# Patient Record
Sex: Male | Born: 1935
Health system: Southern US, Community
[De-identification: ages and names within clinical notes are randomized; demographics above are authoritative.]

## PROBLEM LIST (undated history)

## (undated) DIAGNOSIS — I495 Sick sinus syndrome: Secondary | ICD-10-CM

## (undated) DIAGNOSIS — T7840XA Allergy, unspecified, initial encounter: Secondary | ICD-10-CM

## (undated) DIAGNOSIS — I4891 Unspecified atrial fibrillation: Secondary | ICD-10-CM

## (undated) DIAGNOSIS — C801 Malignant (primary) neoplasm, unspecified: Secondary | ICD-10-CM

## (undated) DIAGNOSIS — I340 Nonrheumatic mitral (valve) insufficiency: Secondary | ICD-10-CM

## (undated) DIAGNOSIS — Z8619 Personal history of other infectious and parasitic diseases: Secondary | ICD-10-CM

## (undated) DIAGNOSIS — G9009 Other idiopathic peripheral autonomic neuropathy: Secondary | ICD-10-CM

## (undated) DIAGNOSIS — I351 Nonrheumatic aortic (valve) insufficiency: Secondary | ICD-10-CM

## (undated) DIAGNOSIS — E785 Hyperlipidemia, unspecified: Secondary | ICD-10-CM

## (undated) DIAGNOSIS — K219 Gastro-esophageal reflux disease without esophagitis: Secondary | ICD-10-CM

## (undated) DIAGNOSIS — I272 Pulmonary hypertension, unspecified: Secondary | ICD-10-CM

## (undated) DIAGNOSIS — I352 Nonrheumatic aortic (valve) stenosis with insufficiency: Secondary | ICD-10-CM

## (undated) DIAGNOSIS — Z950 Presence of cardiac pacemaker: Secondary | ICD-10-CM

## (undated) DIAGNOSIS — M109 Gout, unspecified: Secondary | ICD-10-CM

## (undated) DIAGNOSIS — Z7901 Long term (current) use of anticoagulants: Secondary | ICD-10-CM

## (undated) DIAGNOSIS — I350 Nonrheumatic aortic (valve) stenosis: Secondary | ICD-10-CM

## (undated) DIAGNOSIS — I071 Rheumatic tricuspid insufficiency: Secondary | ICD-10-CM

## (undated) DIAGNOSIS — C4490 Unspecified malignant neoplasm of skin, unspecified: Secondary | ICD-10-CM

## (undated) DIAGNOSIS — H532 Diplopia: Secondary | ICD-10-CM

## (undated) DIAGNOSIS — R42 Dizziness and giddiness: Secondary | ICD-10-CM

## (undated) DIAGNOSIS — I371 Nonrheumatic pulmonary valve insufficiency: Secondary | ICD-10-CM

## (undated) DIAGNOSIS — G4730 Sleep apnea, unspecified: Secondary | ICD-10-CM

## (undated) DIAGNOSIS — Z9889 Other specified postprocedural states: Secondary | ICD-10-CM

## (undated) DIAGNOSIS — I10 Essential (primary) hypertension: Secondary | ICD-10-CM

## (undated) DIAGNOSIS — M1990 Unspecified osteoarthritis, unspecified site: Secondary | ICD-10-CM

## (undated) DIAGNOSIS — I429 Cardiomyopathy, unspecified: Secondary | ICD-10-CM

## (undated) HISTORY — PX: CATARACT EXTRACTION W/ INTRAOCULAR LENS IMPLANT: SHX1309

## (undated) HISTORY — PX: COLONOSCOPY W/ POLYPECTOMY: SHX1380

## (undated) HISTORY — DX: Unspecified osteoarthritis, unspecified site: M19.90

## (undated) HISTORY — PX: COLONOSCOPY: SHX174

## (undated) HISTORY — PX: EYE SURGERY: SHX253

## (undated) HISTORY — DX: Allergy, unspecified, initial encounter: T78.40XA

## (undated) HISTORY — DX: Presence of cardiac pacemaker: Z95.0

## (undated) HISTORY — DX: Gastro-esophageal reflux disease without esophagitis: K21.9

## (undated) HISTORY — DX: Essential (primary) hypertension: I10

## (undated) HISTORY — DX: Other specified postprocedural states: Z98.890

## (undated) HISTORY — DX: Hyperlipidemia, unspecified: E78.5

## (undated) HISTORY — DX: Personal history of other infectious and parasitic diseases: Z86.19

---

## 1982-06-19 DIAGNOSIS — Z9889 Other specified postprocedural states: Secondary | ICD-10-CM

## 1982-06-19 HISTORY — PX: OTHER SURGICAL HISTORY: SHX169

## 1982-06-19 HISTORY — DX: Other specified postprocedural states: Z98.890

## 2003-06-20 HISTORY — PX: REPLACEMENT TOTAL KNEE: SUR1224

## 2008-03-07 ENCOUNTER — Inpatient Hospital Stay: Payer: Self-pay | Admitting: Cardiology

## 2008-03-07 ENCOUNTER — Other Ambulatory Visit: Payer: Self-pay

## 2008-03-10 ENCOUNTER — Other Ambulatory Visit: Payer: Self-pay

## 2008-03-11 ENCOUNTER — Other Ambulatory Visit: Payer: Self-pay

## 2008-03-24 ENCOUNTER — Ambulatory Visit: Payer: Self-pay | Admitting: Internal Medicine

## 2008-04-08 ENCOUNTER — Ambulatory Visit: Payer: Self-pay | Admitting: Internal Medicine

## 2010-06-19 HISTORY — PX: INSERT / REPLACE / REMOVE PACEMAKER: SUR710

## 2012-10-23 ENCOUNTER — Ambulatory Visit: Payer: Self-pay | Admitting: Ophthalmology

## 2013-11-12 DIAGNOSIS — I10 Essential (primary) hypertension: Secondary | ICD-10-CM | POA: Insufficient documentation

## 2013-12-11 ENCOUNTER — Ambulatory Visit: Payer: Self-pay | Admitting: Ophthalmology

## 2013-12-17 ENCOUNTER — Ambulatory Visit: Payer: Self-pay | Admitting: Ophthalmology

## 2014-10-09 NOTE — Op Note (Signed)
PATIENT NAME:  Grant Schmitt, Grant Schmitt MR#:  [id number] DATE OF BIRTH:  1929/02/12  DATE OF PROCEDURE:  10/23/2012  PROCEDURE PERFORMED:  1.  Pars plana vitrectomy of the left eye.  2.  Internal limiting membrane peel of the left eye.   PREOPERATIVE DIAGNOSES:  1.  Epiretinal membrane of the left eye.  2.  Retinal edema of the left eye.   POSTOPERATIVE DIAGNOSES: 1.  Epiretinal membrane of the left eye.  2.  Retinal edema of the left eye.   ESTIMATED BLOOD LOSS: Less than 1 mL.   PRIMARY SURGEON: Garlan Fair, M.D.   ANESTHESIA: Retro block of the left eye with monitored anesthesia care.   COMPLICATIONS: None.   INDICATIONS FOR PROCEDURE: This is a patient who presented to my office with slowly decreasing vision in the left eye to worse than 20/50. Examination revealed an epiretinal membrane with associated retinal edema. Risks, benefits and alternatives of the above procedure were discussed, and the patient wished to proceed.   DETAILS OF PROCEDURE: After informed consent was obtained, the patient was brought into the operative suite at [facility]. The patient was placed in supine position and was given a small dose of Alfenta, and a retrobulbar block was performed on the left eye by the primary surgeon without any complications. The left eye was prepped and draped in sterile manner. After a lid speculum was inserted, a 25-gauge trocar was placed inferotemporally through displaced conjunctiva in an oblique fashion 4 mm beyond the limbus. Infusion cannula was turned on and inserted through the trocar and secured in position with Steri-Strips. Two more trocars were placed in a similar fashion superotemporally and superonasally. The vitreous cutter and light pipe were introduced and a core vitrectomy was performed. The vitreous face was elevated off of the retina using suction. The peripheral vitreous was then trimmed for 360 degrees. Care was taken to avoid striking the  crystalline lens. Indocyanine green was injected onto the posterior pole and removed within 30 seconds. The internal limiting membrane was incised and then peeled for 360 degrees around the fovea for a total diameter of 2 disk diameters. Scleral depressed exam was performed for 360 degrees without any signs of breaks, tears or retinal detachment. A partial air-fluid exchange was performed. The trocars were removed, and the wounds were noted to be airtight. Pressure in the eye was confirmed to be approximately 15 mmHg. Then, 5 mg of dexamethasone was given into the inferior fornix. The lid speculum was removed. The eye was cleaned, and TobraDex was placed in the eye. A patch and shield were placed over the eye, and the patient was taken to [facility] with instructions to remain head up.    ____________________________ Teresa Pelton. Starling Manns, MD mfa:lo D: 10/23/2012 08:57:00 ET T: 10/23/2012 09:05:41 ET JOB#: [id number]  cc: Teresa Pelton. Starling Manns, MD, <Dictator> Coralee Rud MD ELECTRONICALLY SIGNED 11/01/2012 13:05

## 2014-10-10 NOTE — Op Note (Signed)
PATIENT NAME:  Grant Schmitt, Grant Schmitt MR#:  [id number] DATE OF BIRTH:  March 23, 1929  DATE OF PROCEDURE:  12/17/2013  PREOPERATIVE DIAGNOSIS:  Cataract, left eye.    POSTOPERATIVE DIAGNOSIS:  Cataract, left eye.  PROCEDURE PERFORMED:  Extracapsular cataract extraction using phacoemulsification with placement of an Alcon SN60WF, 20.0-diopter posterior chamber lens, serial #[id number].  SURGEON:  Grant Back. Zasha Belleau, MD  ASSISTANT:  None.  ANESTHESIA:  4% lidocaine and 0.75% Marcaine in a 50/50 mixture with 10 units/mL of Hylenex added, given as a peribulbar.  ANESTHESIOLOGIST: Dr. Boston Service.  COMPLICATIONS:  None.  ESTIMATED BLOOD LOSS:  Less than 1 ml.  DESCRIPTION OF PROCEDURE:  The patient was brought to the operating room and given a peribulbar block.  The patient was then prepped and draped in the usual fashion.  The vertical rectus muscles were imbricated using 5-0 silk sutures.  These sutures were then clamped to the sterile drapes as bridle sutures.  A limbal peritomy was performed extending two clock hours and hemostasis was obtained with cautery.  A partial thickness scleral groove was made at the surgical limbus and dissected anteriorly in a lamellar dissection using an Alcon crescent knife.  The anterior chamber was entered supero-temporally with a Superblade and through the lamellar dissection with a 2.6 mm keratome.  DisCoVisc was used to replace the aqueous and a continuous tear capsulorrhexis was carried out.  Hydrodissection and hydrodelineation were carried out with balanced salt and a 27 gauge canula.  The nucleus was rotated to confirm the effectiveness of the hydrodissection.  Phacoemulsification was carried out using a divide-and-conquer technique.  Total ultrasound time was  2 minutes and 23.3 seconds with an average power of  26.7 percent of the CD at 59.49.  Irrigation/aspiration was used to remove the residual cortex.  DisCoVisc was used to inflate the capsule and the  internal incision was enlarged to 3 mm with the crescent knife.  The intraocular lens was folded and inserted into the capsular bag using the AcrySert Delivery System.  Irrigation/aspiration was used to remove the residual DisCoVisc.  Miostat was injected into the anterior chamber through the paracentesis track to inflate the anterior chamber and induce miosis.  The wound was checked for leaks and none were found. The conjunctiva was closed with cautery and the bridle sutures were removed.  Two drops of 0.3% Vigamox were placed on the eye.   An eye shield was placed on the eye.  The patient was discharged to the recovery room in good condition.    ____________________________ Grant Back Jordany Russett, MD sad:ts D: 12/17/2013 11:19:38 ET T: 12/17/2013 13:03:11 ET JOB#: [id number]  cc: Remo Lipps A. Carly Applegate, MD, <Dictator> Martie Lee MD ELECTRONICALLY SIGNED 12/22/2013 13:52

## 2015-09-23 DIAGNOSIS — I10 Essential (primary) hypertension: Secondary | ICD-10-CM | POA: Diagnosis not present

## 2015-09-23 DIAGNOSIS — I495 Sick sinus syndrome: Secondary | ICD-10-CM | POA: Diagnosis not present

## 2015-09-23 DIAGNOSIS — R001 Bradycardia, unspecified: Secondary | ICD-10-CM | POA: Diagnosis not present

## 2015-09-23 DIAGNOSIS — I482 Chronic atrial fibrillation: Secondary | ICD-10-CM | POA: Diagnosis not present

## 2015-09-30 DIAGNOSIS — R001 Bradycardia, unspecified: Secondary | ICD-10-CM | POA: Insufficient documentation

## 2015-10-05 DIAGNOSIS — H3581 Retinal edema: Secondary | ICD-10-CM | POA: Diagnosis not present

## 2016-03-20 DIAGNOSIS — Z950 Presence of cardiac pacemaker: Secondary | ICD-10-CM | POA: Insufficient documentation

## 2016-03-30 DIAGNOSIS — I495 Sick sinus syndrome: Secondary | ICD-10-CM | POA: Diagnosis not present

## 2016-03-30 DIAGNOSIS — I10 Essential (primary) hypertension: Secondary | ICD-10-CM | POA: Diagnosis not present

## 2016-03-30 DIAGNOSIS — E782 Mixed hyperlipidemia: Secondary | ICD-10-CM | POA: Diagnosis not present

## 2016-03-30 DIAGNOSIS — I482 Chronic atrial fibrillation: Secondary | ICD-10-CM | POA: Diagnosis not present

## 2016-03-30 DIAGNOSIS — N400 Enlarged prostate without lower urinary tract symptoms: Secondary | ICD-10-CM | POA: Diagnosis not present

## 2016-04-04 ENCOUNTER — Telehealth: Payer: Self-pay | Admitting: Family Medicine

## 2016-04-04 DIAGNOSIS — H2511 Age-related nuclear cataract, right eye: Secondary | ICD-10-CM | POA: Diagnosis not present

## 2016-04-04 NOTE — Telephone Encounter (Signed)
Please review. Thanks!  

## 2016-04-04 NOTE — Telephone Encounter (Signed)
OK to re-establish 

## 2016-04-04 NOTE — Telephone Encounter (Signed)
Scheduled reestablish appointment/MW

## 2016-04-04 NOTE — Telephone Encounter (Signed)
Pt walked into the office and requesting records ans also stated he would like to get an appointment to reestablish with Dr Caryn Section.  Pt was last seen 04/24/2007.  Pt has be going to the [place] in [place].  Pt states he is being treated by Dr Ubaldo Glassing for high blood pressure.  Pt has [facility] [employer].  CB# [phone]

## 2016-04-25 DIAGNOSIS — I4891 Unspecified atrial fibrillation: Secondary | ICD-10-CM | POA: Insufficient documentation

## 2016-04-25 DIAGNOSIS — M1990 Unspecified osteoarthritis, unspecified site: Secondary | ICD-10-CM | POA: Insufficient documentation

## 2016-04-25 DIAGNOSIS — E7800 Pure hypercholesterolemia, unspecified: Secondary | ICD-10-CM

## 2016-04-25 DIAGNOSIS — K219 Gastro-esophageal reflux disease without esophagitis: Secondary | ICD-10-CM | POA: Insufficient documentation

## 2016-04-25 DIAGNOSIS — I10 Essential (primary) hypertension: Secondary | ICD-10-CM | POA: Insufficient documentation

## 2016-04-26 ENCOUNTER — Ambulatory Visit (INDEPENDENT_AMBULATORY_CARE_PROVIDER_SITE_OTHER): Payer: Medicare Other | Admitting: Family Medicine

## 2016-04-26 ENCOUNTER — Encounter: Payer: Self-pay | Admitting: Family Medicine

## 2016-04-26 VITALS — BP 126/72 | HR 72 | Temp 98.6°F | Resp 16 | Ht 72.0 in | Wt 200.0 lb

## 2016-04-26 DIAGNOSIS — Z8739 Personal history of other diseases of the musculoskeletal system and connective tissue: Secondary | ICD-10-CM | POA: Diagnosis not present

## 2016-04-26 DIAGNOSIS — I10 Essential (primary) hypertension: Secondary | ICD-10-CM

## 2016-04-26 DIAGNOSIS — I4891 Unspecified atrial fibrillation: Secondary | ICD-10-CM | POA: Diagnosis not present

## 2016-04-26 DIAGNOSIS — N400 Enlarged prostate without lower urinary tract symptoms: Secondary | ICD-10-CM | POA: Insufficient documentation

## 2016-04-26 NOTE — Progress Notes (Signed)
Patient: Grant Schmitt    DOB: 1929-03-22   82 y.o.   MRN: [id number] Visit Date: 04/26/2016  Today's Provider: Lelon Huh, MD   Chief Complaint  Patient presents with  . Establish Care   Subjective:    HPI Patient comes in today to establish care with the practice. He was last seen herein2008. Patient reports that he has been going to the [place]. He his also followed by cardiology (Dr. Ubaldo Glassing), who manages his hypertension and pacemaker. Patient feels well today with no other complaints. He states he just had labs done at [facility] and was called by nurse yesterday and told all labs were normal.   He also has history of gout, is on allopurinol, and reports that he has had no gout flares since being on this medications. Denies any adverse effects.     No Known Allergies   Current Outpatient Prescriptions:  .  allopurinol (ZYLOPRIM) 300 MG tablet, Take 300 mg by mouth daily., Disp: , Rfl:  .  lisinopril (PRINIVIL,ZESTRIL) 10 MG tablet, Take 1 tablet by mouth daily., Disp: , Rfl:  .  metoprolol (LOPRESSOR) 50 MG tablet, Take 1 tablet by mouth daily., Disp: , Rfl:  .  simvastatin (ZOCOR) 20 MG tablet, Take 1 tablet by mouth daily., Disp: , Rfl:  .  tamsulosin (FLOMAX) 0.4 MG CAPS capsule, Take 1 capsule by mouth daily., Disp: , Rfl:  .  XARELTO 20 MG TABS tablet, Take 1 tablet by mouth daily., Disp: , Rfl:   Review of Systems  Constitutional: Negative.   HENT: Positive for congestion and rhinorrhea.   Eyes: Negative.   Respiratory: Negative.   Cardiovascular: Negative.   Gastrointestinal: Negative.   Endocrine: Negative.   Genitourinary: Negative.   Musculoskeletal: Negative.   Skin: Negative.   Allergic/Immunologic: Negative.   Neurological: Negative.   Hematological: Negative.   Psychiatric/Behavioral: Negative.     Social History  Substance Use Topics  . Smoking status: Former Smoker    Types: Cigarettes    Quit date: 06/19/1958  . Smokeless tobacco: Never Used    . Alcohol use No   Objective:   BP 126/72 (BP Location: Right Arm, Patient Position: Sitting, Cuff Size: Normal)   Pulse 72   Temp 98.6 F (37 C)   Resp 16   Ht 6' (1.829 m)   Wt 200 lb (90.7 kg)   BMI 27.12 kg/m   Physical Exam  General Appearance:    Alert, cooperative, no distress  Eyes:    PERRL, conjunctiva/corneas clear, EOM's intact       Lungs:     Clear to auscultation bilaterally, respirations unlabored  Heart:    Regular rate and rhythm. II/VI systolic murmur RUSB.   Neurologic:   Awake, alert, oriented x 3. No apparent focal neurological           defect.            Assessment & Plan:     1. History of gout Well controlled on allopurinol  2. Atrial fibrillation, unspecified type ([place]) Asymptomatic. Compliant with medication.  Continue aggressive risk factor modification.  Doing well on Xarelto. Continue routine follow up cardiology.   3. Benign prostatic hyperplasia without lower urinary tract symptoms Well controlled.  Continue current medications.    4. Hypertension Well controlled.  Doing well with current medications.   He is to bring copy of labs recently done at [place].  States he has already had flu vaccine and  both pneumonia vaccines at [facility]

## 2016-06-23 DIAGNOSIS — R0602 Shortness of breath: Secondary | ICD-10-CM | POA: Insufficient documentation

## 2016-07-10 ENCOUNTER — Encounter: Payer: Self-pay | Admitting: Family Medicine

## 2016-07-10 ENCOUNTER — Ambulatory Visit (INDEPENDENT_AMBULATORY_CARE_PROVIDER_SITE_OTHER): Payer: Medicare PPO | Admitting: Family Medicine

## 2016-07-10 VITALS — BP 94/62 | HR 74 | Temp 97.4°F | Resp 16 | Wt 199.2 lb

## 2016-07-10 DIAGNOSIS — L259 Unspecified contact dermatitis, unspecified cause: Secondary | ICD-10-CM | POA: Diagnosis not present

## 2016-07-10 MED ORDER — TRIAMCINOLONE ACETONIDE 0.1 % EX CREA: 1.0000 "application " | TOPICAL_CREAM | Freq: Three times a day (TID) | CUTANEOUS | 0 refills | Status: DC

## 2016-07-10 NOTE — Progress Notes (Signed)
   Patient: Grant Schmitt Male    DOB: Nov 01, 1928   83 y.o.   MRN: [id number] Visit Date: 07/10/2016  Today's Provider: Vernie Murders, PA   Chief Complaint  Patient presents with  . Rash   Subjective:    Rash  This is a new problem. Episode onset: 1 week ago. The problem is unchanged. The affected locations include the chest. The rash is characterized by itchiness, pain and redness. He was exposed to nothing. Past treatments include anti-itch cream. The treatment provided no relief. His past medical history is significant for varicella.   Past Medical History:  Diagnosis Date  . Allergy   . Arthritis   . GERD (gastroesophageal reflux disease)   . H/O hemorrhoidectomy 1984  . History of chickenpox   . Hyperlipidemia   . Hypertension    Past Surgical History:  Procedure Laterality Date  . hemorrhoidectomy  1984  . REPLACEMENT TOTAL KNEE  2005   No family history on file.  No Known Allergies   Previous Medications   ALLOPURINOL (ZYLOPRIM) 300 MG TABLET    Take 300 mg by mouth daily.   LISINOPRIL (PRINIVIL,ZESTRIL) 10 MG TABLET    Take 1 tablet by mouth daily.   METOPROLOL (LOPRESSOR) 50 MG TABLET    Take 1 tablet by mouth daily.   SIMVASTATIN (ZOCOR) 20 MG TABLET    Take 1 tablet by mouth daily.   TAMSULOSIN (FLOMAX) 0.4 MG CAPS CAPSULE    Take 1 capsule by mouth daily.   XARELTO 20 MG TABS TABLET    Take 1 tablet by mouth daily.    Review of Systems  Constitutional: Negative.   Respiratory: Negative.   Cardiovascular: Negative.   Skin: Positive for rash.    Social History  Substance Use Topics  . Smoking status: Former Smoker    Types: Cigarettes    Quit date: 06/19/1958  . Smokeless tobacco: Never Used  . Alcohol use No   Objective:   BP 94/62 (BP Location: Right Arm, Patient Position: Sitting, Cuff Size: Normal)   Pulse 74   Temp 97.4 F (36.3 C) (Oral)   Resp 16   Wt 199 lb 3.2 oz (90.4 kg)   SpO2 99%   BMI 27.02 kg/m   Physical Exam    Constitutional: He is oriented to person, place, and time. He appears well-developed and well-nourished. No distress.  HENT:  Head: Normocephalic and atraumatic.  Right Ear: Hearing normal.  Left Ear: Hearing normal.  Nose: Nose normal.  Eyes: Conjunctivae and lids are normal. Right eye exhibits no discharge. Left eye exhibits no discharge. No scleral icterus.  Pulmonary/Chest: Effort normal. No respiratory distress.  Musculoskeletal: Normal range of motion.  Neurological: He is alert and oriented to person, place, and time.  Skin: Skin is intact. Rash noted. No lesion noted.  Upper chest erythematous pruritic rash. No blisters or scabbed lesions.  Psychiatric: He has a normal mood and affect. His speech is normal and behavior is normal. Thought content normal.      Assessment & Plan:     1. Contact dermatitis, unspecified contact dermatitis type, unspecified trigger Onset over the past week. No cluster of blisters or pain. Only pruritis. Will treat with Triamcinolone cream. Should wash hands frequently to limit exposure to an unknown trigger. Recheck prn. - triamcinolone cream (KENALOG) 0.1 %; Apply 1 application topically 3 (three) times daily.  Dispense: 45 g; Refill: 0

## 2016-07-10 NOTE — Patient Instructions (Signed)
Contact Dermatitis Introduction Dermatitis is redness, soreness, and swelling (inflammation) of the skin. Contact dermatitis is a reaction to certain substances that touch the skin. There are two types of contact dermatitis:  Irritant contact dermatitis. This type is caused by something that irritates your skin, such as dry hands from washing them too much. This type does not require previous exposure to the substance for a reaction to occur. This type is more common.  Allergic contact dermatitis. This type is caused by a substance that you are allergic to, such as a nickel allergy or poison ivy. This type only occurs if you have been exposed to the substance (allergen) before. Upon a repeat exposure, your body reacts to the substance. This type is less common. What are the causes? Many different substances can cause contact dermatitis. Irritant contact dermatitis is most commonly caused by exposure to:  Makeup.  Soaps.  Detergents.  Bleaches.  Acids.  Metal salts, such as nickel. Allergic contact dermatitis is most commonly caused by exposure to:  Poisonous plants.  Chemicals.  Jewelry.  Latex.  Medicines.  Preservatives in products, such as clothing. What increases the risk? This condition is more likely to develop in:  People who have jobs that expose them to irritants or allergens.  People who have certain medical conditions, such as asthma or eczema. What are the signs or symptoms? Symptoms of this condition may occur anywhere on your body where the irritant has touched you or is touched by you. Symptoms include:  Dryness or flaking.  Redness.  Cracks.  Itching.  Pain or a burning feeling.  Blisters.  Drainage of small amounts of blood or clear fluid from skin cracks. With allergic contact dermatitis, there may also be swelling in areas such as the eyelids, mouth, or genitals. How is this diagnosed? This condition is diagnosed with a medical history and  physical exam. A patch skin test may be performed to help determine the cause. If the condition is related to your job, you may need to see an occupational medicine specialist. How is this treated? Treatment for this condition includes figuring out what caused the reaction and protecting your skin from further contact. Treatment may also include:  Steroid creams or ointments. Oral steroid medicines may be needed in more severe cases.  Antibiotics or antibacterial ointments, if a skin infection is present.  Antihistamine lotion or an antihistamine taken by mouth to ease itching.  A bandage (dressing). Follow these instructions at home: [place] your skin as needed.  Apply cool compresses to the affected areas.  Try taking a bath with:  Epsom salts. Follow the instructions on the packaging. You can get these at your local pharmacy or grocery store.  Baking soda. Pour a small amount into the bath as directed by your health care provider.  Colloidal oatmeal. Follow the instructions on the packaging. You can get this at your local pharmacy or grocery store.  Try applying baking soda paste to your skin. Stir water into baking soda until it reaches a paste-like consistency.  Do not scratch your skin.  Bathe less frequently, such as every other day.  Bathe in lukewarm water. Avoid using hot water. Medicines  Take or apply over-the-counter and prescription medicines only as told by your health care provider.  If you were prescribed an antibiotic medicine, take or apply your antibiotic as told by your health care provider. Do not stop using the antibiotic even if your condition starts to improve. General instructions  Keep all follow-up visits as told by your health care provider. This is important.  Avoid the substance that caused your reaction. If you do not know what caused it, keep a journal to try to track what caused it. Write down:  What you eat.  What cosmetic  products you use.  What you drink.  What you wear in the affected area. This includes jewelry.  If you were given a dressing, take care of it as told by your health care provider. This includes when to change and remove it. Contact a health care provider if:  Your condition does not improve with treatment.  Your condition gets worse.  You have signs of infection such as swelling, tenderness, redness, soreness, or warmth in the affected area.  You have a fever.  You have new symptoms. Get help right away if:  You have a severe headache, neck pain, or neck stiffness.  You vomit.  You feel very sleepy.  You notice red streaks coming from the affected area.  Your bone or joint underneath the affected area becomes painful after the skin has healed.  The affected area turns darker.  You have difficulty breathing. This information is not intended to replace advice given to you by your health care provider. Make sure you discuss any questions you have with your health care provider. Document Released: 06/02/2000 Document Revised: 11/11/2015 Document Reviewed: 10/21/2014  2017 [employer]

## 2016-08-23 LAB — BASIC METABOLIC PANEL
BUN: 23 mg/dL — AB (ref 4–21)
Creatinine: 1 mg/dL (ref 0.6–1.3)
GLUCOSE: 83 mg/dL
POTASSIUM: 4.5 mmol/L (ref 3.4–5.3)
Sodium: 139 mmol/L (ref 137–147)

## 2016-08-23 LAB — CBC AND DIFFERENTIAL
HCT: 38 % — AB (ref 41–53)
HEMOGLOBIN: 12.5 g/dL — AB (ref 13.5–17.5)
PLATELETS: 168 10*3/uL (ref 150–399)
WBC: 6.4 10*3/mL

## 2016-08-23 LAB — HEPATIC FUNCTION PANEL
ALT: 48 U/L — AB (ref 10–40)
AST: 35 U/L (ref 14–40)

## 2016-09-01 ENCOUNTER — Ambulatory Visit (INDEPENDENT_AMBULATORY_CARE_PROVIDER_SITE_OTHER): Payer: Medicare PPO | Admitting: Family Medicine

## 2016-09-01 ENCOUNTER — Ambulatory Visit (INDEPENDENT_AMBULATORY_CARE_PROVIDER_SITE_OTHER): Payer: Medicare PPO

## 2016-09-01 ENCOUNTER — Encounter: Payer: Self-pay | Admitting: Family Medicine

## 2016-09-01 VITALS — BP 114/72 | HR 80 | Temp 97.8°F | Wt 198.0 lb

## 2016-09-01 VITALS — BP 114/72 | HR 80 | Temp 97.8°F | Ht 72.0 in | Wt 198.2 lb

## 2016-09-01 DIAGNOSIS — N400 Enlarged prostate without lower urinary tract symptoms: Secondary | ICD-10-CM

## 2016-09-01 DIAGNOSIS — I4891 Unspecified atrial fibrillation: Secondary | ICD-10-CM

## 2016-09-01 DIAGNOSIS — Z0000 Encounter for general adult medical examination without abnormal findings: Secondary | ICD-10-CM | POA: Diagnosis not present

## 2016-09-01 DIAGNOSIS — Z950 Presence of cardiac pacemaker: Secondary | ICD-10-CM | POA: Diagnosis not present

## 2016-09-01 DIAGNOSIS — Z8739 Personal history of other diseases of the musculoskeletal system and connective tissue: Secondary | ICD-10-CM | POA: Diagnosis not present

## 2016-09-01 DIAGNOSIS — E7800 Pure hypercholesterolemia, unspecified: Secondary | ICD-10-CM

## 2016-09-01 DIAGNOSIS — I10 Essential (primary) hypertension: Secondary | ICD-10-CM

## 2016-09-01 MED ORDER — TAMSULOSIN HCL 0.4 MG PO CAPS: 0.4000 mg | ORAL_CAPSULE | Freq: Every day | ORAL | 3 refills | Status: DC

## 2016-09-01 MED ORDER — SIMVASTATIN 20 MG PO TABS: 20.0000 mg | ORAL_TABLET | Freq: Every day | ORAL | 3 refills | Status: DC

## 2016-09-01 MED ORDER — LISINOPRIL 10 MG PO TABS: 10.0000 mg | ORAL_TABLET | Freq: Every day | ORAL | 3 refills | Status: DC

## 2016-09-01 MED ORDER — ALLOPURINOL 300 MG PO TABS: 300.0000 mg | ORAL_TABLET | Freq: Every day | ORAL | 3 refills | Status: DC

## 2016-09-01 MED ORDER — METOPROLOL TARTRATE 50 MG PO TABS: 50.0000 mg | ORAL_TABLET | Freq: Every day | ORAL | 3 refills | Status: DC

## 2016-09-01 NOTE — Patient Instructions (Signed)
Grant Schmitt , Thank you for taking time to come for your Medicare Wellness Visit. I appreciate your ongoing commitment to your health goals. Please review the following plan we discussed and let me know if I can assist you in the future.   Screening recommendations/referrals: Colonoscopy: N/A Recommended yearly ophthalmology/optometry visit for glaucoma screening and checkup Recommended yearly dental visit for hygiene and checkup  Vaccinations: Influenza vaccine - completed Pneumococcal vaccine - need to check [facility] records first Tdap vaccine - declined Shingles vaccine - completed  Advanced directives: Requested copy for office   Next appointment: None.  Preventive Care 36 Years and Older, Male Preventive care refers to lifestyle choices and visits with your health care provider that can promote health and wellness. What does preventive care include?  A yearly physical exam. This is also called an annual well check.  Dental exams once or twice a year.  Routine eye exams. Ask your health care provider how often you should have your eyes checked.  Personal lifestyle choices, including:  Daily care of your teeth and gums.  Regular physical activity.  Eating a healthy diet.  Avoiding tobacco and drug use.  Limiting alcohol use.  Practicing safe sex.  Taking low doses of aspirin every day.  Taking vitamin and mineral supplements as recommended by your health care provider. What happens during an annual well check? The services and screenings done by your health care provider during your annual well check will depend on your age, overall health, lifestyle risk factors, and family history of disease. Counseling  Your health care provider may ask you questions about your:  Alcohol use.  Tobacco use.  Drug use.  Emotional well-being.  Home and relationship well-being.  Sexual activity.  Eating habits.  History of falls.  Memory and ability to understand  (cognition).  Work and work Statistician. Screening  You may have the following tests or measurements:  Height, weight, and BMI.  Blood pressure.  Lipid and cholesterol levels. These may be checked every 5 years, or more frequently if you are over 71 years old.  Skin check.  Lung cancer screening. You may have this screening every year starting at age 34 if you have a 30-pack-year history of smoking and currently smoke or have quit within the past 15 years.  Fecal occult blood test (FOBT) of the stool. You may have this test every year starting at age 51.  Flexible sigmoidoscopy or colonoscopy. You may have a sigmoidoscopy every 5 years or a colonoscopy every 10 years starting at age 69.  Prostate cancer screening. Recommendations will vary depending on your family history and other risks.  Hepatitis C blood test.  Hepatitis B blood test.  Sexually transmitted disease (STD) testing.  Diabetes screening. This is done by checking your blood sugar (glucose) after you have not eaten for a while (fasting). You may have this done every 1-3 years.  Abdominal aortic aneurysm (AAA) screening. You may need this if you are a current or former smoker.  Osteoporosis. You may be screened starting at age 80 if you are at high risk. Talk with your health care provider about your test results, treatment options, and if necessary, the need for more tests. Vaccines  Your health care provider may recommend certain vaccines, such as:  Influenza vaccine. This is recommended every year.  Tetanus, diphtheria, and acellular pertussis (Tdap, Td) vaccine. You may need a Td booster every 10 years.  Zoster vaccine. You may need this after age 69.  Pneumococcal 13-valent conjugate (PCV13) vaccine. One dose is recommended after age 48.  Pneumococcal polysaccharide (PPSV23) vaccine. One dose is recommended after age 61. Talk to your health care provider about which screenings and vaccines you need and  how often you need them. This information is not intended to replace advice given to you by your health care provider. Make sure you discuss any questions you have with your health care provider. Document Released: 07/02/2015 Document Revised: 02/23/2016 Document Reviewed: 04/06/2015 Elsevier Interactive Patient Education  2017 [place] Prevention in the Home Falls can cause injuries. They can happen to people of all ages. There are many things you can do to make your home safe and to help prevent falls. What can I do on the outside of my home?  Regularly fix the edges of walkways and driveways and fix any cracks.  Remove anything that might make you trip as you walk through a door, such as a raised step or threshold.  Trim any bushes or trees on the path to your home.  Use bright outdoor lighting.  Clear any walking paths of anything that might make someone trip, such as rocks or tools.  Regularly check to see if handrails are loose or broken. Make sure that both sides of any steps have handrails.  Any raised decks and porches should have guardrails on the edges.  Have any leaves, snow, or ice cleared regularly.  Use sand or salt on walking paths during winter.  Clean up any spills in your garage right away. This includes oil or grease spills. What can I do in the bathroom?  Use night lights.  Install grab bars by the toilet and in the tub and shower. Do not use towel bars as grab bars.  Use non-skid mats or decals in the tub or shower.  If you need to sit down in the shower, use a plastic, non-slip stool.  Keep the floor dry. Clean up any water that spills on the floor as soon as it happens.  Remove soap buildup in the tub or shower regularly.  Attach bath mats securely with double-sided non-slip rug tape.  Do not have throw rugs and other things on the floor that can make you trip. What can I do in the bedroom?  Use night lights.  Make sure that you  have a light by your bed that is easy to reach.  Do not use any sheets or blankets that are too big for your bed. They should not hang down onto the floor.  Have a firm chair that has side arms. You can use this for support while you get dressed.  Do not have throw rugs and other things on the floor that can make you trip. What can I do in the kitchen?  Clean up any spills right away.  Avoid walking on wet floors.  Keep items that you use a lot in easy-to-reach places.  If you need to reach something above you, use a strong step stool that has a grab bar.  Keep electrical cords out of the way.  Do not use floor polish or wax that makes floors slippery. If you must use wax, use non-skid floor wax.  Do not have throw rugs and other things on the floor that can make you trip. What can I do with my stairs?  Do not leave any items on the stairs.  Make sure that there are handrails on both sides of the stairs and use them.  Fix handrails that are broken or loose. Make sure that handrails are as long as the stairways.  Check any carpeting to make sure that it is firmly attached to the stairs. Fix any carpet that is loose or worn.  Avoid having throw rugs at the top or bottom of the stairs. If you do have throw rugs, attach them to the floor with carpet tape.  Make sure that you have a light switch at the top of the stairs and the bottom of the stairs. If you do not have them, ask someone to add them for you. What else can I do to help prevent falls?  Wear shoes that:  Do not have high heels.  Have rubber bottoms.  Are comfortable and fit you well.  Are closed at the toe. Do not wear sandals.  If you use a stepladder:  Make sure that it is fully opened. Do not climb a closed stepladder.  Make sure that both sides of the stepladder are locked into place.  Ask someone to hold it for you, if possible.  Clearly mark and make sure that you can see:  Any grab bars or  handrails.  First and last steps.  Where the edge of each step is.  Use tools that help you move around (mobility aids) if they are needed. These include:  Canes.  Walkers.  Scooters.  Crutches.  Turn on the lights when you go into a dark area. Replace any light bulbs as soon as they burn out.  Set up your furniture so you have a clear path. Avoid moving your furniture around.  If any of your floors are uneven, fix them.  If there are any pets around you, be aware of where they are.  Review your medicines with your doctor. Some medicines can make you feel dizzy. This can increase your chance of falling. Ask your doctor what other things that you can do to help prevent falls. This information is not intended to replace advice given to you by your health care provider. Make sure you discuss any questions you have with your health care provider. Document Released: 04/01/2009 Document Revised: 11/11/2015 Document Reviewed: 07/10/2014 Elsevier Interactive Patient Education  2017 [employer].

## 2016-09-01 NOTE — Progress Notes (Signed)
Patient: Grant Schmitt, Male    DOB: 1929-04-06, 83 y.o.   MRN: [id number] Visit Date: 09/01/2016  Today's Provider: Vernie Murders, PA   Chief Complaint  Patient presents with  . Annual Exam   Subjective:    Annual physical exam Grant Schmitt is a 83 y.o. male who presents today for health maintenance and complete physical. He feels well. He reports exercising daily. He reports he is sleeping well.  ----------------------------------------------------------------- Pt is here for a physical after seeing the health nurse advisor for his AWV. Pt declines updating vaccines because he prefers to check his vaccination records from the [place]. Pt gets his medications prescribed the the [place]. Pt would like PCP to start prescribing medications so he can start using [facility]; pt reports the [place] is prescribing his medications locally only.  Pt is NOT fasting.  Review of Systems  Constitutional: Negative.   HENT: Positive for rhinorrhea. Negative for congestion, dental problem, drooling, ear discharge, ear pain, facial swelling, hearing loss, mouth sores, nosebleeds, postnasal drip, sinus pain, sinus pressure, sneezing, sore throat, tinnitus, trouble swallowing and voice change.   Eyes: Positive for visual disturbance. Negative for photophobia, pain, discharge, redness and itching.  Respiratory: Negative.   Cardiovascular: Negative.   Gastrointestinal: Negative.   Endocrine: Positive for polyuria. Negative for cold intolerance, heat intolerance, polydipsia and polyphagia.  Genitourinary: Negative.   Musculoskeletal: Positive for arthralgias. Negative for back pain, gait problem, joint swelling, myalgias, neck pain and neck stiffness.  Skin: Negative.   Allergic/Immunologic: Negative.   Neurological: Negative.   Hematological: Negative.   Psychiatric/Behavioral: Positive for sleep disturbance. Negative for agitation, behavioral problems, confusion, decreased  concentration, dysphoric mood, hallucinations, self-injury and suicidal ideas. The patient is not nervous/anxious and is not hyperactive.     Social History      He  reports that he quit smoking about 58 years ago. His smoking use included Cigarettes. He has never used smokeless tobacco. He reports that he does not drink alcohol or use drugs.       Social History   Social History  . Marital status: Widowed    Spouse name: N/A  . Number of children: N/A  . Years of education: 1   Occupational History  . Retired    Social History Main Topics  . Smoking status: Former Smoker    Types: Cigarettes    Quit date: 06/19/1958  . Smokeless tobacco: Never Used  . Alcohol use No  . Drug use: No  . Sexual activity: Not on file   Other Topics Concern  . Not on file   Social History Narrative   Lives at [facility]    Past Medical History:  Diagnosis Date  . Allergy   . Arthritis   . GERD (gastroesophageal reflux disease)   . H/O hemorrhoidectomy 1984  . History of chickenpox   . Hyperlipidemia   . Hypertension      Patient Active Problem List   Diagnosis Date Noted  . SOB (shortness of breath) 06/23/2016  . History of gout 04/26/2016  . BPH (benign prostatic hyperplasia) 04/26/2016  . Arthritis 04/25/2016  . Hypertension 04/25/2016  . Atrial fibrillation ([place]) 04/25/2016  . GERD (gastroesophageal reflux disease) 04/25/2016  . Hypercholesterolemia 04/25/2016  . Cardiac pacemaker 03/20/2016  . Symptomatic bradycardia 09/30/2015  . Benign essential hypertension 11/12/2013    Past Surgical History:  Procedure Laterality Date  . hemorrhoidectomy  1984  . REPLACEMENT TOTAL KNEE  2005  Family History        Family Status  Relation Status  . Mother Deceased at age 29  . Father Deceased at age 88        His family history is not on file.     No Known Allergies   Current Outpatient Prescriptions:  .  allopurinol (ZYLOPRIM) 300 MG tablet, Take 300 mg by mouth  daily., Disp: , Rfl:  .  Cholecalciferol (VITAMIN D3) 2000 units TABS, Take 1 tablet by mouth daily., Disp: , Rfl:  .  lisinopril (PRINIVIL,ZESTRIL) 10 MG tablet, Take 1 tablet by mouth daily., Disp: , Rfl:  .  metoprolol (LOPRESSOR) 50 MG tablet, Take 1 tablet by mouth daily., Disp: , Rfl:  .  simvastatin (ZOCOR) 20 MG tablet, Take 1 tablet by mouth daily., Disp: , Rfl:  .  tamsulosin (FLOMAX) 0.4 MG CAPS capsule, Take 1 capsule by mouth daily., Disp: , Rfl:  .  triamcinolone cream (KENALOG) 0.1 %, Apply 1 application topically 3 (three) times daily. (Patient not taking: Reported on 09/01/2016), Disp: 45 g, Rfl: 0 .  vitamin B-12 (CYANOCOBALAMIN) 1000 MCG tablet, Take 1,000 mcg by mouth daily., Disp: , Rfl:  .  XARELTO 20 MG TABS tablet, Take 1 tablet by mouth daily., Disp: , Rfl:    Patient Care Team: Birdie Sons, MD as PCP - General (Family Medicine) Teodoro Spray, MD as Consulting Physician (Cardiology) Estill Cotta, MD as Consulting Physician (Ophthalmology) Margo Common, PA as Consulting Physician (Family Medicine) Will Bonnet, MD as Referring Physician (Internal Medicine)      Objective:   Vitals: BP 114/72   Pulse 80   Temp 97.8 F (36.6 C) (Oral)   Wt 198 lb (89.8 kg)   BMI 26.85 kg/m    Vitals:   09/01/16 1008  BP: 114/72  Pulse: 80  Temp: 97.8 F (36.6 C)  TempSrc: Oral  Weight: 198 lb (89.8 kg)     Physical Exam  Constitutional: He is oriented to person, place, and time. He appears well-developed and well-nourished.  HENT:  Head: Normocephalic and atraumatic.  Right Ear: External ear normal.  Left Ear: External ear normal.  Nose: Nose normal.  Mouth/Throat: Oropharynx is clear and moist.  Eyes: Conjunctivae and EOM are normal. Pupils are equal, round, and reactive to light. Right eye exhibits no discharge.  Neck: Normal range of motion. Neck supple. No tracheal deviation present. No thyromegaly present.  Cardiovascular: Normal rate,  regular rhythm, normal heart sounds and intact distal pulses.   No murmur heard. Pulmonary/Chest: Effort normal and breath sounds normal. No respiratory distress. He has no wheezes. He has no rales. He exhibits no tenderness.  Abdominal: Soft. He exhibits no distension and no mass. There is no tenderness. There is no rebound and no guarding.  Genitourinary:  Genitourinary Comments: Examined by urologist at the [facility] last week.  Musculoskeletal: Normal range of motion. He exhibits no edema or tenderness.  Some crepitus in the left knee. Well healed scar from right knee replacement.  Lymphadenopathy:    He has no cervical adenopathy.  Neurological: He is alert and oriented to person, place, and time. He has normal reflexes. No cranial nerve deficit. He exhibits normal muscle tone. Coordination normal.  Skin: Skin is warm and dry. No rash noted. No erythema.  Psychiatric: He has a normal mood and affect. His behavior is normal. Judgment and thought content normal.   Depression Screen PHQ 2/9 Scores 09/01/2016  PHQ - 2 Score  0  PHQ- 9 Score 1    Assessment & Plan:     Routine Health Maintenance and Physical Exam  Exercise Activities and Dietary recommendations Goals    . Increase water intake          Recommend increasing water intake to 4 glasses daily.        Immunization History  Administered Date(s) Administered  . Influenza-Unspecified 02/05/2012, 03/22/2014, 03/15/2015, 03/31/2016    Health Maintenance  Topic Date Due  . PNA vac Low Risk Adult (1 of 2 - PCV13) 08/17/2017 (Originally 04/16/1996)  . TETANUS/TDAP  08/18/2026 (Originally 04/16/1948)  . INFLUENZA VACCINE  Completed     Discussed health benefits of physical activity, and encouraged him to engage in regular exercise appropriate for his age and condition.    -------------------------------------------------------------------- 1. Benign essential hypertension Well controlled with Metoprolol and Lisinopril  without side effects. No chest pains, palpitations, dizziness, edema or dyspnea. Wants to get medications written to send into a mailorder pharmacy for lower cost. Medicare Annual Wellness screening today in good shape. Wants to postpone any immunizations until he can check with the [facility] where he has had recent labs and follow up. - metoprolol (LOPRESSOR) 50 MG tablet; Take 1 tablet (50 mg total) by mouth daily.  Dispense: 90 tablet; Refill: 3 - lisinopril (PRINIVIL,ZESTRIL) 10 MG tablet; Take 1 tablet (10 mg total) by mouth daily.  Dispense: 90 tablet; Refill: 3  2. Atrial fibrillation, unspecified type ([place]) Well controlled with regular use of Metoprolol and pacemaker. Dr. Ubaldo Glassing (cardiologist) presently has him on Xarelto 20 mg qd instead of Coumadin. Continue follow up with Dr. Ubaldo Glassing q 6 months. - metoprolol (LOPRESSOR) 50 MG tablet; Take 1 tablet (50 mg total) by mouth daily.  Dispense: 90 tablet; Refill: 3  3. Benign prostatic hyperplasia without lower urinary tract symptoms Has had annual evaluation by urologist at the [facility] but wants to get medications through a mail order pharmacy because it will be less expensive. Wrote prescriptions to be sent to the [place]. Request copy of recheck PSA. - tamsulosin (FLOMAX) 0.4 MG CAPS capsule; Take 1 capsule (0.4 mg total) by mouth daily.  Dispense: 90 capsule; Refill: 3  4. Hypercholesterolemia Tolerating Simvastatin without side effects. Continues low fat diet and will get copy of recent labs at [facility] for review. Refilled medication. - simvastatin (ZOCOR) 20 MG tablet; Take 1 tablet (20 mg total) by mouth daily.  Dispense: 90 tablet; Refill: 3  5. History of gout No acute attacks in a couple years. Still taking the Allopurinol as prescribed at the [facility]. Want to change to this PCP prescribing chronic medications because cost is less. Had labs at last [facility] visit last month and will get a copy of reports. - allopurinol (ZYLOPRIM) 300 MG tablet; Take 1  tablet (300 mg total) by mouth daily.  Dispense: 90 tablet; Refill: 3  6. Cardiac pacemaker Placed in left upper chest 10 years ago for sick sinus syndrome and a.fib/SA Node Dysfunction. Followed every 6 months by Dr.Fath (cardiologist - last evaluation was 06-22-16).     Vernie Murders, PA  [facility]

## 2016-09-01 NOTE — Progress Notes (Addendum)
Subjective:   Grant Schmitt is a 83 y.o. male who presents for Medicare Annual/Subsequent preventive examination.  Review of Systems:  N/A  Cardiac Risk Factors include: advanced age (>26men, >72 women);dyslipidemia;hypertension;male gender     Objective:    Vitals: BP 114/72 (BP Location: Right Arm)   Pulse 80   Temp 97.8 F (36.6 C) (Oral)   Ht 6' (1.829 m)   Wt 198 lb 3.2 oz (89.9 kg)   BMI 26.88 kg/m   Body mass index is 26.88 kg/m.  Tobacco History  Smoking Status  . Former Smoker  . Types: Cigarettes  . Quit date: 06/19/1958  Smokeless Tobacco  . Never Used     Counseling given: Not Answered   Past Medical History:  Diagnosis Date  . Allergy   . Arthritis   . GERD (gastroesophageal reflux disease)   . H/O hemorrhoidectomy 1984  . History of chickenpox   . Hyperlipidemia   . Hypertension    Past Surgical History:  Procedure Laterality Date  . hemorrhoidectomy  1984  . REPLACEMENT TOTAL KNEE  2005   History reviewed. No pertinent family history. History  Sexual Activity  . Sexual activity: Not on file    Outpatient Encounter Prescriptions as of 09/01/2016  Medication Sig  . allopurinol (ZYLOPRIM) 300 MG tablet Take 300 mg by mouth daily.  . Cholecalciferol (VITAMIN D3) 2000 units TABS Take 1 tablet by mouth daily.  Marland Kitchen lisinopril (PRINIVIL,ZESTRIL) 10 MG tablet Take 1 tablet by mouth daily.  . metoprolol (LOPRESSOR) 50 MG tablet Take 1 tablet by mouth daily.  . simvastatin (ZOCOR) 20 MG tablet Take 1 tablet by mouth daily.  . tamsulosin (FLOMAX) 0.4 MG CAPS capsule Take 1 capsule by mouth daily.  . vitamin B-12 (CYANOCOBALAMIN) 1000 MCG tablet Take 1,000 mcg by mouth daily.  Alveda Reasons 20 MG TABS tablet Take 1 tablet by mouth daily.  Marland Kitchen triamcinolone cream (KENALOG) 0.1 % Apply 1 application topically 3 (three) times daily. (Patient not taking: Reported on 09/01/2016)   No facility-administered encounter medications on file as of 09/01/2016.      Activities of Daily Living In your present state of health, do you have any difficulty performing the following activities: 09/01/2016  Hearing? N  Vision? N  Difficulty concentrating or making decisions? N  Walking or climbing stairs? N  Dressing or bathing? N  Doing errands, shopping? N  Preparing Food and eating ? N  Using the Toilet? N  In the past six months, have you accidently leaked urine? Y  Do you have problems with loss of bowel control? N  Managing your Medications? N  Managing your Finances? N  Housekeeping or managing your Housekeeping? N  Some recent data might be hidden    Patient Care Team: Birdie Sons, MD as PCP - General (Family Medicine) Teodoro Spray, MD as Consulting Physician (Cardiology) Estill Cotta, MD as Consulting Physician (Ophthalmology) Margo Common, PA as Consulting Physician (Family Medicine) Will Bonnet, MD as Referring Physician (Internal Medicine)   Assessment:    Exercise Activities and Dietary recommendations Current Exercise Habits: Structured exercise class, Type of exercise: Other - see comments;walking (stationary bicycle), Time (Minutes): 30, Frequency (Times/Week): 3 (to 4 days), Weekly Exercise (Minutes/Week): 90, Intensity: Mild, Exercise limited by: None identified  Goals    . Increase water intake          Recommend increasing water intake to 4 glasses daily.       Fall Risk Fall  Risk  09/01/2016  Falls in the past year? No   Depression Screen PHQ 2/9 Scores 09/01/2016  PHQ - 2 Score 0  PHQ- 9 Score 1    Cognitive Function     6CIT Screen 09/01/2016  What Year? 0 points  What month? 0 points  What time? 0 points  Count back from 20 0 points  Months in reverse 0 points  Repeat phrase 0 points  Total Score 0    Immunization History  Administered Date(s) Administered  . Influenza-Unspecified 02/05/2012, 03/22/2014, 03/15/2015, 03/31/2016   Screening Tests Health Maintenance  Topic Date  Due  . PNA vac Low Risk Adult (1 of 2 - PCV13) 08/17/2017 (Originally 04/16/1996)  . TETANUS/TDAP  08/18/2026 (Originally 04/16/1948)  . INFLUENZA VACCINE  Completed      Plan:  I have personally reviewed and addressed the Medicare Annual Wellness questionnaire and have noted the following in the patient's chart:  A. Medical and social history B. Use of alcohol, tobacco or illicit drugs  C. Current medications and supplements D. Functional ability and status E.  Nutritional status F.  Physical activity G. Advance directives H. List of other physicians I.  Hospitalizations, surgeries, and ER visits in previous 12 months J.  [place] such as hearing and vision if needed, cognitive and depression L. Referrals and appointments - none  In addition, I have reviewed and discussed with patient certain preventive protocols, quality metrics, and best practice recommendations. A written personalized care plan for preventive services as well as general preventive health recommendations were provided to patient.  See attached scanned questionnaire for additional information.   Signed,  Fabio Neighbors, LPN Nurse Health Advisor   MD Recommendations: None. Pt declined tetanus and pneumonia vaccine due to wanting to check previous [facility] records first.   Have reviewed the Health Advisor's note and agree with documentation and plan. Reviewed finding with patient today.

## 2016-10-25 ENCOUNTER — Encounter: Payer: Self-pay | Admitting: Family Medicine

## 2016-10-27 ENCOUNTER — Encounter: Payer: Self-pay | Admitting: Family Medicine

## 2016-11-30 ENCOUNTER — Other Ambulatory Visit: Payer: Self-pay | Admitting: *Deleted

## 2016-11-30 DIAGNOSIS — Z8739 Personal history of other diseases of the musculoskeletal system and connective tissue: Secondary | ICD-10-CM

## 2016-11-30 DIAGNOSIS — I10 Essential (primary) hypertension: Secondary | ICD-10-CM

## 2016-11-30 DIAGNOSIS — E7800 Pure hypercholesterolemia, unspecified: Secondary | ICD-10-CM

## 2016-11-30 DIAGNOSIS — N400 Enlarged prostate without lower urinary tract symptoms: Secondary | ICD-10-CM

## 2016-11-30 DIAGNOSIS — I4891 Unspecified atrial fibrillation: Secondary | ICD-10-CM

## 2016-11-30 MED ORDER — METOPROLOL TARTRATE 50 MG PO TABS: 50.0000 mg | ORAL_TABLET | Freq: Every day | ORAL | 4 refills | Status: DC

## 2016-11-30 MED ORDER — XARELTO 20 MG PO TABS: 20.0000 mg | ORAL_TABLET | Freq: Every day | ORAL | 4 refills | Status: AC

## 2016-11-30 MED ORDER — ALLOPURINOL 300 MG PO TABS: 300.0000 mg | ORAL_TABLET | Freq: Every day | ORAL | 4 refills | Status: DC

## 2016-11-30 MED ORDER — LISINOPRIL 10 MG PO TABS: 10.0000 mg | ORAL_TABLET | Freq: Every day | ORAL | 4 refills | Status: DC

## 2016-11-30 MED ORDER — SIMVASTATIN 20 MG PO TABS: 20.0000 mg | ORAL_TABLET | Freq: Every day | ORAL | 4 refills | Status: DC

## 2016-11-30 MED ORDER — TAMSULOSIN HCL 0.4 MG PO CAPS: 0.4000 mg | ORAL_CAPSULE | Freq: Every day | ORAL | 4 refills | Status: DC

## 2017-06-20 ENCOUNTER — Other Ambulatory Visit: Payer: Self-pay | Admitting: Family Medicine

## 2017-06-20 DIAGNOSIS — I4891 Unspecified atrial fibrillation: Secondary | ICD-10-CM

## 2017-06-20 DIAGNOSIS — I10 Essential (primary) hypertension: Secondary | ICD-10-CM

## 2017-06-20 DIAGNOSIS — E7800 Pure hypercholesterolemia, unspecified: Secondary | ICD-10-CM

## 2017-07-18 ENCOUNTER — Other Ambulatory Visit: Payer: Self-pay | Admitting: Family Medicine

## 2017-07-18 DIAGNOSIS — Z8739 Personal history of other diseases of the musculoskeletal system and connective tissue: Secondary | ICD-10-CM

## 2017-07-18 NOTE — Telephone Encounter (Signed)
Please review. Thanks!  

## 2017-08-13 ENCOUNTER — Other Ambulatory Visit: Payer: Self-pay | Admitting: Family Medicine

## 2017-08-13 DIAGNOSIS — N400 Enlarged prostate without lower urinary tract symptoms: Secondary | ICD-10-CM

## 2017-08-22 ENCOUNTER — Other Ambulatory Visit: Payer: Self-pay | Admitting: Family Medicine

## 2017-08-22 DIAGNOSIS — I4891 Unspecified atrial fibrillation: Secondary | ICD-10-CM

## 2017-08-22 DIAGNOSIS — E7800 Pure hypercholesterolemia, unspecified: Secondary | ICD-10-CM

## 2017-08-22 DIAGNOSIS — I10 Essential (primary) hypertension: Secondary | ICD-10-CM

## 2017-09-06 ENCOUNTER — Ambulatory Visit (INDEPENDENT_AMBULATORY_CARE_PROVIDER_SITE_OTHER): Payer: Medicare PPO

## 2017-09-06 VITALS — BP 124/68 | HR 72 | Ht 72.0 in | Wt 216.6 lb

## 2017-09-06 DIAGNOSIS — Z0000 Encounter for general adult medical examination without abnormal findings: Secondary | ICD-10-CM | POA: Diagnosis not present

## 2017-09-06 NOTE — Progress Notes (Signed)
Subjective:   Grant Schmitt is a 84 y.o. male who presents for Medicare Annual/Subsequent preventive examination.  Review of Systems:  N/A  Cardiac Risk Factors include: advanced age (>61men, >44 women);dyslipidemia;hypertension;male gender     Objective:    Vitals: BP 124/68 (BP Location: Left Arm)   Pulse 72   Ht 6' (1.829 m)   Wt 216 lb 9.6 oz (98.2 kg)   BMI 29.38 kg/m   Body mass index is 29.38 kg/m.  Advanced Directives 09/06/2017 09/01/2016  Does Patient Have a Medical Advance Directive? Yes Yes  Type of Paramedic of [place];Living will Living will;Healthcare Power of [place] in Chart? No - copy requested No - copy requested    Tobacco Social History   Tobacco Use  Smoking Status Former Smoker  . Types: Cigarettes  . Last attempt to quit: 06/19/1958  . Years since quitting: 59.2  Smokeless Tobacco Never Used     Counseling given: Not Answered   Clinical Intake:  Pre-visit preparation completed: Yes  Pain : No/denies pain Pain Score: 0-No pain     Nutritional Status: BMI 25 -29 Overweight Nutritional Risks: None Diabetes: No  How often do you need to have someone help you when you read instructions, pamphlets, or other written materials from your doctor or pharmacy?: 1 - Never  Interpreter Needed?: No  Information entered by :: [facility], LPN  Past Medical History:  Diagnosis Date  . Allergy   . Arthritis   . GERD (gastroesophageal reflux disease)   . H/O hemorrhoidectomy 1984  . History of chickenpox   . Hyperlipidemia   . Hypertension   . Pacemaker    Past Surgical History:  Procedure Laterality Date  . hemorrhoidectomy  1984  . REPLACEMENT TOTAL KNEE  2005   History reviewed. No pertinent family history. Social History   Socioeconomic History  . Marital status: Widowed    Spouse name: Not on file  . Number of children: 2  . Years of education: 82  . Highest  education level: 12th grade  Occupational History  . Occupation: Retired  Scientific laboratory technician  . Financial resource strain: Not hard at all  . Food insecurity:    Worry: Never true    Inability: Never true  . Transportation needs:    Medical: No    Non-medical: No  Tobacco Use  . Smoking status: Former Smoker    Types: Cigarettes    Last attempt to quit: 06/19/1958    Years since quitting: 59.2  . Smokeless tobacco: Never Used  Substance and Sexual Activity  . Alcohol use: Yes    Alcohol/week: 0.6 - 1.2 oz    Types: 1 - 2 Glasses of wine per week  . Drug use: No  . Sexual activity: Not on file  Lifestyle  . Physical activity:    Days per week: Not on file    Minutes per session: Not on file  . Stress: Not at all  Relationships  . Social connections:    Talks on phone: Not on file    Gets together: Not on file    Attends religious service: Not on file    Active member of club or organization: Not on file    Attends meetings of clubs or organizations: Not on file    Relationship status: Not on file  Other Topics Concern  . Not on file  Social History Narrative   Lives at [facility]  Outpatient Encounter Medications as of 09/06/2017  Medication Sig  . allopurinol (ZYLOPRIM) 300 MG tablet TAKE 1 TABLET EVERY DAY  . Cholecalciferol (VITAMIN D3) 2000 units TABS Take 1 tablet by mouth daily.  Marland Kitchen lisinopril (PRINIVIL,ZESTRIL) 10 MG tablet TAKE 1 TABLET EVERY DAY (NEED MD APPOINTMENT IN 2 TO 3 MONTHS FOR REFILLS)  . metoprolol tartrate (LOPRESSOR) 50 MG tablet TAKE 1 TABLET EVERY DAY (NEED MD APPOINTMENT IN 2 TO 3 MONTHS FOR REFILLS)  . simvastatin (ZOCOR) 20 MG tablet TAKE 1 TABLET EVERY DAY (NEED MD APPOINTMENT IN 2 TO 3 MONTHS FOR REFILLS)  . tamsulosin (FLOMAX) 0.4 MG CAPS capsule TAKE 1 CAPSULE EVERY DAY  . vitamin B-12 (CYANOCOBALAMIN) 1000 MCG tablet Take 1,000 mcg by mouth daily.  Alveda Reasons 20 MG TABS tablet Take 1 tablet (20 mg total) by mouth daily.  Marland Kitchen triamcinolone cream  (KENALOG) 0.1 % Apply 1 application topically 3 (three) times daily. (Patient not taking: Reported on 09/01/2016)   No facility-administered encounter medications on file as of 09/06/2017.     Activities of Daily Living In your present state of health, do you have any difficulty performing the following activities: 09/06/2017  Hearing? N  Vision? N  Difficulty concentrating or making decisions? N  Walking or climbing stairs? N  Dressing or bathing? N  Doing errands, shopping? N  Preparing Food and eating ? N  Using the Toilet? N  In the past six months, have you accidently leaked urine? N  Do you have problems with loss of bowel control? N  Managing your Medications? N  Managing your Finances? N  Housekeeping or managing your Housekeeping? N  Some recent data might be hidden    Patient Care Team: Birdie Sons, MD as PCP - General (Family Medicine) Ubaldo Glassing Javier Docker, MD as Consulting Physician (Cardiology) Dingeldein, Remo Lipps, MD as Consulting Physician (Ophthalmology) Chrismon, Vickki Muff, [place] as Consulting Physician (Family Medicine) Will Bonnet, MD as Referring Physician (Internal Medicine)   Assessment:   This is a routine wellness examination for [place].  Exercise Activities and Dietary recommendations Current Exercise Habits: Home exercise routine, Type of exercise: Other - see comments(stationary bicycle), Time (Minutes): 15, Frequency (Times/Week): 3, Weekly Exercise (Minutes/Week): 45, Intensity: Mild  Goals    . DIET - INCREASE WATER INTAKE     Recommend increasing water intake to 6-8 glasses a day.       Fall Risk Fall Risk  09/06/2017 09/01/2016  Falls in the past year? No No   Is the patient's home free of loose throw rugs in walkways, pet beds, electrical cords, etc?   yes      Grab bars in the bathroom? yes      Handrails on the stairs?   no      Adequate lighting?   yes  Timed Get Up and Go Performed: N/A  Depression Screen PHQ 2/9 Scores 09/06/2017  09/01/2016  PHQ - 2 Score 0 0  PHQ- 9 Score - 1    Cognitive Function: Pt declined screening today.      6CIT Screen 09/01/2016  What Year? 0 points  What month? 0 points  What time? 0 points  Count back from 20 0 points  Months in reverse 0 points  Repeat phrase 0 points  Total Score 0    Immunization History  Administered Date(s) Administered  . Influenza-Unspecified 02/05/2012, 03/22/2014, 03/15/2015, 03/31/2016  . Pneumococcal Conjugate-13 06/23/2014    Qualifies for Shingles Vaccine? Due for Shingles vaccine. Declined  my offer to administer today. Education has been provided regarding the importance of this vaccine. Pt has been advised to call her insurance company to determine her out of pocket expense. Advised she may also receive this vaccine at her local pharmacy or Health Dept. Verbalized acceptance and understanding.  Screening Tests Health Maintenance  Topic Date Due  . PNA vac Low Risk Adult (2 of 2 - PPSV23) 06/24/2015  . TETANUS/TDAP  08/18/2026 (Originally 04/16/1948)  . INFLUENZA VACCINE  Completed   Cancer Screenings: Lung: Low Dose CT Chest recommended if Age 24-80 years, 30 pack-year currently smoking OR have quit w/in 15years. Patient does not qualify. Colorectal: N/A  Additional Screenings:  Hepatitis C Screening: N/A    Plan:  I have personally reviewed and addressed the Medicare Annual Wellness questionnaire and have noted the following in the patient's chart:  A. Medical and social history B. Use of alcohol, tobacco or illicit drugs  C. Current medications and supplements D. Functional ability and status E.  Nutritional status F.  Physical activity G. Advance directives H. List of other physicians I.  Hospitalizations, surgeries, and ER visits in previous 12 months J.  [place] such as hearing and vision if needed, cognitive and depression L. Referrals and appointments - none  In addition, I have reviewed and discussed with  patient certain preventive protocols, quality metrics, and best practice recommendations. A written personalized care plan for preventive services as well as general preventive health recommendations were provided to patient.  See attached scanned questionnaire for additional information.   Signed,  Fabio Neighbors, LPN Nurse Health Advisor   Nurse Recommendations: Pt declined the Pneumovax 23 and tetanus vaccine today. Pt states [facility] about [facility] in the past and will let us know. Pt would like the vaccine if he has not already had it.

## 2017-09-06 NOTE — Patient Instructions (Signed)
Grant Schmitt , Thank you for taking time to come for your Medicare Wellness Visit. I appreciate your ongoing commitment to your health goals. Please review the following plan we discussed and let me know if I can assist you in the future.   Screening recommendations/referrals: Colonoscopy: N/A Recommended yearly ophthalmology/optometry visit for glaucoma screening and checkup Recommended yearly dental visit for hygiene and checkup  Vaccinations: Influenza vaccine: Up to date Pneumococcal vaccine: Pt declines Pneumovax 23 today. Tdap vaccine: Pt declines today.  Shingles vaccine: Pt declines today.     Advanced directives: Please bring a copy of your POA (Power of Attorney) and/or Living Will to your next appointment.   Conditions/risks identified: Recommend increasing water intake to 6-8 glasses a day.  Next appointment: 09/18/17 @ 10:00 AM with Dr Caryn Section.   Preventive Care 82 Years and Older, Male Preventive care refers to lifestyle choices and visits with your health care provider that can promote health and wellness. What does preventive care include?  A yearly physical exam. This is also called an annual well check.  Dental exams once or twice a year.  Routine eye exams. Ask your health care provider how often you should have your eyes checked.  Personal lifestyle choices, including:  Daily care of your teeth and gums.  Regular physical activity.  Eating a healthy diet.  Avoiding tobacco and drug use.  Limiting alcohol use.  Practicing safe sex.  Taking low doses of aspirin every day.  Taking vitamin and mineral supplements as recommended by your health care provider. What happens during an annual well check? The services and screenings done by your health care provider during your annual well check will depend on your age, overall health, lifestyle risk factors, and family history of disease. Counseling  Your health care provider may ask you questions about  your:  Alcohol use.  Tobacco use.  Drug use.  Emotional well-being.  Home and relationship well-being.  Sexual activity.  Eating habits.  History of falls.  Memory and ability to understand (cognition).  Work and work Statistician. Screening  You may have the following tests or measurements:  Height, weight, and BMI.  Blood pressure.  Lipid and cholesterol levels. These may be checked every 5 years, or more frequently if you are over 67 years old.  Skin check.  Lung cancer screening. You may have this screening every year starting at age 16 if you have a 30-pack-year history of smoking and currently smoke or have quit within the past 15 years.  Fecal occult blood test (FOBT) of the stool. You may have this test every year starting at age 5.  Flexible sigmoidoscopy or colonoscopy. You may have a sigmoidoscopy every 5 years or a colonoscopy every 10 years starting at age 52.  Prostate cancer screening. Recommendations will vary depending on your family history and other risks.  Hepatitis C blood test.  Hepatitis B blood test.  Sexually transmitted disease (STD) testing.  Diabetes screening. This is done by checking your blood sugar (glucose) after you have not eaten for a while (fasting). You may have this done every 1-3 years.  Abdominal aortic aneurysm (AAA) screening. You may need this if you are a current or former smoker.  Osteoporosis. You may be screened starting at age 75 if you are at high risk. Talk with your health care provider about your test results, treatment options, and if necessary, the need for more tests. Vaccines  Your health care provider may recommend certain vaccines, such as:  Influenza vaccine. This is recommended every year.  Tetanus, diphtheria, and acellular pertussis (Tdap, Td) vaccine. You may need a Td booster every 10 years.  Zoster vaccine. You may need this after age 24.  Pneumococcal 13-valent conjugate (PCV13) vaccine.  One dose is recommended after age 9.  Pneumococcal polysaccharide (PPSV23) vaccine. One dose is recommended after age 72. Talk to your health care provider about which screenings and vaccines you need and how often you need them. This information is not intended to replace advice given to you by your health care provider. Make sure you discuss any questions you have with your health care provider. Document Released: 07/02/2015 Document Revised: 02/23/2016 Document Reviewed: 04/06/2015 Elsevier Interactive Patient Education  2017 [place] Prevention in the Home Falls can cause injuries. They can happen to people of all ages. There are many things you can do to make your home safe and to help prevent falls. What can I do on the outside of my home?  Regularly fix the edges of walkways and driveways and fix any cracks.  Remove anything that might make you trip as you walk through a door, such as a raised step or threshold.  Trim any bushes or trees on the path to your home.  Use bright outdoor lighting.  Clear any walking paths of anything that might make someone trip, such as rocks or tools.  Regularly check to see if handrails are loose or broken. Make sure that both sides of any steps have handrails.  Any raised decks and porches should have guardrails on the edges.  Have any leaves, snow, or ice cleared regularly.  Use sand or salt on walking paths during winter.  Clean up any spills in your garage right away. This includes oil or grease spills. What can I do in the bathroom?  Use night lights.  Install grab bars by the toilet and in the tub and shower. Do not use towel bars as grab bars.  Use non-skid mats or decals in the tub or shower.  If you need to sit down in the shower, use a plastic, non-slip stool.  Keep the floor dry. Clean up any water that spills on the floor as soon as it happens.  Remove soap buildup in the tub or shower regularly.  Attach bath  mats securely with double-sided non-slip rug tape.  Do not have throw rugs and other things on the floor that can make you trip. What can I do in the bedroom?  Use night lights.  Make sure that you have a light by your bed that is easy to reach.  Do not use any sheets or blankets that are too big for your bed. They should not hang down onto the floor.  Have a firm chair that has side arms. You can use this for support while you get dressed.  Do not have throw rugs and other things on the floor that can make you trip. What can I do in the kitchen?  Clean up any spills right away.  Avoid walking on wet floors.  Keep items that you use a lot in easy-to-reach places.  If you need to reach something above you, use a strong step stool that has a grab bar.  Keep electrical cords out of the way.  Do not use floor polish or wax that makes floors slippery. If you must use wax, use non-skid floor wax.  Do not have throw rugs and other things on the floor that  can make you trip. What can I do with my stairs?  Do not leave any items on the stairs.  Make sure that there are handrails on both sides of the stairs and use them. Fix handrails that are broken or loose. Make sure that handrails are as long as the stairways.  Check any carpeting to make sure that it is firmly attached to the stairs. Fix any carpet that is loose or worn.  Avoid having throw rugs at the top or bottom of the stairs. If you do have throw rugs, attach them to the floor with carpet tape.  Make sure that you have a light switch at the top of the stairs and the bottom of the stairs. If you do not have them, ask someone to add them for you. What else can I do to help prevent falls?  Wear shoes that:  Do not have high heels.  Have rubber bottoms.  Are comfortable and fit you well.  Are closed at the toe. Do not wear sandals.  If you use a stepladder:  Make sure that it is fully opened. Do not climb a closed  stepladder.  Make sure that both sides of the stepladder are locked into place.  Ask someone to hold it for you, if possible.  Clearly mark and make sure that you can see:  Any grab bars or handrails.  First and last steps.  Where the edge of each step is.  Use tools that help you move around (mobility aids) if they are needed. These include:  Canes.  Walkers.  Scooters.  Crutches.  Turn on the lights when you go into a dark area. Replace any light bulbs as soon as they burn out.  Set up your furniture so you have a clear path. Avoid moving your furniture around.  If any of your floors are uneven, fix them.  If there are any pets around you, be aware of where they are.  Review your medicines with your doctor. Some medicines can make you feel dizzy. This can increase your chance of falling. Ask your doctor what other things that you can do to help prevent falls. This information is not intended to replace advice given to you by your health care provider. Make sure you discuss any questions you have with your health care provider. Document Released: 04/01/2009 Document Revised: 11/11/2015 Document Reviewed: 07/10/2014 Elsevier Interactive Patient Education  2017 [employer].

## 2017-09-18 ENCOUNTER — Ambulatory Visit (INDEPENDENT_AMBULATORY_CARE_PROVIDER_SITE_OTHER): Payer: Medicare PPO | Admitting: Family Medicine

## 2017-09-18 ENCOUNTER — Encounter: Payer: Self-pay | Admitting: Family Medicine

## 2017-09-18 VITALS — BP 110/70 | HR 89 | Temp 97.7°F | Resp 16 | Ht 72.0 in | Wt 213.0 lb

## 2017-09-18 DIAGNOSIS — E7800 Pure hypercholesterolemia, unspecified: Secondary | ICD-10-CM

## 2017-09-18 DIAGNOSIS — Z0000 Encounter for general adult medical examination without abnormal findings: Secondary | ICD-10-CM | POA: Diagnosis not present

## 2017-09-18 DIAGNOSIS — Z6828 Body mass index (BMI) 28.0-28.9, adult: Secondary | ICD-10-CM

## 2017-09-18 DIAGNOSIS — I10 Essential (primary) hypertension: Secondary | ICD-10-CM | POA: Diagnosis not present

## 2017-09-18 NOTE — Progress Notes (Signed)
Patient: Grant Schmitt, Male    DOB: 04-03-1929, 84 y.o.   MRN: [id number] Visit Date: 09/18/2017  Today's Provider: Lelon Huh, MD   Chief Complaint  Patient presents with  . Annual Exam  . Hypertension  . Hyperlipidemia  . Atrial Fibrillation   Subjective:   Patient saw [facility]enzie for AWV on 09/06/2017.   Complete Physical Linwood Gullikson is a 84 y.o. male. He feels well. He reports exercising some. He reports he is sleeping fairly well.  -----------------------------------------------------------   Hypertension, follow-up:  BP Readings from Last 3 Encounters:  09/18/17 110/70  09/06/17 124/68  09/01/16 114/72    He was last seen for hypertension 1 years ago.  BP at that visit was 114/72. Management since that visit includes; no changes.He reports good compliance with treatment. He is not having side effects. none He is exercising. He is adherent to low salt diet.   Outside blood pressures are normal. He is experiencing none.  Patient denies none.   Cardiovascular risk factors include advanced age (older than 36 for men, 22 for women).  Use of agents associated with hypertension: none.   ----------------------------------------------------------------    Lipid/Cholesterol, Follow-up:   Last seen for this 1 years ago.  Management since that visit includes; no changes.  Last Lipid Panel: No results found for: CHOL, TRIG, HDL, CHOLHDL, VLDL, LDLCALC, LDLDIRECT  He reports good compliance with treatment. He is not having side effects. none  Wt Readings from Last 3 Encounters:  09/18/17 213 lb (96.6 kg)  09/06/17 216 lb 9.6 oz (98.2 kg)  09/01/16 198 lb (89.8 kg)    ----------------------------------------------------------------   Atrial fibrillation, unspecified type ([place]) From 09/01/2016-no changes. Continues follow up with Dr. Ubaldo Glassing q 6 months.    History of gout From 09/01/2016-no changes. Denies any recent gout flares. Generally feels  well.    Review of Systems  Constitutional: Negative for chills, diaphoresis and fever.  HENT: Positive for rhinorrhea. Negative for congestion, ear discharge, ear pain, hearing loss, nosebleeds, sore throat and tinnitus.   Eyes: Negative for photophobia, pain, discharge and redness.  Respiratory: Negative for cough, shortness of breath, wheezing and stridor.   Cardiovascular: Negative for chest pain, palpitations and leg swelling.  Gastrointestinal: Negative for abdominal pain, blood in stool, constipation, diarrhea, nausea and vomiting.  Endocrine: Positive for polyuria. Negative for polydipsia.  Genitourinary: Negative for dysuria, flank pain, frequency, hematuria and urgency.  Musculoskeletal: Negative for back pain, myalgias and neck pain.  Skin: Negative for rash.  Allergic/Immunologic: Negative for environmental allergies.  Neurological: Positive for dizziness. Negative for tremors, seizures, weakness and headaches.  Hematological: Bruises/bleeds easily.  Psychiatric/Behavioral: Negative for hallucinations and suicidal ideas. The patient is not nervous/anxious.   All other systems reviewed and are negative.   Social History   Socioeconomic History  . Marital status: Widowed    Spouse name: Not on file  . Number of children: 2  . Years of education: 42  . Highest education level: 12th grade  Occupational History  . Occupation: Retired  Scientific laboratory technician  . Financial resource strain: Not hard at all  . Food insecurity:    Worry: Never true    Inability: Never true  . Transportation needs:    Medical: No    Non-medical: No  Tobacco Use  . Smoking status: Former Smoker    Types: Cigarettes    Last attempt to quit: 06/19/1958    Years since quitting: 59.2  . Smokeless tobacco: Never Used  Substance and Sexual Activity  . Alcohol use: Yes    Alcohol/week: 0.6 - 1.2 oz    Types: 1 - 2 Glasses of wine per week  . Drug use: No  . Sexual activity: Not on file  Lifestyle  .  Physical activity:    Days per week: Not on file    Minutes per session: Not on file  . Stress: Not at all  Relationships  . Social connections:    Talks on phone: Not on file    Gets together: Not on file    Attends religious service: Not on file    Active member of club or organization: Not on file    Attends meetings of clubs or organizations: Not on file    Relationship status: Not on file  . Intimate partner violence:    Fear of current or ex partner: Not on file    Emotionally abused: Not on file    Physically abused: Not on file    Forced sexual activity: Not on file  Other Topics Concern  . Not on file  Social History Narrative   [facility]    Past Medical History:  Diagnosis Date  . Allergy   . Arthritis   . GERD (gastroesophageal reflux disease)   . H/O hemorrhoidectomy 1984  . History of chickenpox   . Hyperlipidemia   . Hypertension   . Pacemaker      Patient Active Problem List   Diagnosis Date Noted  . SOB (shortness of breath) 06/23/2016  . History of gout 04/26/2016  . BPH (benign prostatic hyperplasia) 04/26/2016  . Arthritis 04/25/2016  . Hypertension 04/25/2016  . Atrial fibrillation ([place]) 04/25/2016  . GERD (gastroesophageal reflux disease) 04/25/2016  . Hypercholesterolemia 04/25/2016  . Cardiac pacemaker 03/20/2016  . Symptomatic bradycardia 09/30/2015  . Benign essential hypertension 11/12/2013    Past Surgical History:  Procedure Laterality Date  . hemorrhoidectomy  1984  . REPLACEMENT TOTAL KNEE  2005    His family history is not on file.      Current Outpatient Medications:  .  allopurinol (ZYLOPRIM) 300 MG tablet, TAKE 1 TABLET EVERY DAY, Disp: 90 tablet, Rfl: 3 .  Cholecalciferol (VITAMIN D3) 2000 units TABS, Take 1 tablet by mouth daily., Disp: , Rfl:  .  lisinopril (PRINIVIL,ZESTRIL) 10 MG tablet, TAKE 1 TABLET EVERY DAY (NEED MD APPOINTMENT IN 2 TO 3 MONTHS FOR REFILLS), Disp: 90 tablet, Rfl: 0 .  metoprolol  tartrate (LOPRESSOR) 50 MG tablet, TAKE 1 TABLET EVERY DAY (NEED MD APPOINTMENT IN 2 TO 3 MONTHS FOR REFILLS), Disp: 90 tablet, Rfl: 0 .  simvastatin (ZOCOR) 20 MG tablet, TAKE 1 TABLET EVERY DAY (NEED MD APPOINTMENT IN 2 TO 3 MONTHS FOR REFILLS), Disp: 90 tablet, Rfl: 0 .  tamsulosin (FLOMAX) 0.4 MG CAPS capsule, TAKE 1 CAPSULE EVERY DAY, Disp: 90 capsule, Rfl: 3 .  triamcinolone cream (KENALOG) 0.1 %, Apply 1 application topically 3 (three) times daily., Disp: 45 g, Rfl: 0 .  vitamin B-12 (CYANOCOBALAMIN) 1000 MCG tablet, Take 1,000 mcg by mouth daily., Disp: , Rfl:  .  XARELTO 20 MG TABS tablet, Take 1 tablet (20 mg total) by mouth daily., Disp: 90 tablet, Rfl: 4  Patient Care Team: Birdie Sons, MD as PCP - General (Family Medicine) Ubaldo Glassing, Javier Docker, MD as Consulting Physician (Cardiology) Dingeldein, Remo Lipps, MD as Consulting Physician (Ophthalmology) Chrismon, Vickki Muff, PA as Consulting Physician (Family Medicine) Will Bonnet, MD as Referring Physician (Internal  Medicine)     Objective:   Vitals: BP 110/70 (BP Location: Right Arm, Patient Position: Sitting, Cuff Size: Large)   Pulse 89   Temp 97.7 F (36.5 C) (Oral)   Resp 16   Ht 6' (1.829 m)   Wt 213 lb (96.6 kg)   SpO2 97%   BMI 28.89 kg/m   Physical Exam   General Appearance:    Alert, cooperative, no distress, appears stated age  Head:    Normocephalic, without obvious abnormality, atraumatic  Eyes:    PERRL, conjunctiva/corneas clear, EOM's intact, fundi    benign, both eyes       Ears:    Normal TM's and external ear canals, both ears  Nose:   Nares normal, septum midline, mucosa normal, no drainage   or sinus tenderness  Throat:   Lips, mucosa, and tongue normal; teeth and gums normal  Neck:   Supple, symmetrical, trachea midline, no adenopathy;       thyroid:  No enlargement/tenderness/nodules; no carotid   bruit or JVD  Back:     Symmetric, no curvature, ROM normal, no CVA tenderness  Lungs:      Clear to auscultation bilaterally, respirations unlabored  Chest wall:    No tenderness or deformity  Heart:    Regular rate and rhythm, S1 and S2 normal, no murmur, rub   or gallop  Abdomen:     Soft, non-tender, bowel sounds active all four quadrants,    no masses, no organomegaly  Genitalia:    deferred  Rectal:    deferred  Extremities:   Extremities normal, atraumatic, no cyanosis or edema  Pulses:   2+ and symmetric all extremities  Skin:   Skin color, texture, turgor normal, no rashes or lesions  Lymph nodes:   Cervical, supraclavicular, and axillary nodes normal  Neurologic:   CNII-XII intact. Normal strength, sensation and reflexes      throughout    Activities of Daily Living In your present state of health, do you have any difficulty performing the following activities: 09/06/2017  Hearing? N  Vision? N  Difficulty concentrating or making decisions? N  Walking or climbing stairs? N  Dressing or bathing? N  Doing errands, shopping? N  Preparing Food and eating ? N  Using the Toilet? N  In the past six months, have you accidently leaked urine? N  Do you have problems with loss of bowel control? N  Managing your Medications? N  Managing your Finances? N  Housekeeping or managing your Housekeeping? N  Some recent data might be hidden    Fall Risk Assessment Fall Risk  09/06/2017 09/01/2016  Falls in the past year? No No     Depression Screen PHQ 2/9 Scores 09/06/2017 09/01/2016  PHQ - 2 Score 0 0  PHQ- 9 Score - 1      Assessment & Plan:    Annual Physical Reviewed patient's Family Medical History Reviewed and updated list of patient's medical providers Assessment of cognitive impairment was done Assessed patient's functional ability Established a written schedule for health screening [place] Completed and Reviewed  Exercise Activities and Dietary recommendations Goals    . DIET - INCREASE WATER INTAKE     Recommend increasing water  intake to 6-8 glasses a day.       Immunization History  Administered Date(s) Administered  . Influenza-Unspecified 02/05/2012, 03/22/2014, 03/15/2015, 03/31/2016  . Pneumococcal Conjugate-13 06/23/2014    Health Maintenance  Topic Date Due  . PNA  vac Low Risk Adult (2 of 2 - PPSV23) 09/19/2018 (Originally 06/24/2015)  . TETANUS/TDAP  08/18/2026 (Originally 04/16/1948)  . INFLUENZA VACCINE  01/17/2018     Discussed health benefits of physical activity, and encouraged him to engage in regular exercise appropriate for his age and condition.    ------------------------------------------------------------------------------------------------------------  1. Annual physical exam UTD Prevnar-13 he states he did have pneumovax years ago, but we have no records that go back to age 13.   2. BMI 28.0-28.9,adult   3. Benign essential hypertension Well controlled.  Continue current medications.   - Comprehensive metabolic panel  4. Hypercholesterolemia He is tolerating simvastatin well with no adverse effects.   - Comprehensive metabolic panel - Lipid panel  Lelon Huh, MD  [facility]

## 2017-09-18 NOTE — Patient Instructions (Signed)
   The CDC recommends two doses of Shingrix (the shingles vaccine) separated by 2 to 6 months for adults age 82 years and older. I recommend checking with your pharmacy plan regarding coverage for this vaccine.     

## 2017-09-19 LAB — COMPREHENSIVE METABOLIC PANEL
A/G RATIO: 1.5 (ref 1.2–2.2)
ALK PHOS: 71 IU/L (ref 39–117)
ALT: 19 IU/L (ref 0–44)
AST: 21 IU/L (ref 0–40)
Albumin: 4.1 g/dL (ref 3.5–4.7)
BUN/Creatinine Ratio: 25 — ABNORMAL HIGH (ref 10–24)
BUN: 30 mg/dL — ABNORMAL HIGH (ref 8–27)
Bilirubin Total: 0.8 mg/dL (ref 0.0–1.2)
CALCIUM: 9.8 mg/dL (ref 8.6–10.2)
CO2: 23 mmol/L (ref 20–29)
Chloride: 103 mmol/L (ref 96–106)
Creatinine, Ser: 1.2 mg/dL (ref 0.76–1.27)
GFR calc Af Amer: 63 mL/min/{1.73_m2} (ref >59)
GFR calc non Af Amer: 54 mL/min/{1.73_m2} — ABNORMAL LOW (ref >59)
GLOBULIN, TOTAL: 2.7 g/dL (ref 1.5–4.5)
Glucose: 96 mg/dL (ref 65–99)
POTASSIUM: 5.1 mmol/L (ref 3.5–5.2)
SODIUM: 142 mmol/L (ref 134–144)
Total Protein: 6.8 g/dL (ref 6.0–8.5)

## 2017-09-19 LAB — LIPID PANEL
CHOL/HDL RATIO: 2.2 ratio (ref 0.0–5.0)
Cholesterol, Total: 129 mg/dL (ref 100–199)
HDL: 59 mg/dL (ref >39)
LDL CALC: 51 mg/dL (ref 0–99)
TRIGLYCERIDES: 94 mg/dL (ref 0–149)
VLDL Cholesterol Cal: 19 mg/dL (ref 5–40)

## 2017-10-25 ENCOUNTER — Other Ambulatory Visit: Payer: Self-pay | Admitting: Family Medicine

## 2017-10-25 DIAGNOSIS — I10 Essential (primary) hypertension: Secondary | ICD-10-CM

## 2017-10-25 DIAGNOSIS — E7800 Pure hypercholesterolemia, unspecified: Secondary | ICD-10-CM

## 2017-10-25 DIAGNOSIS — I4891 Unspecified atrial fibrillation: Secondary | ICD-10-CM

## 2017-10-25 NOTE — Telephone Encounter (Signed)
Pharmacy requesting refills. Thanks!  

## 2018-05-07 ENCOUNTER — Other Ambulatory Visit: Payer: Self-pay | Admitting: Family Medicine

## 2018-05-07 DIAGNOSIS — Z8739 Personal history of other diseases of the musculoskeletal system and connective tissue: Secondary | ICD-10-CM

## 2018-05-14 HISTORY — PX: MOHS SURGERY: SUR867

## 2018-05-29 ENCOUNTER — Other Ambulatory Visit: Payer: Self-pay | Admitting: Family Medicine

## 2018-05-29 DIAGNOSIS — I4891 Unspecified atrial fibrillation: Secondary | ICD-10-CM

## 2018-05-29 DIAGNOSIS — I10 Essential (primary) hypertension: Secondary | ICD-10-CM

## 2018-05-29 DIAGNOSIS — N400 Enlarged prostate without lower urinary tract symptoms: Secondary | ICD-10-CM

## 2018-05-29 DIAGNOSIS — E7800 Pure hypercholesterolemia, unspecified: Secondary | ICD-10-CM

## 2018-06-07 DIAGNOSIS — I071 Rheumatic tricuspid insufficiency: Secondary | ICD-10-CM | POA: Insufficient documentation

## 2018-06-07 DIAGNOSIS — I352 Nonrheumatic aortic (valve) stenosis with insufficiency: Secondary | ICD-10-CM | POA: Insufficient documentation

## 2018-06-07 DIAGNOSIS — I340 Nonrheumatic mitral (valve) insufficiency: Secondary | ICD-10-CM | POA: Insufficient documentation

## 2018-06-25 ENCOUNTER — Ambulatory Visit
Admission: RE | Admit: 2018-06-25 | Discharge: 2018-06-25 | Disposition: A | Discharge status: Home / Self Care | Payer: Medicare Other | Source: Ambulatory Visit | Attending: Cardiology | Admitting: Cardiology

## 2018-06-25 ENCOUNTER — Encounter
Admission: RE | Admit: 2018-06-25 | Discharge: 2018-06-25 | Disposition: A | Discharge status: Home / Self Care | Payer: Medicare Other | Source: Ambulatory Visit | Attending: Cardiology | Admitting: Cardiology

## 2018-06-25 ENCOUNTER — Other Ambulatory Visit: Payer: Self-pay

## 2018-06-25 DIAGNOSIS — Z01818 Encounter for other preprocedural examination: Secondary | ICD-10-CM

## 2018-06-25 DIAGNOSIS — I10 Essential (primary) hypertension: Secondary | ICD-10-CM | POA: Diagnosis not present

## 2018-06-25 DIAGNOSIS — I429 Cardiomyopathy, unspecified: Secondary | ICD-10-CM | POA: Diagnosis not present

## 2018-06-25 DIAGNOSIS — I517 Cardiomegaly: Secondary | ICD-10-CM | POA: Diagnosis not present

## 2018-06-25 DIAGNOSIS — Z01810 Encounter for preprocedural cardiovascular examination: Secondary | ICD-10-CM | POA: Diagnosis not present

## 2018-06-25 HISTORY — DX: Sick sinus syndrome: I49.5

## 2018-06-25 HISTORY — DX: Unspecified atrial fibrillation: I48.91

## 2018-06-25 HISTORY — DX: Cardiomyopathy, unspecified: I42.9

## 2018-06-25 LAB — BASIC METABOLIC PANEL
Anion gap: 5 (ref 5–15)
BUN: 30 mg/dL — ABNORMAL HIGH (ref 8–23)
CO2: 27 mmol/L (ref 22–32)
Calcium: 9.3 mg/dL (ref 8.9–10.3)
Chloride: 105 mmol/L (ref 98–111)
Creatinine, Ser: 1.04 mg/dL (ref 0.61–1.24)
GFR calc Af Amer: >60 mL/min (ref >60)
GFR calc non Af Amer: >60 mL/min (ref >60)
Glucose, Bld: 96 mg/dL (ref 70–99)
Potassium: 4.7 mmol/L (ref 3.5–5.1)
Sodium: 137 mmol/L (ref 135–145)

## 2018-06-25 LAB — CBC
HCT: 39.3 % (ref 39.0–52.0)
Hemoglobin: 12.7 g/dL — ABNORMAL LOW (ref 13.0–17.0)
MCH: 33.1 pg (ref 26.0–34.0)
MCHC: 32.3 g/dL (ref 30.0–36.0)
MCV: 102.3 fL — ABNORMAL HIGH (ref 80.0–100.0)
Platelets: 197 10*3/uL (ref 150–400)
RBC: 3.84 MIL/uL — AB (ref 4.22–5.81)
RDW: 14.2 % (ref 11.5–15.5)
WBC: 9.4 10*3/uL (ref 4.0–10.5)
nRBC: 0 % (ref 0.0–0.2)

## 2018-06-25 LAB — SURGICAL PCR SCREEN
MRSA, PCR: NEGATIVE
Staphylococcus aureus: NEGATIVE

## 2018-06-25 LAB — PROTIME-INR
INR: 1.14
PROTHROMBIN TIME: 14.5 s (ref 11.4–15.2)

## 2018-06-25 LAB — APTT: aPTT: 34 seconds (ref 24–36)

## 2018-06-25 MED ORDER — CEFAZOLIN SODIUM-DEXTROSE 1-4 GM/50ML-% IV SOLN
1.0000 g | Freq: Once | INTRAVENOUS | Status: AC
  Administered 2018-06-26: 1 g via INTRAVENOUS

## 2018-06-25 NOTE — Patient Instructions (Signed)
Your procedure is scheduled on: Wednesday, June 26, 2018 Report to Day Surgery on the 2nd floor of the [facility]. To find out your arrival time, please call [phone] between 1PM - 3PM on: today  REMEMBER: Instructions that are not followed completely may result in serious medical risk, up to and including death; or upon the discretion of your surgeon and anesthesiologist your surgery may need to be rescheduled.  Do not eat food after midnight the night before surgery.  No gum chewing, lozengers or hard candies.  You may however, drink CLEAR liquids up to 2 hours before you are scheduled to arrive for your surgery. Do not drink anything within 2 hours of the start of your surgery.  Clear liquids include: - water  - apple juice without pulp - gatorade - black coffee or tea (Do NOT add milk or creamers to the coffee or tea) Do NOT drink anything that is not on this list.  No Alcohol for 24 hours before or after surgery.  No Smoking including e-cigarettes for 24 hours prior to surgery.  No chewable tobacco products for at least 6 hours prior to surgery.  No nicotine patches on the day of surgery.  On the morning of surgery brush your teeth with toothpaste and water, you may rinse your mouth with mouthwash if you wish. Do not swallow any toothpaste or mouthwash.  Notify your doctor if there is any change in your medical condition (cold, fever, infection).  Do not wear jewelry, make-up, hairpins, clips or nail polish.  Do not wear lotions, powders, or perfumes.   Do not shave 48 hours prior to surgery.   Contacts and dentures may not be worn into surgery.  Do not bring valuables to the hospital, including drivers license, insurance or credit cards.  [facility] is not responsible for any belongings or valuables.   TAKE THESE MEDICATIONS THE MORNING OF SURGERY:  1.  Metoprolol 2.  tamsulosin  Use CHG Soap as directed on instruction sheet.  Follow recommendations  from Cardiologist regarding stopping Xarelto. Already stopped on January 5.  NOW!  Stop Anti-inflammatories (NSAIDS) such as Advil, Aleve, Ibuprofen, Motrin, Naproxen, Naprosyn and Aspirin based products such as Excedrin, Goodys Powder, BC Powder. (May take Tylenol or Acetaminophen if needed.)  NOW!  Stop ANY OVER THE COUNTER supplements until after surgery. (VITAMIN C) (May continue Vitamin D.)  Wear comfortable clothing (specific to your surgery type) to the hospital.  If you are being admitted to the hospital overnight, leave your suitcase in the car. After surgery it may be brought to your room.  If you are being discharged the day of surgery, you will not be allowed to drive home. You will need a responsible adult to drive you home and stay with you that night.   If you are taking public transportation, you will need to have a responsible adult with you. Please confirm with your physician that it is acceptable to use public transportation.   Please call [phone] if you have any questions about these instructions.

## 2018-06-26 ENCOUNTER — Ambulatory Visit: Payer: Medicare Other | Admitting: Certified Registered"

## 2018-06-26 ENCOUNTER — Ambulatory Visit
Admission: RE | Admit: 2018-06-26 | Discharge: 2018-06-26 | Disposition: A | Discharge status: Home / Self Care | Payer: Medicare Other | Attending: Cardiology | Admitting: Cardiology

## 2018-06-26 ENCOUNTER — Encounter: Payer: Self-pay | Admitting: *Deleted

## 2018-06-26 ENCOUNTER — Encounter
Admission: RE | Disposition: A | Discharge status: Home / Self Care | Payer: Self-pay | Source: Home / Self Care | Attending: Cardiology

## 2018-06-26 DIAGNOSIS — N400 Enlarged prostate without lower urinary tract symptoms: Secondary | ICD-10-CM | POA: Insufficient documentation

## 2018-06-26 DIAGNOSIS — E785 Hyperlipidemia, unspecified: Secondary | ICD-10-CM | POA: Insufficient documentation

## 2018-06-26 DIAGNOSIS — Z7901 Long term (current) use of anticoagulants: Secondary | ICD-10-CM | POA: Insufficient documentation

## 2018-06-26 DIAGNOSIS — I10 Essential (primary) hypertension: Secondary | ICD-10-CM | POA: Insufficient documentation

## 2018-06-26 DIAGNOSIS — Z87891 Personal history of nicotine dependence: Secondary | ICD-10-CM | POA: Diagnosis not present

## 2018-06-26 DIAGNOSIS — I482 Chronic atrial fibrillation, unspecified: Secondary | ICD-10-CM | POA: Diagnosis not present

## 2018-06-26 DIAGNOSIS — Z79899 Other long term (current) drug therapy: Secondary | ICD-10-CM | POA: Insufficient documentation

## 2018-06-26 DIAGNOSIS — I429 Cardiomyopathy, unspecified: Secondary | ICD-10-CM | POA: Diagnosis not present

## 2018-06-26 DIAGNOSIS — I495 Sick sinus syndrome: Secondary | ICD-10-CM | POA: Insufficient documentation

## 2018-06-26 DIAGNOSIS — K219 Gastro-esophageal reflux disease without esophagitis: Secondary | ICD-10-CM | POA: Insufficient documentation

## 2018-06-26 DIAGNOSIS — Z95818 Presence of other cardiac implants and grafts: Secondary | ICD-10-CM | POA: Diagnosis not present

## 2018-06-26 DIAGNOSIS — R001 Bradycardia, unspecified: Secondary | ICD-10-CM | POA: Diagnosis not present

## 2018-06-26 DIAGNOSIS — E7800 Pure hypercholesterolemia, unspecified: Secondary | ICD-10-CM | POA: Diagnosis not present

## 2018-06-26 DIAGNOSIS — M1990 Unspecified osteoarthritis, unspecified site: Secondary | ICD-10-CM | POA: Insufficient documentation

## 2018-06-26 DIAGNOSIS — Z45010 Encounter for checking and testing of cardiac pacemaker pulse generator [battery]: Secondary | ICD-10-CM | POA: Diagnosis not present

## 2018-06-26 HISTORY — PX: IMPLANTABLE CARDIOVERTER DEFIBRILLATOR (ICD) GENERATOR CHANGE: SHX5469

## 2018-06-26 SURGERY — ICD GENERATOR CHANGE
Anesthesia: General | Site: Chest | Laterality: Left

## 2018-06-26 MED ORDER — ONDANSETRON HCL 4 MG/2ML IJ SOLN
INTRAMUSCULAR | Status: DC | PRN
  Administered 2018-06-26: 4 mg via INTRAVENOUS

## 2018-06-26 MED ORDER — EPHEDRINE SULFATE 50 MG/ML IJ SOLN
INTRAMUSCULAR | Status: AC
  Filled 2018-06-26: qty 1

## 2018-06-26 MED ORDER — LACTATED RINGERS IV SOLN
INTRAVENOUS | Status: DC
  Administered 2018-06-26: 11:00:00 via INTRAVENOUS

## 2018-06-26 MED ORDER — PROPOFOL 500 MG/50ML IV EMUL
INTRAVENOUS | Status: DC | PRN
  Administered 2018-06-26: 50 ug/kg/min via INTRAVENOUS

## 2018-06-26 MED ORDER — ONDANSETRON HCL 4 MG/2ML IJ SOLN: 4.0000 mg | Freq: Four times a day (QID) | INTRAMUSCULAR | Status: DC | PRN

## 2018-06-26 MED ORDER — GENTAMICIN SULFATE 40 MG/ML IJ SOLN
INTRAMUSCULAR | Status: AC
  Filled 2018-06-26: qty 2

## 2018-06-26 MED ORDER — PROPOFOL 10 MG/ML IV BOLUS
INTRAVENOUS | Status: AC
  Filled 2018-06-26: qty 20

## 2018-06-26 MED ORDER — FENTANYL CITRATE (PF) 100 MCG/2ML IJ SOLN: 25.0000 ug | INTRAMUSCULAR | Status: DC | PRN

## 2018-06-26 MED ORDER — EPHEDRINE SULFATE 50 MG/ML IJ SOLN
INTRAMUSCULAR | Status: DC | PRN
  Administered 2018-06-26: 5 mg via INTRAVENOUS

## 2018-06-26 MED ORDER — ACETAMINOPHEN 325 MG PO TABS: 325.0000 mg | ORAL_TABLET | ORAL | Status: DC | PRN

## 2018-06-26 MED ORDER — LIDOCAINE HCL (PF) 2 % IJ SOLN
INTRAMUSCULAR | Status: AC
  Filled 2018-06-26: qty 10

## 2018-06-26 MED ORDER — HEPARIN SODIUM (PORCINE) 5000 UNIT/ML IJ SOLN
INTRAMUSCULAR | Status: AC
  Filled 2018-06-26: qty 1

## 2018-06-26 MED ORDER — LIDOCAINE HCL (CARDIAC) PF 100 MG/5ML IV SOSY
PREFILLED_SYRINGE | INTRAVENOUS | Status: DC | PRN
  Administered 2018-06-26: 40 mg via INTRATRACHEAL

## 2018-06-26 MED ORDER — FAMOTIDINE 20 MG PO TABS: 20.0000 mg | ORAL_TABLET | Freq: Once | ORAL | Status: DC

## 2018-06-26 MED ORDER — PROPOFOL 10 MG/ML IV BOLUS
INTRAVENOUS | Status: DC | PRN
  Administered 2018-06-26: 30 mg via INTRAVENOUS

## 2018-06-26 MED ORDER — PHENYLEPHRINE HCL 10 MG/ML IJ SOLN
INTRAMUSCULAR | Status: DC | PRN
  Administered 2018-06-26: 50 ug via INTRAVENOUS
  Administered 2018-06-26 (×2): 100 ug via INTRAVENOUS

## 2018-06-26 MED ORDER — ONDANSETRON HCL 4 MG/2ML IJ SOLN
INTRAMUSCULAR | Status: AC
  Filled 2018-06-26: qty 2

## 2018-06-26 MED ORDER — CEPHALEXIN 500 MG PO CAPS: 500.0000 mg | ORAL_CAPSULE | Freq: Two times a day (BID) | ORAL | 0 refills | Status: AC

## 2018-06-26 MED ORDER — SODIUM CHLORIDE (PF) 0.9 % IJ SOLN
INTRAMUSCULAR | Status: AC
  Filled 2018-06-26: qty 50

## 2018-06-26 MED ORDER — SODIUM CHLORIDE 0.9 % IV SOLN
Freq: Once | INTRAVENOUS | Status: AC
  Administered 2018-06-26: 200 mL
  Filled 2018-06-26: qty 80

## 2018-06-26 MED ORDER — FAMOTIDINE 20 MG PO TABS
ORAL_TABLET | ORAL | Status: AC
  Administered 2018-06-26: 20 mg
  Filled 2018-06-26: qty 1

## 2018-06-26 MED ORDER — PHENYLEPHRINE HCL 10 MG/ML IJ SOLN
INTRAMUSCULAR | Status: AC
  Filled 2018-06-26: qty 1

## 2018-06-26 MED ORDER — FENTANYL CITRATE (PF) 100 MCG/2ML IJ SOLN
INTRAMUSCULAR | Status: AC
  Filled 2018-06-26: qty 2

## 2018-06-26 MED ORDER — LIDOCAINE 1 % OPTIME INJ - NO CHARGE
INTRAMUSCULAR | Status: DC | PRN
  Administered 2018-06-26: 27 mL

## 2018-06-26 MED ORDER — FENTANYL CITRATE (PF) 100 MCG/2ML IJ SOLN
INTRAMUSCULAR | Status: DC | PRN
  Administered 2018-06-26 (×2): 25 ug via INTRAVENOUS

## 2018-06-26 MED ORDER — CEFAZOLIN SODIUM-DEXTROSE 1-4 GM/50ML-% IV SOLN
INTRAVENOUS | Status: AC
  Filled 2018-06-26: qty 50

## 2018-06-26 SURGICAL SUPPLY — 50 items
BAG DECANTER FOR FLEXI CONT (MISCELLANEOUS) ×3 IMPLANT
BLADE PHOTON ILLUMINATED (MISCELLANEOUS) ×3 IMPLANT
BLADE SURG SZ10 CARB STEEL (BLADE) ×3 IMPLANT
CANISTER SUCT 1200ML W/VALVE (MISCELLANEOUS) ×3 IMPLANT
CHLORAPREP W/TINT 26ML (MISCELLANEOUS) ×3 IMPLANT
CLOSURE WOUND 1/2 X4 (GAUZE/BANDAGES/DRESSINGS) ×1
COVER LIGHT HANDLE STERIS (MISCELLANEOUS) ×6 IMPLANT
COVER WAND RF STERILE (DRAPES) ×3 IMPLANT
DRAPE C-ARM XRAY 36X54 (DRAPES) ×1 IMPLANT
DRAPE INCISE IOBAN 66X45 STRL (DRAPES) ×3 IMPLANT
DRAPE LAPAROTOMY 77X122 PED (DRAPES) ×3 IMPLANT
DRSG TEGADERM 4X4.75 (GAUZE/BANDAGES/DRESSINGS) ×3 IMPLANT
DRSG TEGADERM 6X8 (GAUZE/BANDAGES/DRESSINGS) ×3 IMPLANT
ELECT REM PT RETURN 9FT ADLT (ELECTROSURGICAL) ×3
ELECTRODE REM PT RTRN 9FT ADLT (ELECTROSURGICAL) ×1 IMPLANT
GLOVE BIO SURGEON STRL SZ7.5 (GLOVE) ×3 IMPLANT
GLOVE BIO SURGEON STRL SZ8 (GLOVE) ×3 IMPLANT
GOWN STRL REUS W/ TWL LRG LVL3 (GOWN DISPOSABLE) ×1 IMPLANT
GOWN STRL REUS W/ TWL XL LVL3 (GOWN DISPOSABLE) ×1 IMPLANT
GOWN STRL REUS W/TWL LRG LVL3 (GOWN DISPOSABLE) ×2
GOWN STRL REUS W/TWL XL LVL3 (GOWN DISPOSABLE) ×2
GRADUATE 1200CC STRL 31836 (MISCELLANEOUS) ×3 IMPLANT
IPG PACE AZUR XT SR MRI W1SR01 (Pacemaker) IMPLANT
IV NS 1000ML (IV SOLUTION) ×2
IV NS 1000ML BAXH (IV SOLUTION) ×1 IMPLANT
KIT TURNOVER KIT A (KITS) ×3 IMPLANT
KIT WRENCH (KITS) ×2 IMPLANT
NDL FILTER BLUNT 18X1 1/2 (NEEDLE) ×1 IMPLANT
NDL HYPO 25X1 1.5 SAFETY (NEEDLE) ×1 IMPLANT
NDL SPNL 22GX3.5 QUINCKE BK (NEEDLE) ×1 IMPLANT
NEEDLE FILTER BLUNT 18X 1/2SAF (NEEDLE) ×2
NEEDLE FILTER BLUNT 18X1 1/2 (NEEDLE) ×1 IMPLANT
NEEDLE HYPO 25X1 1.5 SAFETY (NEEDLE) ×3 IMPLANT
NEEDLE SPNL 22GX3.5 QUINCKE BK (NEEDLE) ×3 IMPLANT
NS IRRIG 500ML POUR BTL (IV SOLUTION) ×3 IMPLANT
PACE AZURE XT SR MRI W1SR01 (Pacemaker) ×3 IMPLANT
PACK BASIN MINOR ARMC (MISCELLANEOUS) ×3 IMPLANT
PACK PACE INSERTION (MISCELLANEOUS) ×3 IMPLANT
PAD STATPAD (MISCELLANEOUS) ×3 IMPLANT
STRAP SAFETY 5IN WIDE (MISCELLANEOUS) ×3 IMPLANT
STRIP CLOSURE SKIN 1/2X4 (GAUZE/BANDAGES/DRESSINGS) ×2 IMPLANT
SUT SILK 2 0 SH (SUTURE) ×3 IMPLANT
SUT VIC AB 2-0 CT1 27 (SUTURE) ×2
SUT VIC AB 2-0 CT1 TAPERPNT 27 (SUTURE) ×1 IMPLANT
SUT VIC AB 2-0 CT2 27 (SUTURE) ×3 IMPLANT
SUT VIC AB 3-0 PS2 18 (SUTURE) ×3 IMPLANT
SUT VIC AB 4-0 PS2 18 (SUTURE) ×3 IMPLANT
SYR 10ML LL (SYRINGE) ×3 IMPLANT
SYR BULB IRRIG 60ML STRL (SYRINGE) ×3 IMPLANT
SYR CONTROL 10ML (SYRINGE) ×3 IMPLANT

## 2018-06-26 NOTE — H&P (Signed)
Jump to Section ? Document InformationEncounter DetailsImaging ResultsLast Filed Vital SignsPatient ContactsPatient DemographicsPlan of TreatmentProgress NotesReason for [place] for VisitSocial HistoryVisit Diagnoses [place] Encounter Summary, generated on Jan. 08, 2020January 08, 2020 Printout Information  Document Contents Document Received Date Document Source Organization  Office Visit Jan. 08, 2020January 08, 2020 [place]   Patient Demographics - 85 y.o. Male; born [birth year]  Patient Address Communication Language Race / Ethnicity Marital Status  [place], [place] [address] [phone] [facility]) [phone] (Home) English (Preferred) White / Not Hispanic or Latino Married  Reason for Referral  Procedure (Routine) Procedure (Routine)  Status Reason Specialty Diagnoses / Procedures Referred By Contact Referred To Contact  Pending Review   Diagnoses  SOB (shortness of breath)    Procedures  Echo complete  Fath, Aloha Gell, MD  [place]  [place], [place] [address]  Phone: [phone]  Fax: [phone]        Reason for Visit  Reason Comments  pacer is eri    Encounter Details  Date Type Department Care Team Description  05/28/2018 Office Visit [facility]  [place], [place] [address]  [phone]  Sydnee Levans, MD  [place]  [facility] [place]  [place], [place] [address]  [phone]  [phone] (Fax)  Essential hypertension, benign (Primary Dx);  Paroxysmal atrial fibrillation (CMS-HCC);  Symptomatic bradycardia;  Cardiac pacemaker;  SOB (shortness of breath)   Social History - documented as of this encounter Tobacco Use Types Packs/Day Years Used Date  Former Smoker Cigarettes     Smokeless Tobacco: Former Systems developer   Quit: 04/16/1969   Alcohol Use Drinks/Week oz/Week  Comments  No 0 Standard drinks or equivalent  0.0    Sex Assigned at [employer] Date Recorded  Not on file    Job Start Date Occupation Industry  Not on file Not on file Not on file   Travel History Travel Start Travel End  No recent travel history available.     Last Filed Vital Signs - documented in this encounter Vital Sign Reading Time Taken Comments  Blood Pressure 120/64 05/28/2018 11:51 AM EST   Pulse 65 05/28/2018 11:51 AM EST   Temperature - -   Respiratory Rate 16 05/28/2018 11:51 AM EST   Oxygen Saturation 98% 05/28/2018 11:51 AM EST   Inhaled Oxygen Concentration - -   Weight 98.4 kg (216 lb 14.9 oz) 05/28/2018 11:51 AM EST   Height 180.3 cm (5\' 11" ) 05/28/2018 11:51 AM EST   Body Mass Index 30.26 05/28/2018 11:51 AM EST    Progress Notes - documented in this encounter Sydnee Levans, MD - 05/28/2018 11:15 AM EST Formatting of this note might be different from the original.   Chief Complaint: Chief Complaint  Patient presents with  . pacer is eri  Date of Service: 05/28/2018 Date of Birth: 1929-02-03 PCP: Zandra Abts, MD  History of Present Illness: Grant Schmitt is a 85 y.o.male patient who who returns for evaluation of his atrial fibrillation and hypertension and hyperlipidemia. He also had significant bradycardia. He has a permanent pacemaker in place which is functioning normally. Interrogation today reveals a pacemaker is in a VVIR mode and is pacing at 65 bpm consistent with ERI. It is programmed at lower rate of 60. Underlying rhythm is atrial fibrillation with no escape at 30 beats per minute. He denies any syncope or presyncope. Patient  is doing fairly well on current regimen. He denies syncope. Patient is doing well on rivaroxaban. He does not have any bleeding and has no side effects from this. He is active without difficulty. He denies any rapid irregular heartbeat. He denies any shortness of breath.   Past Medical and Surgical History   Past Medical History Past Medical History:  Diagnosis Date  . Atrial fibrillation (CMS-HCC)  . Cardiomyopathy, secondary (CMS-HCC)  . Hyperlipidemia  . Hypertension  . Sick sinus syndrome (CMS-HCC)  . Vertigo   Past Surgical History He has a past surgical history that includes Insert / replace / remove pacemaker.   Medications and Allergies  Current Medications  Current Outpatient Medications  Medication Sig Dispense Refill  . allopurinol (ZYLOPRIM) 300 MG tablet Take one tab daily  . cholecalciferol (VITAMIN D3) 2,000 unit capsule Take 2,000 Units by mouth once daily.  . cyanocobalamin (VITAMIN B12) 1000 MCG tablet Take 1,000 mcg by mouth once daily.  Marland Kitchen lisinopril (PRINIVIL,ZESTRIL) 10 MG tablet Take 1 tablet (10 mg total) by mouth once daily. 90 tablet 3  . metoprolol tartrate (LOPRESSOR) 50 MG tablet Take 1 tablet (50 mg total) by mouth 2 (two) times daily. 180 tablet 3  . rivaroxaban (XARELTO) 20 mg tablet Take 1 tablet (20 mg total) by mouth daily with dinner. 30 tablet 6  . simvastatin (ZOCOR) 20 MG tablet Take 1 tablet (20 mg total) by mouth nightly. 90 tablet 3  . tamsulosin (FLOMAX) 0.4 mg capsule Take 1 capsule (0.4 mg total) by mouth once daily. Take 30 minutes after same meal each day. 90 capsule 3   No current facility-administered medications for this visit.   Allergies: Patient has no known allergies.  Social and Family History  Social History reports that he has quit smoking. His smoking use included cigarettes. He quit smokeless tobacco use about 49 years ago. He reports that he does not drink alcohol.  Family History History reviewed. No pertinent family history.  Review of Systems  Review of Systems  Constitutional: Negative for chills, diaphoresis, fever, malaise/fatigue and weight loss.  HENT: Negative for congestion, ear discharge, hearing loss and tinnitus.  Eyes: Negative for blurred vision.  Respiratory: Negative for cough, hemoptysis, sputum  production, shortness of breath and wheezing.  Cardiovascular: Negative for chest pain, palpitations, orthopnea, claudication, leg swelling and PND.  Gastrointestinal: Negative for abdominal pain, blood in stool, constipation, diarrhea, heartburn, melena, nausea and vomiting.  Genitourinary: Negative for dysuria, frequency, hematuria and urgency.  Musculoskeletal: Negative for back pain, falls, joint pain and myalgias.  Skin: Negative for itching and rash.  Neurological: Negative for tingling, focal weakness, loss of consciousness, weakness and headaches.  Endo/Heme/Allergies: Negative for polydipsia. Does not bruise/bleed easily.  Psychiatric/Behavioral: Negative for depression, memory loss and substance abuse. The patient is not nervous/anxious.   Physical Examination   Vitals: BP 120/64  Pulse 65  Resp 16  Ht 180.3 cm (5\' 11" )  Wt 98.4 kg (216 lb 14.9 oz)  SpO2 98%  BMI 30.26 kg/m  Ht:180.3 cm (5\' 11" ) Wt:98.4 kg (216 lb 14.9 oz) QVZ:DGLO surface area is 2.22 meters squared. Body mass index is 30.26 kg/m.  Wt Readings from Last 3 Encounters:  05/28/18 98.4 kg (216 lb 14.9 oz)  10/24/17 98.4 kg (217 lb)  07/18/17 95.7 kg (211 lb)   BP Readings from Last 3 Encounters:  05/28/18 120/64  10/24/17 112/80  07/18/17 130/80   General appearance appears in no acute distress  Head Mouth and Eye  exam Normocephalic, without obvious abnormality, atraumatic Dentition is good Eyes appear anicteric   LUNGS Breath Sounds: Normal Percussion: Normal  CARDIOVASCULAR JVP CV wave: no HJR: no Elevation at 90 degrees: None Carotid Pulse: normal pulsation bilaterally Bruit: None Apex: apical impulse normal  Auscultation Rhythm: atrial fibrillation and normal pacemaker rhythm S1: normal S2: normal Clicks: no Rub: no Murmurs: no murmurs  Gallop: None ABDOMEN Liver enlargement: no Pulsatile aorta: no Ascites: no Bruits: no  EXTREMITIES Clubbing: no Edema: 1+ bilateral  pedal edema Pulses: peripheral pulses symmetrical Femoral Bruits: no Amputation: no SKIN Rash: no Cyanosis: no Embolic phemonenon: no Bruising: no NEURO Alert and Oriented to person, place and time: yes Non focal: yes  PSYCH: Pt appears to have normal affect  Assessment and Plan   85 y.o. male with  ICD-10-CM ICD-9-CM  1. Essential hypertension, benign-continue with current medical regimen and follow blood pressure and symptoms. DASH diet is recommended. I10 401.1  2. SSS (sick sinus syndrome) (CMS-HCC)-pacemaker is functioning normally however is at ERI rate at 65. Will schedule generator change out. I49.5 427.81  3. Chronic atrial fibrillation (CMS-HCC)-we will continue with anticoagulation and rate control. No evidence of bleeding. I48.2 427.31  4. Sinoatrial node dysfunction (CMS-HCC) I49.5 427.81  5. Symptomatic bradycardia-pacemaker is present and functioning normally. Device is at [facility]. Will change generator in the next several weeks. R00.1 427.89   Return in about 6 months (around 11/27/2018).  These notes generated with voice recognition software. I apologize for typographical errors.  Sydnee Levans, MD    Electronically signed by Sydnee Levans, MD at 05/28/2018 12:20 PM EST   Plan of Treatment - documented as of this encounter Not on file   Imaging Results - documented in this encounter  Echo complete (06/07/2018 12:00 PM EST) Echo complete (06/07/2018 12:00 PM EST)  Component Value Ref Range Performed At Pathologist Signature  LV Ejection Fraction (%) 55  [facility]   Aortic Valve Stenosis Grade mild  [facility]   Aortic Valve Regurgitation Grade mild  [facility]   Aortic Valve Max Velocity (m/s) 2.5 m/sec [facility]   Aortic Valve Stenosis Mean Gradient (mmHg) 11.6 mmHg [facility]   Mitral Valve Stenosis Grade none  [facility] ORD[facility]   Mitral Valve Regurgitation Grade moderate   [facility] OTHER ORDERS   Tricuspid Valve Regurgitation Grade moderate  [facility] OTHER ORD[facility]   Tricuspid Valve Regurgitation Max Velocity (m/s) 3 m/sec [facility]   Right Ventricle Systolic Pressure (mmHg) 20.9 mmHg [facility]   LV End Diastolic Diameter (cm) 4.4 cm [facility]   LV End Systolic Diameter (cm) 3.2 cm [facility]   LV Septum Wall Thickness (cm) 1.5 cm [facility]   LV Posterior Wall Thickness (cm) 1.2 cm [facility]   Left Atrium Diameter (cm) 5 cm [facility]    Echo complete (06/07/2018 12:00 PM EST)  Specimen     Echo complete (06/07/2018 12:00 PM EST)  Narrative Performed At             [place] [place]  Acct #: [id number]      [address], [place], [place] [address]    Date: 06/07/2018 11: 04 AM                                Adult  Male Age: 86 yrs      ECHOCARDIOGRAM REPORT               Outpatient                                [facility]    STUDY:CHEST WALL        TAPE:          MD1: FATH, KENNETH ALAN    ECHO:Yes  DOPPLER:Yes    FILE:          BP: 120/64 mmHg    COLOR:Yes  CONTRAST:No   MACHINE:Philips  RV BIOPSY:No     3D:No SOUND QLTY:Moderate      Height: 71 in   MEDIUM:None                       Weight: 216 lb                                BSA: 2.2 m2  _________________________________________________________________________________________        HISTORY: DOE         REASON: Assess, LV function       INDICATION: SOB (shortness of breath) [R06.02 (ICD-10-CM)]    _________________________________________________________________________________________  ECHOCARDIOGRAPHIC MEASUREMENTS  2D DIMENSIONS  AORTA         Values  Normal Range  MAIN PA     Values  Normal Range        Annulus: 1.8 cm    [2.3-2.9]     PA Main: nm*    [1.5-2.1]       Aorta Sin: 3.1 cm    [3.1-3.7]  RIGHT VENTRICLE      ST Junction: nm*     [2.6-3.2]     RV Base: nm*    [<4.2]       Asc.Aorta: nm*     [2.6-3.4]     RV Mid: 3.9 cm  [< 3.5]  LEFT VENTRICLE                   RV Length: nm*    [<8.6]         LVIDd: 4.4 cm    [4.2-5.9]  INFERIOR VENA CAVA         LVIDs: 3.2 cm            Max. IVC: nm*    [<=2.1]           FS: 28.6 %    [>25]      Min. IVC: nm*          SWT: 1.5 cm    [0.6-1.0]  ------------------          PWT: 1.2 cm    [0.6-1.0]  nm* - not measured  LEFT ATRIUM        LA Diam: 5.0 cm    [3.0-4.0]      LA A4C Area: nm*     [<20]       LA Volume: nm*     [18-58]  _________________________________________________________________________________________  ECHOCARDIOGRAPHIC DESCRIPTIONS  AORTIC ROOT          Size: Normal       Dissection: INDETERM FOR DISSECTION  AORTIC VALVE        Leaflets: Tricuspid          Morphology: MODERATELY THICKENED        Mobility: Fully mobile  LEFT VENTRICLE          Size: Normal            Anterior: Normal      Contraction: Normal             Lateral: Normal       Closest EF: >55% (Estimated)        Septal: Normal       LV Masses: No Masses            Apical: Normal          LVH: MODERATE LVH         Inferior: Normal                            Posterior: Normal      Dias.FxClass: N/A  MITRAL VALVE        Leaflets: Normal            Mobility: Fully mobile       Morphology: Normal  LEFT ATRIUM          Size: MODERATELY ENLARGED     LA Masses: No masses       IA Septum: Normal IAS  MAIN PA          Size: Normal  PULMONIC VALVE       Morphology: Normal            Mobility: Fully mobile  RIGHT VENTRICLE       RV Masses: No Masses             Size: Normal       Free Wall: Normal           Contraction: Normal  TRICUSPID VALVE        Leaflets: Normal            Mobility: Fully mobile       Morphology: Normal  RIGHT ATRIUM          Size: MODERATELY ENLARGED      RA Other: None        RA Mass: No masses  PERICARDIUM         Fluid: No effusion  INFERIOR VENACAVA          Size: Normal Normal respiratory collapse  _________________________________________________________________________________________    DOPPLER ECHO and OTHER SPECIAL PROCEDURES         Aortic: MILD AR          MILD AS             247.4 cm/sec peak vel   24.5 mmHg peak grad             11.6 mmHg mean grad         Mitral: MODERATE MR        No MS             MV Inflow E Vel = 107.0 cm/sec   MV Annulus E'Vel = 9.0 cm/sec             E/E'Ratio = 11.9       Tricuspid:  MODERATE TR        No TS             304.1 cm/sec peak TR vel  47.0 mmHg peak RV pressure       Pulmonary: MILD PR          No PS             86.9 cm/sec peak vel    3.0 mmHg peak grad  _________________________________________________________________________________________  INTERPRETATION  NORMAL LEFT VENTRICULAR SYSTOLIC FUNCTION  WITH MODERATE LVH  NORMAL  RIGHT VENTRICULAR SYSTOLIC FUNCTION  MODERATE VALVULAR REGURGITATION (See above)  MILD VALVULAR STENOSIS (See above)  AVA(VTI)= 1.11cm^2  MODERATE MR, TR  MODERATE PHTN  MILD PR, AR  MILD AS  EF >55%  Morphology: MODERATELY THICKENED  Closest EF: >55% (Estimated)  LVH: MODERATE LVH  Aortic: MILD AR  AVS: MILD AS  Mitral: MODERATE MR  Tricuspid: MODERATE TR  _________________________________________________________________________________________  Electronically signed by      MD Jordan Hawks on 06/07/2018 12: 47 PM      Performed By: Francee Piccolo   Ordering Physician: Bartholome Bill  _________________________________________________________________________________________    [place]    Echo complete (06/07/2018 12:00 PM EST)  Procedure Note  Interface, Text Results In - 06/07/2018 12:47 PM EST  CARDIOLOGY DEPARTMENT [place], [place] [facility] [id number] [facility] Acct #: [id number] [place], [place],  [address] Date: 06/07/2018 11: 04 AM Adult Male Age: 66 yrs ECHOCARDIOGRAM REPORT Outpatient [facility] STUDY:CHEST WALL TAPE: MD1: FATH, KENNETH ALAN ECHO:Yes DOPPLER:Yes FILE: BP: 120/64 mmHg COLOR:Yes CONTRAST:No MACHINE:Philips RV BIOPSY:No 3D:No SOUND QLTY:Moderate Height: 71 in MEDIUM:None Weight: 216 lb BSA: 2.2 m2 _________________________________________________________________________________________ HISTORY: DOE REASON: Assess, LV function INDICATION: SOB (shortness of breath) [R06.02 (ICD-10-CM)] _________________________________________________________________________________________ ECHOCARDIOGRAPHIC MEASUREMENTS 2D DIMENSIONS AORTA Values Normal Range MAIN PA Values Normal Range Annulus: 1.8 cm [2.3-2.9] PA Main: nm* [1.5-2.1] Aorta Sin: 3.1 cm [3.1-3.7] RIGHT VENTRICLE ST Junction: nm* [2.6-3.2] RV Base: nm* [<4.2] Asc.Aorta: nm* [2.6-3.4] RV Mid: 3.9 cm [< 3.5] LEFT VENTRICLE RV  Length: nm* [<8.6] LVIDd: 4.4 cm [4.2-5.9] INFERIOR VENA CAVA LVIDs: 3.2 cm Max. IVC: nm* [<=2.1] FS: 28.6 % [>25] Min. IVC: nm* SWT: 1.5 cm [0.6-1.0] ------------------ PWT: 1.2 cm [0.6-1.0] nm* - not measured LEFT ATRIUM LA Diam: 5.0 cm [3.0-4.0] LA A4C Area: nm* [<20] LA Volume: nm* [18-58] _________________________________________________________________________________________ ECHOCARDIOGRAPHIC DESCRIPTIONS AORTIC ROOT Size: Normal Dissection: INDETERM FOR DISSECTION AORTIC VALVE Leaflets: Tricuspid Morphology: MODERATELY THICKENED Mobility: Fully mobile LEFT VENTRICLE Size: Normal Anterior: Normal Contraction: Normal Lateral: Normal Closest EF: >55% (Estimated) Septal: Normal LV Masses: No Masses Apical: Normal LVH: MODERATE LVH Inferior: Normal Posterior: Normal Dias.FxClass: N/A MITRAL VALVE Leaflets: Normal Mobility: Fully mobile Morphology: Normal LEFT ATRIUM Size: MODERATELY ENLARGED LA Masses: No masses IA Septum: Normal IAS MAIN PA Size: Normal PULMONIC VALVE Morphology: Normal Mobility: Fully mobile RIGHT VENTRICLE RV Masses: No Masses Size: Normal Free Wall: Normal Contraction: Normal TRICUSPID VALVE Leaflets: Normal Mobility: Fully mobile Morphology: Normal RIGHT ATRIUM Size: MODERATELY ENLARGED RA Other: None RA Mass: No masses PERICARDIUM Fluid: No effusion INFERIOR VENACAVA Size: Normal Normal respiratory collapse _________________________________________________________________________________________  DOPPLER ECHO and OTHER SPECIAL PROCEDURES Aortic: MILD AR MILD AS 247.4 cm/sec peak vel 24.5 mmHg peak grad 11.6 mmHg mean grad Mitral: MODERATE MR No MS MV Inflow E Vel = 107.0 cm/sec MV Annulus E'Vel = 9.0 cm/sec E/E'Ratio = 11.9 Tricuspid: MODERATE TR No TS 304.1 cm/sec peak TR vel 47.0 mmHg peak RV pressure  Pulmonary: MILD PR No PS 86.9 cm/sec peak vel 3.0 mmHg peak  grad _________________________________________________________________________________________ INTERPRETATION NORMAL LEFT VENTRICULAR SYSTOLIC FUNCTION WITH MODERATE LVH NORMAL RIGHT VENTRICULAR SYSTOLIC FUNCTION MODERATE VALVULAR REGURGITATION (See above) MILD VALVULAR STENOSIS (See above) AVA(VTI)= 1.11cm^2 MODERATE MR, TR MODERATE PHTN MILD PR, AR MILD AS EF >55% Morphology: MODERATELY THICKENED Closest EF: >55% (Estimated) LVH: MODERATE LVH Aortic: MILD AR AVS: MILD AS Mitral: MODERATE MR Tricuspid: MODERATE TR _________________________________________________________________________________________ Electronically signed by MD Jordan Hawks on 06/07/2018 12: 65 PM Performed By: Francee Piccolo Ordering Physician: Bartholome Bill _________________________________________________________________________________________     Echo complete (06/07/2018 12:00 PM EST)  Performing Organization Address City/State/Zipcode Phone Number  [facility]         Visit Diagnoses - documented in this encounter Diagnosis  Essential hypertension, benign - Primary   Paroxysmal atrial fibrillation (CMS-HCC)  Atrial fibrillation   Symptomatic bradycardia   Cardiac pacemaker  Cardiac pacemaker in situ   SOB (shortness of breath)  Shortness of breath   Images  Patient Contacts  Contact Name Contact Address Communication Relationship to Patient  Grant Schmitt Unknown [phone] [facility]) Son or Daughter, Emergency Contact  Document Information  Primary Care Provider Other Service Providers Document Coverage Dates  Zandra Abts, MD (Jul. 02, 2015July 02, 2015 - Present) DM: [phone] (Work) [phone] (Fax) [place], [place] [address] Family Medicine  Dec. 10, 2019December 10, 2019   [facility] [address] [place], [place] [address]   Encounter Providers Encounter Date  Sydnee Levans, MD  (Attending) DM: [phone] (Work) [phone] (Fax) [place], [place] [address] Cardiovascular Disease Dec. 10, 2019December 10, 2019    Show All Sections

## 2018-06-26 NOTE — OR Nursing (Signed)
Discharge instructions discussed with pt and brothers. All voice understanding.

## 2018-06-26 NOTE — Anesthesia Preprocedure Evaluation (Addendum)
Anesthesia Evaluation  Patient identified by MRN, date of birth, ID band Patient awake    Reviewed: Allergy & Precautions, H&P , NPO status , Patient's Chart, lab work & pertinent test results  Airway Mallampati: III       Dental  (+) Upper Dentures, Lower Dentures   Pulmonary neg pulmonary ROS, former smoker,           Cardiovascular hypertension, + pacemaker   Echo 06/07/18: NORMAL LEFT VENTRICULAR SYSTOLIC FUNCTION  WITH MODERATE LVH NORMAL RIGHT VENTRICULAR SYSTOLIC FUNCTION AVA(VTI)= 1.11cm^2 MODERATE MR, TR MODERATE PHTN MILD PR, AR MILD AS EF >55%   Neuro/Psych negative neurological ROS  negative psych ROS   GI/Hepatic Neg liver ROS, GERD  Controlled,  Endo/Other  negative endocrine ROS  Renal/GU negative Renal ROS  negative genitourinary   Musculoskeletal   Abdominal   Peds  Hematology negative hematology ROS (+)   Anesthesia Other Findings Past Medical History: No date: A-fib ([place]) No date: Allergy No date: Arthritis No date: Cardiomyopathy, secondary ([place]) No date: GERD (gastroesophageal reflux disease) 1984: H/O hemorrhoidectomy No date: History of chickenpox No date: Hyperlipidemia No date: Hypertension No date: Pacemaker No date: Sick sinus syndrome ([place])  Past Surgical History: No date: CATARACT EXTRACTION W/ INTRAOCULAR LENS IMPLANT; Left No date: EYE SURGERY 1984: hemorrhoidectomy 2012: INSERT / REPLACE / REMOVE PACEMAKER 2005: REPLACEMENT TOTAL KNEE; Right  BMI    Body Mass Index:  30.13 kg/m      Reproductive/Obstetrics negative OB ROS                            Anesthesia Physical Anesthesia Plan  ASA: IV  Anesthesia Plan: General   Post-op Pain Management:    Induction:   PONV Risk Score and Plan: Propofol infusion and TIVA  Airway Management Planned: Simple Face Mask  Additional Equipment:   Intra-op Plan:   Post-operative Plan:    Informed Consent: I have reviewed the patients History and Physical, chart, labs and discussed the procedure including the risks, benefits and alternatives for the proposed anesthesia with the patient or authorized representative who has indicated his/her understanding and acceptance.   Dental Advisory Given  Plan Discussed with: Anesthesiologist and CRNA  Anesthesia Plan Comments:        Anesthesia Quick Evaluation

## 2018-06-26 NOTE — Discharge Instructions (Signed)
The patient may remove outer bandage on 06/27/2018.  Patient may shower on 06/27/2018.  Resume Xarelto on 06/28/2018.  AMBULATORY SURGERY  DISCHARGE INSTRUCTIONS   1) The drugs that you were given will stay in your system until tomorrow so for the next 24 hours you should not:  A) Drive an automobile B) Make any legal decisions C) Drink any alcoholic beverage   2) You may resume regular meals tomorrow.  Today it is better to start with liquids and gradually work up to solid foods.  You may eat anything you prefer, but it is better to start with liquids, then soup and crackers, and gradually work up to solid foods.   3) Please notify your doctor immediately if you have any unusual bleeding, trouble breathing, redness and pain at the surgery site, drainage, fever, or pain not relieved by medication.    4) Additional Instructions:        Please contact your physician with any problems or Same Day Surgery at [phone], Monday through Friday 6 am to 4 pm, or [place] at [facility] number at [phone].

## 2018-06-26 NOTE — Op Note (Signed)
[facility]   06/26/2018                     1:12 PM  PATIENT:  Grant Schmitt    PRE-OPERATIVE DIAGNOSIS:  BATTERY ECI  POST-OPERATIVE DIAGNOSIS:  Same  PROCEDURE:  SINGLE CHAMBER BATTERY CHANGEOUT  SURGEON:  Isaias Cowman, MD    ANESTHESIA:     PREOPERATIVE INDICATIONS:  Grant Schmitt is a  85 y.o. male with a diagnosis of BATTERY [place] who failed conservative measures and elected for surgical management.    The risks benefits and alternatives were discussed with the patient preoperatively including but not limited to the risks of infection, bleeding, cardiopulmonary complications, the need for revision surgery, among others, and the patient was willing to proceed.   OPERATIVE PROCEDURE: The patient was brought to the operating room in a fasting state.  The left pectoral region was prepped and draped in usual sterile manner.  Anesthesia was obtained 1% lidocaine locally.  A 6 cm vision was performed the left pectoral region.  The old pacemaker generator was retrieved by electrocautery and blunt dissection.  Existing lead was disconnected and connected to a  MRI compatible single-chamber rate responsive pacemaker generator ( Medtronic [id number] ).  Pacemaker pocket was irrigated with gentamicin solution.  New pacemaker generator was positioned into the pocket and the pocket was closed with 2-0 and 4-0 Vicryl, respectively.  Steri-Strips and pressure dressing were applied.  Procedure interrogation revealed appropriate ventricular sensing and pacing thresholds.  There are no periprocedural complications.

## 2018-06-26 NOTE — Anesthesia Postprocedure Evaluation (Signed)
Anesthesia Post Note  Patient: Grant Schmitt  Procedure(s) Performed: SINGLE CHAMBER BATTERY CHANGEOUT (Left Chest)  Patient location during evaluation: PACU Anesthesia Type: General Level of consciousness: awake and alert Pain management: pain level controlled Vital Signs Assessment: post-procedure vital signs reviewed and stable Respiratory status: spontaneous breathing, nonlabored ventilation and respiratory function stable Cardiovascular status: blood pressure returned to baseline and stable Postop Assessment: no apparent nausea or vomiting Anesthetic complications: no     Last Vitals:  Vitals:   06/26/18 1321 06/26/18 1329  BP:  121/74  Pulse: 68 70  Resp: 14 19  Temp:    SpO2: 98% 97%    Last Pain:  Vitals:   06/26/18 1329  TempSrc:   PainSc: 0-No pain                 Durenda Hurt

## 2018-06-26 NOTE — Interval H&P Note (Signed)
History and Physical Interval Note:  06/26/2018 12:00 PM  Grant Schmitt  has presented today for surgery, with the diagnosis of BATTERY ECI  The various methods of treatment have been discussed with the patient and family. After consideration of risks, benefits and other options for treatment, the patient has consented to  Procedure(s): [place] (N/A) as a surgical intervention .  The patient's history has been reviewed, patient examined, no change in status, stable for surgery.  I have reviewed the patient's chart and labs.  Questions were answered to the patient's satisfaction.     [name]

## 2018-06-26 NOTE — Transfer of Care (Signed)
Immediate Anesthesia Transfer of Care Note  Patient: Grant Schmitt  Procedure(s) Performed: SINGLE CHAMBER BATTERY CHANGEOUT (Left Chest)  Patient Location: PACU  Anesthesia Type:General  Level of Consciousness: drowsy and patient cooperative  Airway & Oxygen Therapy: Patient Spontanous Breathing and Patient connected to face mask oxygen  Post-op Assessment: Report given to RN and Post -op Vital signs reviewed and stable  Post vital signs: stable  Last Vitals:  Vitals Value Taken Time  BP 107/74 06/26/2018  1:14 PM  Temp 36.2 C 06/26/2018  1:14 PM  Pulse 69 06/26/2018  1:17 PM  Resp 20 06/26/2018  1:17 PM  SpO2 100 % 06/26/2018  1:17 PM  Vitals shown include unvalidated device data.  Last Pain:  Vitals:   06/26/18 1042  TempSrc: Oral  PainSc: 0-No pain         Complications: No apparent anesthesia complications

## 2018-06-26 NOTE — Anesthesia Post-op Follow-up Note (Signed)
Anesthesia QCDR form completed.        

## 2018-06-27 ENCOUNTER — Encounter: Payer: Self-pay | Admitting: Cardiology

## 2018-07-03 ENCOUNTER — Telehealth: Payer: Self-pay | Admitting: Family Medicine

## 2018-07-03 DIAGNOSIS — N400 Enlarged prostate without lower urinary tract symptoms: Secondary | ICD-10-CM

## 2018-07-03 DIAGNOSIS — Z950 Presence of cardiac pacemaker: Secondary | ICD-10-CM | POA: Diagnosis not present

## 2018-07-03 DIAGNOSIS — E7800 Pure hypercholesterolemia, unspecified: Secondary | ICD-10-CM

## 2018-07-03 DIAGNOSIS — I480 Paroxysmal atrial fibrillation: Secondary | ICD-10-CM | POA: Diagnosis not present

## 2018-07-03 DIAGNOSIS — R0602 Shortness of breath: Secondary | ICD-10-CM | POA: Diagnosis not present

## 2018-07-03 DIAGNOSIS — Z8739 Personal history of other diseases of the musculoskeletal system and connective tissue: Secondary | ICD-10-CM

## 2018-07-03 DIAGNOSIS — I10 Essential (primary) hypertension: Secondary | ICD-10-CM | POA: Diagnosis not present

## 2018-07-03 DIAGNOSIS — R001 Bradycardia, unspecified: Secondary | ICD-10-CM | POA: Diagnosis not present

## 2018-07-03 DIAGNOSIS — I4891 Unspecified atrial fibrillation: Secondary | ICD-10-CM

## 2018-07-03 NOTE — Telephone Encounter (Deleted)
e

## 2018-07-03 NOTE — Telephone Encounter (Addendum)
[facility] faxed refill request for the following medications:  simvastatin (ZOCOR) 20 MG tablet  tamsulosin (FLOMAX) 0.4 MG CAPS capsule  allopurinol (ZYLOPRIM) 300 MG tablet  lisinopril (PRINIVIL,ZESTRIL) 10 MG tablet  metoprolol tartrate (LOPRESSOR) 50 MG tablet   Please advise.

## 2018-07-03 NOTE — Telephone Encounter (Signed)
Refills for these prescriptions were sent in last month to [facility] mail order pharmacy. Need to verify whether patient has changed pharmacies. Tried calling patient. No answer. Voicemail box has not been set up yet.

## 2018-07-05 NOTE — Telephone Encounter (Signed)
Pt called back asking [place] to him back at [phone].  Pt did change his insurance company to [facility]  Thanks, [employer]

## 2018-07-08 MED ORDER — ALLOPURINOL 300 MG PO TABS: 300.0000 mg | ORAL_TABLET | Freq: Every day | ORAL | 4 refills | Status: DC

## 2018-07-08 MED ORDER — LISINOPRIL 10 MG PO TABS: 10.0000 mg | ORAL_TABLET | Freq: Every day | ORAL | 2 refills | Status: DC

## 2018-07-08 MED ORDER — TAMSULOSIN HCL 0.4 MG PO CAPS: 0.4000 mg | ORAL_CAPSULE | Freq: Every day | ORAL | 1 refills | Status: DC

## 2018-07-08 MED ORDER — METOPROLOL TARTRATE 50 MG PO TABS: 50.0000 mg | ORAL_TABLET | Freq: Every day | ORAL | 2 refills | Status: DC

## 2018-07-08 MED ORDER — SIMVASTATIN 20 MG PO TABS: 20.0000 mg | ORAL_TABLET | Freq: Every day | ORAL | 1 refills | Status: DC

## 2018-09-10 ENCOUNTER — Encounter: Payer: Self-pay | Admitting: Family Medicine

## 2018-09-10 DIAGNOSIS — C44222 Squamous cell carcinoma of skin of right ear and external auricular canal: Secondary | ICD-10-CM | POA: Insufficient documentation

## 2018-09-11 ENCOUNTER — Ambulatory Visit: Payer: Self-pay

## 2018-09-18 ENCOUNTER — Ambulatory Visit: Payer: Medicare Other

## 2018-11-06 DIAGNOSIS — H3581 Retinal edema: Secondary | ICD-10-CM | POA: Diagnosis not present

## 2018-11-07 ENCOUNTER — Other Ambulatory Visit: Payer: Self-pay | Admitting: Family Medicine

## 2018-11-07 DIAGNOSIS — E7800 Pure hypercholesterolemia, unspecified: Secondary | ICD-10-CM

## 2018-11-07 DIAGNOSIS — N400 Enlarged prostate without lower urinary tract symptoms: Secondary | ICD-10-CM

## 2018-11-20 ENCOUNTER — Other Ambulatory Visit: Payer: Self-pay

## 2018-11-20 ENCOUNTER — Ambulatory Visit (INDEPENDENT_AMBULATORY_CARE_PROVIDER_SITE_OTHER): Payer: Medicare Other

## 2018-11-20 DIAGNOSIS — Z0000 Encounter for general adult medical examination without abnormal findings: Secondary | ICD-10-CM

## 2018-11-20 NOTE — Progress Notes (Signed)
Subjective:   Grant Schmitt is a 85 y.o. male who presents for Medicare Annual/Subsequent preventive examination.    This visit is being conducted through telemedicine due to the COVID-19 pandemic. This patient has given me verbal consent via doximity to conduct this visit, patient states they are participating from their home address. Some vital signs may be absent or patient reported.    Patient identification: identified by name, DOB, and current address  Review of Systems:  N/A  Cardiac Risk Factors include: advanced age (>26men, >58 women);hypertension;male gender;obesity (BMI >30kg/m2)     Objective:    Vitals: There were no vitals taken for this visit.  There is no height or weight on file to calculate BMI. Unable to obtain vitals due to visit being conducted via telephonically.   Advanced Directives 11/20/2018 06/25/2018 09/06/2017 09/01/2016  Does Patient Have a Medical Advance Directive? Yes Yes Yes Yes  Type of Paramedic of [place];Living will [place];Living will [place];Living will Living will;Healthcare Power of Attorney  Does patient want to make changes to medical advance directive? - No - Patient declined - -  Copy of [place] in Chart? Yes - validated most recent copy scanned in chart (See row information) Yes - validated most recent copy scanned in chart (See row information) No - copy requested No - copy requested    Tobacco Social History   Tobacco Use  Smoking Status Former Smoker  . Types: Cigarettes  . Last attempt to quit: 06/19/1958  . Years since quitting: 60.4  Smokeless Tobacco Never Used     Counseling given: Not Answered   Clinical Intake:  Pre-visit preparation completed: Yes  Pain : No/denies pain Pain Score: 0-No pain     Nutritional Status: BMI > 30  Obese Nutritional Risks: None Diabetes: No  How often do you need to have someone help you when you  read instructions, pamphlets, or other written materials from your doctor or pharmacy?: 1 - Never  Interpreter Needed?: No  Information entered by :: [facility], LPN  Past Medical History:  Diagnosis Date  . A-fib ([place])   . Allergy   . Arthritis   . Cardiomyopathy, secondary ([place])   . GERD (gastroesophageal reflux disease)   . H/O hemorrhoidectomy 1984  . History of chickenpox   . Hyperlipidemia   . Hypertension   . Pacemaker   . Sick sinus syndrome [facility])    Past Surgical History:  Procedure Laterality Date  . CATARACT EXTRACTION W/ INTRAOCULAR LENS IMPLANT Left   . EYE SURGERY    . hemorrhoidectomy  1984  . IMPLANTABLE CARDIOVERTER DEFIBRILLATOR (ICD) GENERATOR CHANGE Left 06/26/2018   Procedure: SINGLE CHAMBER BATTERY CHANGEOUT;  Surgeon: Isaias Cowman, MD;  Location: [facility] ORS;  Service: Cardiovascular;  Laterality: Left;  . INSERT / REPLACE / REMOVE PACEMAKER  2012  . MOHS SURGERY Right 05/14/2018   ear. Dr. Karin Golden [facility]  . REPLACEMENT TOTAL KNEE Right 2005   History reviewed. No pertinent family history. Social History   Socioeconomic History  . Marital status: Widowed    Spouse name: Not on file  . Number of children: 2  . Years of education: 69  . Highest education level: 12th grade  Occupational History  . Occupation: Retired  Scientific laboratory technician  . Financial resource strain: Not hard at all  . Food insecurity:    Worry: Never true    Inability: Never true  . Transportation needs:  Medical: No    Non-medical: No  Tobacco Use  . Smoking status: Former Smoker    Types: Cigarettes    Last attempt to quit: 06/19/1958    Years since quitting: 60.4  . Smokeless tobacco: Never Used  Substance and Sexual Activity  . Alcohol use: Yes    Alcohol/week: 0.0 - 1.0 standard drinks  . Drug use: No  . Sexual activity: Not on file  Lifestyle  . Physical activity:    Days per week: 0 days    Minutes per session: 0 min  . Stress: Not at all   Relationships  . Social connections:    Talks on phone: Patient refused    Gets together: Patient refused    Attends religious service: Patient refused    Active member of club or organization: Patient refused    Attends meetings of clubs or organizations: Patient refused    Relationship status: Patient refused  Other Topics Concern  . Not on file  Social History Narrative   [facility] at [facility]    Outpatient Encounter Medications as of 11/20/2018  Medication Sig  . allopurinol (ZYLOPRIM) 300 MG tablet Take 1 tablet (300 mg total) by mouth daily.  . Ascorbic Acid (VITAMIN C) 1000 MG tablet Take 1,000 mg by mouth daily.  . Cholecalciferol (VITAMIN D3) 2000 units TABS Take 2,000 Units by mouth daily.   Marland Kitchen lisinopril (PRINIVIL,ZESTRIL) 10 MG tablet Take 1 tablet (10 mg total) by mouth daily.  . metoprolol tartrate (LOPRESSOR) 50 MG tablet Take 1 tablet (50 mg total) by mouth daily.  . simvastatin (ZOCOR) 20 MG tablet TAKE 1 TABLET BY MOUTH  DAILY  . tamsulosin (FLOMAX) 0.4 MG CAPS capsule TAKE 1 CAPSULE BY MOUTH  DAILY  . XARELTO 20 MG TABS tablet Take 1 tablet (20 mg total) by mouth daily.   No facility-administered encounter medications on file as of 11/20/2018.     Activities of Daily Living In your present state of health, do you have any difficulty performing the following activities: 11/20/2018 06/25/2018  Hearing? N N  Vision? N N  Difficulty concentrating or making decisions? N N  Walking or climbing stairs? N N  Dressing or bathing? N N  Doing errands, shopping? N N  Preparing Food and eating ? N -  Using the Toilet? N -  In the past six months, have you accidently leaked urine? N -  Do you have problems with loss of bowel control? N -  Managing your Medications? N -  Managing your Finances? N -  Housekeeping or managing your Housekeeping? N -  Some recent data might be hidden    Patient Care Team: Birdie Sons, MD as PCP - General (Family Medicine) Ubaldo Glassing Javier Docker,  MD as Consulting Physician (Cardiology) Dingeldein, Remo Lipps, MD as Consulting Physician (Ophthalmology) Chrismon, Vickki Muff, [place] as Consulting Physician (Family Medicine) Will Bonnet, MD as Referring Physician (Internal Medicine)   Assessment:   This is a routine wellness examination for [place].  Exercise Activities and Dietary recommendations Current Exercise Habits: The patient does not participate in regular exercise at present, Exercise limited by: None identified  Goals    . DIET - INCREASE WATER INTAKE     Recommend increasing water intake to 6-8 glasses a day.    . Increase water intake     Recommend increasing water intake to 4 glasses daily.        Fall Risk: Fall Risk  11/20/2018 09/06/2017 09/01/2016  Falls in  the past year? 0 No No    FALL RISK PREVENTION PERTAINING TO THE HOME:  Any stairs in or around the home? No  If so, are there any without handrails? N/A  Home free of loose throw rugs in walkways, pet beds, electrical cords, etc? Yes  Adequate lighting in your home to reduce risk of falls? Yes   ASSISTIVE DEVICES UTILIZED TO PREVENT FALLS:  Life alert? Yes  Use of a cane, walker or w/c? No  Grab bars in the bathroom? Yes  Shower chair or bench in shower? Yes  Elevated toilet seat or a handicapped toilet? No   TIMED UP AND GO:  Was the test performed? No .    Depression Screen PHQ 2/9 Scores 11/20/2018 09/06/2017 09/01/2016  PHQ - 2 Score 0 0 0  PHQ- 9 Score 0 - 1    Cognitive Function: Declined today.      6CIT Screen 09/01/2016  What Year? 0 points  What month? 0 points  What time? 0 points  Count back from 20 0 points  Months in reverse 0 points  Repeat phrase 0 points  Total Score 0    Immunization History  Administered Date(s) Administered  . Influenza, High Dose Seasonal PF 03/11/2018  . Influenza-Unspecified 02/05/2012, 03/22/2014, 03/15/2015, 03/31/2016  . Pneumococcal Conjugate-13 06/23/2014    Qualifies for Shingles  Vaccine? Yes . Due for Shingrix. Education has been provided regarding the importance of this vaccine. Pt has been advised to call insurance company to determine out of pocket expense. Advised may also receive vaccine at local pharmacy or Health Dept. Verbalized acceptance and understanding.  Tdap: Although this vaccine is not a covered service during a Wellness Exam, does the patient still wish to receive this vaccine today?  No . Advised may receive this vaccine at local pharmacy or Health Dept. Aware to provide a copy of the vaccination record if obtained from local pharmacy or Health Dept. Verbalized acceptance and understanding.  Flu Vaccine: Up to date  Pneumococcal Vaccine: [facility] about [facility] the Pneumovax 23.   Screening Tests Health Maintenance  Topic Date Due  . PNA vac Low Risk Adult (2 of 2 - PPSV23) 06/24/2015  . TETANUS/TDAP  08/18/2026 (Originally 04/16/1948)  . INFLUENZA VACCINE  01/18/2019   Cancer Screenings:  Colorectal Screening: No longer required.   Lung Cancer Screening: (Low Dose CT Chest recommended if Age 14-80 years, 30 pack-year currently smoking OR have quit w/in 15years.) does not qualify.   Additional Screening:  Vision Screening: Recommended annual ophthalmology exams for early detection of glaucoma and other disorders of the eye.  Dental Screening: Recommended annual dental exams for proper oral hygiene  Community Resource Referral:  CRR required this visit?  No        Plan:  I have personally reviewed and addressed the Medicare Annual Wellness questionnaire and have noted the following in the patient's chart:  A. Medical and social history B. Use of alcohol, tobacco or illicit drugs  C. Current medications and supplements D. Functional ability and status E.  Nutritional status F.  Physical activity G. Advance directives H. List of other physicians I.  Hospitalizations, surgeries, and ER visits in previous 12 months J.   [place] such as hearing and vision if needed, cognitive and depression L. Referrals and appointments   In addition, I have reviewed and discussed with patient certain preventive protocols, quality metrics, and best practice recommendations. A written personalized care plan for preventive  services as well as general preventive health recommendations were provided to patient.   Glendora Score, LPN  02/24/3381 Nurse Health Advisor   Nurse Notes: None.

## 2018-11-20 NOTE — Patient Instructions (Addendum)
Grant Schmitt , Thank you for taking time to come for your Medicare Wellness Visit. I appreciate your ongoing commitment to your health goals. Please review the following plan we discussed and let me know if I can assist you in the future.   Screening recommendations/referrals: Colonoscopy: No longer required.  Recommended yearly ophthalmology/optometry visit for glaucoma screening and checkup Recommended yearly dental visit for hygiene and checkup  Vaccinations: Influenza vaccine: Up to date Pneumococcal vaccine: Pt [facility] with [facility] about receiving the Pneumovax 23.  Tdap vaccine: Pt declines today.  Shingles vaccine: Pt declines today.     Advanced directives: Currently on file.   Conditions/risks identified: Continue increasing water intake to 6-8 8 oz glasses a day.   Next appointment: 02/17/19 @ 9:00 AM with Dr Caryn Section.   Preventive Care 65 Years and Older, Male Preventive care refers to lifestyle choices and visits with your health care provider that can promote health and wellness. What does preventive care include?  A yearly physical exam. This is also called an annual well check.  Dental exams once or twice a year.  Routine eye exams. Ask your health care provider how often you should have your eyes checked.  Personal lifestyle choices, including:  Daily care of your teeth and gums.  Regular physical activity.  Eating a healthy diet.  Avoiding tobacco and drug use.  Limiting alcohol use.  Practicing safe sex.  Taking low doses of aspirin every day.  Taking vitamin and mineral supplements as recommended by your health care provider. What happens during an annual well check? The services and screenings done by your health care provider during your annual well check will depend on your age, overall health, lifestyle risk factors, and family history of disease. Counseling  Your health care provider may ask you questions about your:  Alcohol use.  Tobacco use.   Drug use.  Emotional well-being.  Home and relationship well-being.  Sexual activity.  Eating habits.  History of falls.  Memory and ability to understand (cognition).  Work and work Statistician. Screening  You may have the following tests or measurements:  Height, weight, and BMI.  Blood pressure.  Lipid and cholesterol levels. These may be checked every 5 years, or more frequently if you are over 87 years old.  Skin check.  Lung cancer screening. You may have this screening every year starting at age 80 if you have a 30-pack-year history of smoking and currently smoke or have quit within the past 15 years.  Fecal occult blood test (FOBT) of the stool. You may have this test every year starting at age 52.  Flexible sigmoidoscopy or colonoscopy. You may have a sigmoidoscopy every 5 years or a colonoscopy every 10 years starting at age 55.  Prostate cancer screening. Recommendations will vary depending on your family history and other risks.  Hepatitis C blood test.  Hepatitis B blood test.  Sexually transmitted disease (STD) testing.  Diabetes screening. This is done by checking your blood sugar (glucose) after you have not eaten for a while (fasting). You may have this done every 1-3 years.  Abdominal aortic aneurysm (AAA) screening. You may need this if you are a current or former smoker.  Osteoporosis. You may be screened starting at age 52 if you are at high risk. Talk with your health care provider about your test results, treatment options, and if necessary, the need for more tests. Vaccines  Your health care provider may recommend certain vaccines, such as:  Influenza  vaccine. This is recommended every year.  Tetanus, diphtheria, and acellular pertussis (Tdap, Td) vaccine. You may need a Td booster every 10 years.  Zoster vaccine. You may need this after age 8.  Pneumococcal 13-valent conjugate (PCV13) vaccine. One dose is recommended after age 27.   Pneumococcal polysaccharide (PPSV23) vaccine. One dose is recommended after age 75. Talk to your health care provider about which screenings and vaccines you need and how often you need them. This information is not intended to replace advice given to you by your health care provider. Make sure you discuss any questions you have with your health care provider. Document Released: 07/02/2015 Document Revised: 02/23/2016 Document Reviewed: 04/06/2015 Elsevier Interactive Patient Education  2017 [place] Prevention in the Home Falls can cause injuries. They can happen to people of all ages. There are many things you can do to make your home safe and to help prevent falls. What can I do on the outside of my home?  Regularly fix the edges of walkways and driveways and fix any cracks.  Remove anything that might make you trip as you walk through a door, such as a raised step or threshold.  Trim any bushes or trees on the path to your home.  Use bright outdoor lighting.  Clear any walking paths of anything that might make someone trip, such as rocks or tools.  Regularly check to see if handrails are loose or broken. Make sure that both sides of any steps have handrails.  Any raised decks and porches should have guardrails on the edges.  Have any leaves, snow, or ice cleared regularly.  Use sand or salt on walking paths during winter.  Clean up any spills in your garage right away. This includes oil or grease spills. What can I do in the bathroom?  Use night lights.  Install grab bars by the toilet and in the tub and shower. Do not use towel bars as grab bars.  Use non-skid mats or decals in the tub or shower.  If you need to sit down in the shower, use a plastic, non-slip stool.  Keep the floor dry. Clean up any water that spills on the floor as soon as it happens.  Remove soap buildup in the tub or shower regularly.  Attach bath mats securely with double-sided non-slip  rug tape.  Do not have throw rugs and other things on the floor that can make you trip. What can I do in the bedroom?  Use night lights.  Make sure that you have a light by your bed that is easy to reach.  Do not use any sheets or blankets that are too big for your bed. They should not hang down onto the floor.  Have a firm chair that has side arms. You can use this for support while you get dressed.  Do not have throw rugs and other things on the floor that can make you trip. What can I do in the kitchen?  Clean up any spills right away.  Avoid walking on wet floors.  Keep items that you use a lot in easy-to-reach places.  If you need to reach something above you, use a strong step stool that has a grab bar.  Keep electrical cords out of the way.  Do not use floor polish or wax that makes floors slippery. If you must use wax, use non-skid floor wax.  Do not have throw rugs and other things on the floor that can  make you trip. What can I do with my stairs?  Do not leave any items on the stairs.  Make sure that there are handrails on both sides of the stairs and use them. Fix handrails that are broken or loose. Make sure that handrails are as long as the stairways.  Check any carpeting to make sure that it is firmly attached to the stairs. Fix any carpet that is loose or worn.  Avoid having throw rugs at the top or bottom of the stairs. If you do have throw rugs, attach them to the floor with carpet tape.  Make sure that you have a light switch at the top of the stairs and the bottom of the stairs. If you do not have them, ask someone to add them for you. What else can I do to help prevent falls?  Wear shoes that:  Do not have high heels.  Have rubber bottoms.  Are comfortable and fit you well.  Are closed at the toe. Do not wear sandals.  If you use a stepladder:  Make sure that it is fully opened. Do not climb a closed stepladder.  Make sure that both sides of  the stepladder are locked into place.  Ask someone to hold it for you, if possible.  Clearly mark and make sure that you can see:  Any grab bars or handrails.  First and last steps.  Where the edge of each step is.  Use tools that help you move around (mobility aids) if they are needed. These include:  Canes.  Walkers.  Scooters.  Crutches.  Turn on the lights when you go into a dark area. Replace any light bulbs as soon as they burn out.  Set up your furniture so you have a clear path. Avoid moving your furniture around.  If any of your floors are uneven, fix them.  If there are any pets around you, be aware of where they are.  Review your medicines with your doctor. Some medicines can make you feel dizzy. This can increase your chance of falling. Ask your doctor what other things that you can do to help prevent falls. This information is not intended to replace advice given to you by your health care provider. Make sure you discuss any questions you have with your health care provider. Document Released: 04/01/2009 Document Revised: 11/11/2015 Document Reviewed: 07/10/2014 Elsevier Interactive Patient Education  2017 [employer].

## 2018-12-05 DIAGNOSIS — I429 Cardiomyopathy, unspecified: Secondary | ICD-10-CM | POA: Insufficient documentation

## 2018-12-05 DIAGNOSIS — I10 Essential (primary) hypertension: Secondary | ICD-10-CM | POA: Diagnosis not present

## 2018-12-05 DIAGNOSIS — Z7901 Long term (current) use of anticoagulants: Secondary | ICD-10-CM | POA: Insufficient documentation

## 2018-12-05 DIAGNOSIS — D492 Neoplasm of unspecified behavior of bone, soft tissue, and skin: Secondary | ICD-10-CM | POA: Insufficient documentation

## 2018-12-05 DIAGNOSIS — D689 Coagulation defect, unspecified: Secondary | ICD-10-CM | POA: Insufficient documentation

## 2018-12-05 DIAGNOSIS — L299 Pruritus, unspecified: Secondary | ICD-10-CM | POA: Insufficient documentation

## 2018-12-05 DIAGNOSIS — R339 Retention of urine, unspecified: Secondary | ICD-10-CM | POA: Insufficient documentation

## 2018-12-05 DIAGNOSIS — D485 Neoplasm of uncertain behavior of skin: Secondary | ICD-10-CM | POA: Insufficient documentation

## 2018-12-05 DIAGNOSIS — M109 Gout, unspecified: Secondary | ICD-10-CM | POA: Insufficient documentation

## 2018-12-05 DIAGNOSIS — I480 Paroxysmal atrial fibrillation: Secondary | ICD-10-CM | POA: Diagnosis not present

## 2018-12-05 DIAGNOSIS — L853 Xerosis cutis: Secondary | ICD-10-CM | POA: Insufficient documentation

## 2018-12-05 DIAGNOSIS — B029 Zoster without complications: Secondary | ICD-10-CM | POA: Insufficient documentation

## 2018-12-05 DIAGNOSIS — R351 Nocturia: Secondary | ICD-10-CM | POA: Insufficient documentation

## 2018-12-05 DIAGNOSIS — K5900 Constipation, unspecified: Secondary | ICD-10-CM | POA: Insufficient documentation

## 2018-12-05 DIAGNOSIS — L570 Actinic keratosis: Secondary | ICD-10-CM | POA: Insufficient documentation

## 2018-12-18 ENCOUNTER — Ambulatory Visit (INDEPENDENT_AMBULATORY_CARE_PROVIDER_SITE_OTHER): Payer: Medicare Other | Admitting: Physician Assistant

## 2018-12-18 ENCOUNTER — Other Ambulatory Visit: Payer: Self-pay

## 2018-12-18 ENCOUNTER — Encounter: Payer: Self-pay | Admitting: Physician Assistant

## 2018-12-18 VITALS — BP 106/65 | HR 74 | Temp 97.7°F | Resp 16 | Wt 213.0 lb

## 2018-12-18 DIAGNOSIS — L259 Unspecified contact dermatitis, unspecified cause: Secondary | ICD-10-CM

## 2018-12-18 MED ORDER — TRIAMCINOLONE ACETONIDE 0.1 % EX CREA: 1.0000 "application " | TOPICAL_CREAM | Freq: Two times a day (BID) | CUTANEOUS | 0 refills | Status: DC

## 2018-12-18 NOTE — Patient Instructions (Signed)
Rash, Adult  A rash is a change in the color of your skin. A rash can also change the way your skin feels. There are many different conditions and factors that can cause a rash. Follow these instructions at home: The goal of treatment is to stop the itching and keep the rash from spreading. Watch for any changes in your symptoms. Let your doctor know about them. Follow these instructions to help with your condition: Medicine Take or apply over-the-counter and prescription medicines only as told by your doctor. These may include medicines:  To treat red or swollen skin (corticosteroid creams).  To treat itching.  To treat an allergy (oral antihistamines).  To treat very bad symptoms (oral corticosteroids).  Skin care  Put cool cloths (compresses) on the affected areas.  Do not scratch or rub your skin.  Avoid covering the rash. Make sure that the rash is exposed to air as much as possible. Managing itching and discomfort  Avoid hot showers or baths. These can make itching worse. A cold shower may help.  Try taking a bath with: ? Epsom salts. You can get these at your local pharmacy or grocery store. Follow the instructions on the package. ? Baking soda. Pour a small amount into the bath as told by your doctor. ? Colloidal oatmeal. You can get this at your local pharmacy or grocery store. Follow the instructions on the package.  Try putting baking soda paste onto your skin. Stir water into baking soda until it gets like a paste.  Try putting on a lotion that relieves itchiness (calamine lotion).  Keep cool and out of the sun. Sweating and being hot can make itching worse. General instructions   Rest as needed.  Drink enough fluid to keep your pee (urine) pale yellow.  Wear loose-fitting clothing.  Avoid scented soaps, detergents, and perfumes. Use gentle soaps, detergents, perfumes, and other cosmetic products.  Avoid anything that causes your rash. Keep a journal to  help track what causes your rash. Write down: ? What you eat. ? What cosmetic products you use. ? What you drink. ? What you wear. This includes jewelry.  Keep all follow-up visits as told by your doctor. This is important. Contact a doctor if:  You sweat at night.  You lose weight.  You pee (urinate) more than normal.  You pee less than normal, or you notice that your pee is a darker color than normal.  You feel weak.  You throw up (vomit).  Your skin or the whites of your eyes look yellow (jaundice).  Your skin: ? Tingles. ? Is numb.  Your rash: ? Does not go away after a few days. ? Gets worse.  You are: ? More thirsty than normal. ? More tired than normal.  You have: ? New symptoms. ? Pain in your belly (abdomen). ? A fever. ? Watery poop (diarrhea). Get help right away if:  You have a fever and your symptoms suddenly get worse.  You start to feel mixed up (confused).  You have a very bad headache or a stiff neck.  You have very bad joint pains or stiffness.  You have jerky movements that you cannot control (seizure).  Your rash covers all or most of your body. The rash may or may not be painful.  You have blisters that: ? Are on top of the rash. ? Grow larger. ? Grow together. ? Are painful. ? Are inside your nose or mouth.  You have a rash   that: ? Looks like purple pinprick-sized spots all over your body. ? Has a "bull's eye" or looks like a target. ? Is red and painful, causes your skin to peel, and is not from being in the sun too long. Summary  A rash is a change in the color of your skin. A rash can also change the way your skin feels.  The goal of treatment is to stop the itching and keep the rash from spreading.  Take or apply over-the-counter and prescription medicines only as told by your doctor.  Contact a doctor if you have new symptoms or symptoms that get worse.  Keep all follow-up visits as told by your doctor. This is  important. This information is not intended to replace advice given to you by your health care provider. Make sure you discuss any questions you have with your health care provider. Document Released: 11/22/2007 Document Revised: 09/27/2018 Document Reviewed: 01/07/2018 Elsevier Patient Education  2020 Elsevier Inc.  

## 2018-12-18 NOTE — Progress Notes (Signed)
Patient: Grant Schmitt Male    DOB: December 25, 1928   86 y.o.   MRN: [id number] Visit Date: 12/18/2018  Today's Provider: Trinna Post, PA-C   Chief Complaint  Patient presents with  . Rash   Subjective:     Rash This is a new problem. The current episode started in the past 7 days (2 days). The affected locations include the neck and chest. The rash is characterized by burning, pain, redness and itchiness. It is unknown if there was an exposure to a precipitant. Pertinent negatives include no anorexia, congestion, cough, diarrhea, eye pain, facial edema, fatigue, fever, joint pain, nail changes, rhinorrhea, shortness of breath, sore throat or vomiting. Past treatments include anti-itch cream. The treatment provided mild relief. His past medical history is significant for varicella.   Patient has had rash on front right side neck and upper chest. Patient states rash is itchy, burning red, and painful. Patient has been using otc cortisone cream with mild relief. Denies new medications, lotions, jewelery.    No Known Allergies   Current Outpatient Medications:  .  allopurinol (ZYLOPRIM) 300 MG tablet, Take 1 tablet (300 mg total) by mouth daily., Disp: 90 tablet, Rfl: 4 .  Ascorbic Acid (VITAMIN C) 1000 MG tablet, Take 1,000 mg by mouth daily., Disp: , Rfl:  .  Cholecalciferol (VITAMIN D3) 2000 units TABS, Take 2,000 Units by mouth daily. , Disp: , Rfl:  .  lisinopril (PRINIVIL,ZESTRIL) 10 MG tablet, Take 1 tablet (10 mg total) by mouth daily., Disp: 90 tablet, Rfl: 2 .  metoprolol tartrate (LOPRESSOR) 50 MG tablet, Take 1 tablet (50 mg total) by mouth daily., Disp: 90 tablet, Rfl: 2 .  simvastatin (ZOCOR) 20 MG tablet, TAKE 1 TABLET BY MOUTH  DAILY, Disp: 90 tablet, Rfl: 3 .  tamsulosin (FLOMAX) 0.4 MG CAPS capsule, TAKE 1 CAPSULE BY MOUTH  DAILY, Disp: 90 capsule, Rfl: 3 .  XARELTO 20 MG TABS tablet, Take 1 tablet (20 mg total) by mouth daily., Disp: 90 tablet, Rfl: 4 .   triamcinolone cream (KENALOG) 0.1 %, Apply 1 application topically 2 (two) times daily., Disp: 30 g, Rfl: 0  Review of Systems  Constitutional: Negative for appetite change, chills, fatigue and fever.  HENT: Negative for congestion, rhinorrhea and sore throat.   Eyes: Negative for pain.  Respiratory: Negative for cough, chest tightness, shortness of breath and wheezing.   Cardiovascular: Negative for chest pain and palpitations.  Gastrointestinal: Negative for abdominal pain, anorexia, diarrhea, nausea and vomiting.  Musculoskeletal: Negative for joint pain.  Skin: Positive for rash. Negative for nail changes.    Social History   Tobacco Use  . Smoking status: Former Smoker    Types: Cigarettes    Quit date: 06/19/1958    Years since quitting: 60.5  . Smokeless tobacco: Never Used  Substance Use Topics  . Alcohol use: Yes    Alcohol/week: 0.0 - 1.0 standard drinks      Objective:   BP 106/65 (BP Location: Left Arm, Patient Position: Sitting, Cuff Size: Large)   Pulse 74   Temp 97.7 F (36.5 C) (Oral)   Resp 16   Wt 213 lb (96.6 kg)   SpO2 97%   BMI 29.71 kg/m  Vitals:   12/18/18 1346  BP: 106/65  Pulse: 74  Resp: 16  Temp: 97.7 F (36.5 C)  TempSrc: Oral  SpO2: 97%  Weight: 213 lb (96.6 kg)     Physical Exam Constitutional:  Appearance: Normal appearance.  Skin:    General: Skin is warm and dry.     Findings: Erythema and rash present.       Neurological:     Mental Status: He is alert.      No results found for any visits on 12/18/18.     Assessment & Plan    1. Contact dermatitis, unspecified contact dermatitis type, unspecified trigger  - triamcinolone cream (KENALOG) 0.1 %; Apply 1 application topically 2 (two) times daily.  Dispense: 30 g; Refill: 0  The entirety of the information documented in the History of Present Illness, Review of Systems and Physical Exam were personally obtained by me. Portions of this information were initially  documented by April M. Sabra Heck, CMA and reviewed by me for thoroughness and accuracy.   F/u PRN    Trinna Post, PA-C  [facility]

## 2018-12-24 ENCOUNTER — Ambulatory Visit (INDEPENDENT_AMBULATORY_CARE_PROVIDER_SITE_OTHER): Payer: Medicare Other | Admitting: Physician Assistant

## 2018-12-24 ENCOUNTER — Other Ambulatory Visit: Payer: Self-pay

## 2018-12-24 VITALS — Temp 98.6°F

## 2018-12-24 DIAGNOSIS — Z23 Encounter for immunization: Secondary | ICD-10-CM | POA: Diagnosis not present

## 2019-01-31 ENCOUNTER — Other Ambulatory Visit: Payer: Self-pay | Admitting: Family Medicine

## 2019-01-31 DIAGNOSIS — I10 Essential (primary) hypertension: Secondary | ICD-10-CM

## 2019-01-31 DIAGNOSIS — I4891 Unspecified atrial fibrillation: Secondary | ICD-10-CM

## 2019-02-17 ENCOUNTER — Other Ambulatory Visit: Payer: Self-pay

## 2019-02-17 ENCOUNTER — Encounter: Payer: Self-pay | Admitting: Family Medicine

## 2019-02-17 ENCOUNTER — Ambulatory Visit (INDEPENDENT_AMBULATORY_CARE_PROVIDER_SITE_OTHER): Payer: Medicare Other | Admitting: Family Medicine

## 2019-02-17 VITALS — BP 122/79 | HR 81 | Temp 96.9°F | Ht 71.0 in | Wt 213.0 lb

## 2019-02-17 DIAGNOSIS — E7800 Pure hypercholesterolemia, unspecified: Secondary | ICD-10-CM | POA: Diagnosis not present

## 2019-02-17 DIAGNOSIS — E790 Hyperuricemia without signs of inflammatory arthritis and tophaceous disease: Secondary | ICD-10-CM | POA: Diagnosis not present

## 2019-02-17 DIAGNOSIS — Z23 Encounter for immunization: Secondary | ICD-10-CM

## 2019-02-17 DIAGNOSIS — I340 Nonrheumatic mitral (valve) insufficiency: Secondary | ICD-10-CM

## 2019-02-17 DIAGNOSIS — I4891 Unspecified atrial fibrillation: Secondary | ICD-10-CM | POA: Diagnosis not present

## 2019-02-17 DIAGNOSIS — I352 Nonrheumatic aortic (valve) stenosis with insufficiency: Secondary | ICD-10-CM

## 2019-02-17 DIAGNOSIS — I361 Nonrheumatic tricuspid (valve) insufficiency: Secondary | ICD-10-CM

## 2019-02-17 DIAGNOSIS — Z0000 Encounter for general adult medical examination without abnormal findings: Secondary | ICD-10-CM | POA: Diagnosis not present

## 2019-02-17 DIAGNOSIS — I10 Essential (primary) hypertension: Secondary | ICD-10-CM | POA: Diagnosis not present

## 2019-02-17 NOTE — Progress Notes (Signed)
Patient: Grant Schmitt, Male    DOB: 17-Feb-1929, 85 y.o.   MRN: [id number] Visit Date: 02/17/2019  Today's Provider: Lelon Huh, MD   Chief Complaint  Patient presents with  . Annual Exam   Subjective:     Complete Physical Grant Schmitt is a 85 y.o. male. He feels well. He reports exercising some. He reports he is sleeping well.  -----------------------------------------------------------   Review of Systems  Constitutional: Negative.   HENT: Negative.   Eyes: Negative.   Respiratory: Negative.   Cardiovascular: Negative.   Gastrointestinal: Negative.   Endocrine: Negative.   Genitourinary: Negative.   Musculoskeletal: Negative.   Skin: Negative.   Allergic/Immunologic: Negative.   Neurological: Negative.   Hematological: Negative.   Psychiatric/Behavioral: Negative.     Social History   Socioeconomic History  . Marital status: Widowed    Spouse name: Not on file  . Number of children: 2  . Years of education: 40  . Highest education level: 12th grade  Occupational History  . Occupation: Retired  Scientific laboratory technician  . Financial resource strain: Not hard at all  . Food insecurity    Worry: Never true    Inability: Never true  . Transportation needs    Medical: No    Non-medical: No  Tobacco Use  . Smoking status: Former Smoker    Types: Cigarettes    Quit date: 06/19/1958    Years since quitting: 60.7  . Smokeless tobacco: Never Used  Substance and Sexual Activity  . Alcohol use: Yes    Alcohol/week: 0.0 - 1.0 standard drinks  . Drug use: No  . Sexual activity: Not on file  Lifestyle  . Physical activity    Days per week: 0 days    Minutes per session: 0 min  . Stress: Not at all  Relationships  . Social Herbalist on phone: Patient refused    Gets together: Patient refused    Attends religious service: Patient refused    Active member of club or organization: Patient refused    Attends meetings of clubs or organizations:  Patient refused    Relationship status: Patient refused  . Intimate partner violence    Fear of current or ex partner: Patient refused    Emotionally abused: Patient refused    Physically abused: Patient refused    Forced sexual activity: Patient refused  Other Topics Concern  . Not on file  Social History Narrative   Lives at [facility]    Past Medical History:  Diagnosis Date  . A-fib ([place])   . Allergy   . Arthritis   . Cardiomyopathy, secondary ([place])   . GERD (gastroesophageal reflux disease)   . H/O hemorrhoidectomy 1984  . History of chickenpox   . Hyperlipidemia   . Hypertension   . Pacemaker   . Sick sinus syndrome [facility])      Patient Active Problem List   Diagnosis Date Noted  . Squamous cell carcinoma, ear, right 09/10/2018  . SOB (shortness of breath) 06/23/2016  . History of gout 04/26/2016  . BPH (benign prostatic hyperplasia) 04/26/2016  . Arthritis 04/25/2016  . Hypertension 04/25/2016  . Atrial fibrillation ([place]) 04/25/2016  . GERD (gastroesophageal reflux disease) 04/25/2016  . Hypercholesterolemia 04/25/2016  . Cardiac pacemaker 03/20/2016  . Symptomatic bradycardia 09/30/2015  . Benign essential hypertension 11/12/2013    Past Surgical History:  Procedure Laterality Date  . CATARACT EXTRACTION W/ INTRAOCULAR LENS IMPLANT Left   .  EYE SURGERY    . hemorrhoidectomy  1984  . IMPLANTABLE CARDIOVERTER DEFIBRILLATOR (ICD) GENERATOR CHANGE Left 06/26/2018   Procedure: SINGLE CHAMBER BATTERY CHANGEOUT;  Surgeon: Isaias Cowman, MD;  Location: [facility] ORS;  Service: Cardiovascular;  Laterality: Left;  . INSERT / REPLACE / REMOVE PACEMAKER  2012  . MOHS SURGERY Right 05/14/2018   ear. Dr. Karin Golden [facility]  . REPLACEMENT TOTAL KNEE Right 2005    His family history is not on file.   Current Outpatient Medications:  .  allopurinol (ZYLOPRIM) 300 MG tablet, Take 1 tablet (300 mg total) by mouth daily., Disp: 90 tablet, Rfl: 4 .  Ascorbic  Acid (VITAMIN C) 1000 MG tablet, Take 1,000 mg by mouth daily., Disp: , Rfl:  .  Cholecalciferol (VITAMIN D3) 2000 units TABS, Take 2,000 Units by mouth daily. , Disp: , Rfl:  .  lisinopril (ZESTRIL) 10 MG tablet, TAKE 1 TABLET BY MOUTH  DAILY, Disp: 90 tablet, Rfl: 3 .  metoprolol tartrate (LOPRESSOR) 50 MG tablet, TAKE 1 TABLET BY MOUTH  DAILY, Disp: 90 tablet, Rfl: 3 .  simvastatin (ZOCOR) 20 MG tablet, TAKE 1 TABLET BY MOUTH  DAILY, Disp: 90 tablet, Rfl: 3 .  tamsulosin (FLOMAX) 0.4 MG CAPS capsule, TAKE 1 CAPSULE BY MOUTH  DAILY, Disp: 90 capsule, Rfl: 3 .  triamcinolone cream (KENALOG) 0.1 %, Apply 1 application topically 2 (two) times daily., Disp: 30 g, Rfl: 0 .  XARELTO 20 MG TABS tablet, Take 1 tablet (20 mg total) by mouth daily., Disp: 90 tablet, Rfl: 4  Patient Care Team: Birdie Sons, MD as PCP - General (Family Medicine) Ubaldo Glassing Javier Docker, MD as Consulting Physician (Cardiology) Dingeldein, Remo Lipps, MD as Consulting Physician (Ophthalmology) Chrismon, Vickki Muff, PA as Consulting Physician (Family Medicine) Will Bonnet, MD as Referring Physician (Internal Medicine)     Objective:    Vitals: BP 122/79 (BP Location: Right Arm, Patient Position: Sitting, Cuff Size: Large)   Pulse 81   Temp (!) 96.9 F (36.1 C) (Temporal)   Ht 5\' 11"  (1.803 m)   Wt 213 lb (96.6 kg)   BMI 29.71 kg/m   Physical Exam   General Appearance:    Alert, cooperative, no distress, appears stated age  Head:    Normocephalic, without obvious abnormality, atraumatic  Eyes:    PERRL, conjunctiva/corneas clear, EOM's intact, fundi    benign, both eyes       Ears:    Normal TM's and external ear canals, both ears  Nose:   Nares normal, septum midline, mucosa normal, no drainage   or sinus tenderness  Throat:   Lips, mucosa, and tongue normal; teeth and gums normal  Neck:   Supple, symmetrical, trachea midline, no adenopathy;       thyroid:  No enlargement/tenderness/nodules; no carotid    bruit or JVD  Back:     Symmetric, no curvature, ROM normal, no CVA tenderness  Lungs:     Clear to auscultation bilaterally, respirations unlabored  Chest wall:    No tenderness or deformity  Heart:    Normal heart rate. Regular rhythm.  3/6 harsh, crescendo-decrescendo, systolic murmur at right upper sternal border radiating to both carotid arteries 2/6 blowing, holosystolic murmur at apex 2/6 high pitched, blowing, decrescendo, diastolic murmur at the left third intercostal space and lower sternal border S1 and S2 normal  Abdomen:     Soft, non-tender, bowel sounds active all four quadrants,    no masses, no organomegaly  Genitalia:    deferred  Rectal:    deferred  Extremities:   All extremities are intact. No cyanosis or edema  Pulses:   2+ and symmetric all extremities  Skin:   Skin color, texture, turgor normal, no rashes or lesions  Lymph nodes:   Cervical, supraclavicular, and axillary nodes normal  Neurologic:   CNII-XII intact. Normal strength, sensation and reflexes      throughout    Activities of Daily Living In your present state of health, do you have any difficulty performing the following activities: 11/20/2018 06/25/2018  Hearing? N N  Vision? N N  Difficulty concentrating or making decisions? N N  Walking or climbing stairs? N N  Dressing or bathing? N N  Doing errands, shopping? N N  Preparing Food and eating ? N -  Using the Toilet? N -  In the past six months, have you accidently leaked urine? N -  Do you have problems with loss of bowel control? N -  Managing your Medications? N -  Managing your Finances? N -  Housekeeping or managing your Housekeeping? N -  Some recent data might be hidden    Fall Risk Assessment Fall Risk  11/20/2018 09/06/2017 09/01/2016  Falls in the past year? 0 No No     Depression Screen PHQ 2/9 Scores 11/20/2018 09/06/2017 09/01/2016  PHQ - 2 Score 0 0 0  PHQ- 9 Score 0 - 1    6CIT Screen 09/01/2016  What Year? 0 points  What  month? 0 points  What time? 0 points  Count back from 20 0 points  Months in reverse 0 points  Repeat phrase 0 points  Total Score 0       Assessment & Plan:    Annual Physical Reviewed patient's Family Medical History Reviewed and updated list of patient's medical providers Assessment of cognitive impairment was done Assessed patient's functional ability Established a written schedule for health screening [place] Completed and Reviewed  Exercise Activities and Dietary recommendations Goals    . DIET - INCREASE WATER INTAKE     Recommend increasing water intake to 6-8 glasses a day.    . Increase water intake     Recommend increasing water intake to 4 glasses daily.        Immunization History  Administered Date(s) Administered  . Influenza, High Dose Seasonal PF 03/11/2018  . Influenza-Unspecified 02/05/2012, 03/22/2014, 03/15/2015, 03/31/2016  . Pneumococcal Conjugate-13 06/23/2014  . Zoster Recombinat (Shingrix) 12/24/2018    Health Maintenance  Topic Date Due  . PNA vac Low Risk Adult (2 of 2 - PPSV23) 06/24/2015  . INFLUENZA VACCINE  01/18/2019  . TETANUS/TDAP  08/18/2026 (Originally 04/16/1948)     Discussed health benefits of physical activity, and encouraged him to engage in regular exercise appropriate for his age and condition.    ------------------------------------------------------------------------------------------------------------  1. Annual physical exam   2. Hypertension, unspecified type Well controlled.  Continue current medications.    3. Hypercholesterolemia He is tolerating simvastatin well with no adverse effects.   - Comprehensive metabolic panel - Lipid panel  4. Benign essential hypertension Well controlled.  Continue current medications.    5. Atrial fibrillation, unspecified type (HCC) Paced regular rhythm today. Asymptomatic. Compliant with medication.  Continue aggressive risk factor modification.   Continue routine cardology follow up.   6. Hyperuricemia No gout flares since being on allopurinol.  - Uric acid  7. Need for influenza vaccination  - Flu Vaccine QUAD High Dose(Fluad)  8. Nonrheumatic mitral valve regurgitation  9. Nonrheumatic aortic insufficiency with aortic stenosis  10. Nonrheumatic tricuspid valve regurgitation    Lelon Huh, MD  [facility]

## 2019-02-17 NOTE — Patient Instructions (Signed)
.   Please review the attached list of medications and notify my office if there are any errors.   . Please bring all of your medications to every appointment so we can make sure that our medication list is the same as yours.   . It is especially important to get the annual flu vaccine this year. If you haven't had it already, please go to your pharmacy or call the office as soon as possible to schedule you flu shot.  

## 2019-02-18 ENCOUNTER — Telehealth: Payer: Self-pay

## 2019-02-18 LAB — COMPREHENSIVE METABOLIC PANEL
ALT: 19 IU/L (ref 0–44)
AST: 21 IU/L (ref 0–40)
Albumin/Globulin Ratio: 1.7 (ref 1.2–2.2)
Albumin: 4.2 g/dL (ref 3.6–4.6)
Alkaline Phosphatase: 65 IU/L (ref 39–117)
BUN/Creatinine Ratio: 26 — ABNORMAL HIGH (ref 10–24)
BUN: 37 mg/dL — ABNORMAL HIGH (ref 8–27)
Bilirubin Total: 0.4 mg/dL (ref 0.0–1.2)
CO2: 23 mmol/L (ref 20–29)
Calcium: 9.9 mg/dL (ref 8.6–10.2)
Chloride: 102 mmol/L (ref 96–106)
Creatinine, Ser: 1.4 mg/dL — ABNORMAL HIGH (ref 0.76–1.27)
GFR calc Af Amer: 52 mL/min/{1.73_m2} — ABNORMAL LOW (ref >59)
GFR calc non Af Amer: 45 mL/min/{1.73_m2} — ABNORMAL LOW (ref >59)
Globulin, Total: 2.5 g/dL (ref 1.5–4.5)
Glucose: 93 mg/dL (ref 65–99)
Potassium: 4.8 mmol/L (ref 3.5–5.2)
Sodium: 139 mmol/L (ref 134–144)
Total Protein: 6.7 g/dL (ref 6.0–8.5)

## 2019-02-18 LAB — LIPID PANEL
Chol/HDL Ratio: 2.2 ratio (ref 0.0–5.0)
Cholesterol, Total: 130 mg/dL (ref 100–199)
HDL: 58 mg/dL (ref >39)
LDL Chol Calc (NIH): 58 mg/dL (ref 0–99)
Triglycerides: 71 mg/dL (ref 0–149)
VLDL Cholesterol Cal: 14 mg/dL (ref 5–40)

## 2019-02-18 LAB — URIC ACID: Uric Acid: 4.4 mg/dL (ref 3.7–8.6)

## 2019-02-18 NOTE — Telephone Encounter (Signed)
-----   Message from Birdie Sons, MD sent at 02/18/2019  7:37 AM EDT ----- Kidney functions have declined a bit. Need to drink more water. Otherwise labs are good. Continue current medications.  Check yearly.

## 2019-02-18 NOTE — Telephone Encounter (Signed)
Patient advised as directed below. 

## 2019-02-25 ENCOUNTER — Ambulatory Visit (INDEPENDENT_AMBULATORY_CARE_PROVIDER_SITE_OTHER): Payer: Medicare Other | Admitting: Family Medicine

## 2019-02-25 ENCOUNTER — Other Ambulatory Visit: Payer: Self-pay

## 2019-02-25 DIAGNOSIS — Z23 Encounter for immunization: Secondary | ICD-10-CM | POA: Diagnosis not present

## 2019-02-25 NOTE — Patient Instructions (Signed)
.   Please review the attached list of medications and notify my office if there are any errors.   . Please bring all of your medications to every appointment so we can make sure that our medication list is the same as yours.   . It is especially important to get the annual flu vaccine this year. If you haven't had it already, please go to your pharmacy or call the office as soon as possible to schedule you flu shot.  

## 2019-02-25 NOTE — Progress Notes (Signed)
Vaccine visit only, no md visit.

## 2019-03-21 ENCOUNTER — Ambulatory Visit: Payer: Medicare Other | Admitting: Family Medicine

## 2019-05-06 DIAGNOSIS — I495 Sick sinus syndrome: Secondary | ICD-10-CM | POA: Diagnosis not present

## 2019-06-02 DIAGNOSIS — I480 Paroxysmal atrial fibrillation: Secondary | ICD-10-CM | POA: Diagnosis not present

## 2019-06-02 DIAGNOSIS — I429 Cardiomyopathy, unspecified: Secondary | ICD-10-CM | POA: Diagnosis not present

## 2019-06-02 DIAGNOSIS — Z950 Presence of cardiac pacemaker: Secondary | ICD-10-CM | POA: Diagnosis not present

## 2019-06-02 DIAGNOSIS — I10 Essential (primary) hypertension: Secondary | ICD-10-CM | POA: Diagnosis not present

## 2019-07-13 ENCOUNTER — Other Ambulatory Visit: Payer: Self-pay | Admitting: Family Medicine

## 2019-07-13 DIAGNOSIS — Z8739 Personal history of other diseases of the musculoskeletal system and connective tissue: Secondary | ICD-10-CM

## 2019-07-29 ENCOUNTER — Other Ambulatory Visit: Payer: Self-pay | Admitting: Family Medicine

## 2019-07-29 DIAGNOSIS — Z8739 Personal history of other diseases of the musculoskeletal system and connective tissue: Secondary | ICD-10-CM

## 2019-07-30 NOTE — Telephone Encounter (Signed)
Requested Prescriptions  Pending Prescriptions Disp Refills  . allopurinol (ZYLOPRIM) 300 MG tablet [Pharmacy Med Name: ALLOPURINOL  300MG   TAB] 90 tablet 3    Sig: TAKE 1 TABLET BY MOUTH  DAILY     Endocrinology:  Gout Agents Failed - 07/29/2019  9:59 PM      Failed - Cr in normal range and within 360 days    Creatinine, Ser  Date Value Ref Range Status  02/17/2019 1.40 (H) 0.76 - 1.27 mg/dL Final         Passed - Uric Acid in normal range and within 360 days    Uric Acid  Date Value Ref Range Status  02/17/2019 4.4 3.7 - 8.6 mg/dL Final    Comment:               Therapeutic target for gout patients: <6.0         Passed - Valid encounter within last 12 months    Recent Outpatient Visits          5 months ago Need for zoster vaccination   [place], [place]   5 months ago Annual physical exam   [facility] Birdie Sons, [place]   7 months ago Contact dermatitis, unspecified contact dermatitis type, unspecified trigger   [facility] Trinna Post, [place]   1 year ago Annual physical exam   [facility] Birdie Sons, MD   2 years ago Benign essential hypertension   [place], Vickki Muff, [place]

## 2019-08-08 ENCOUNTER — Telehealth: Payer: Self-pay

## 2019-08-08 NOTE — Telephone Encounter (Signed)
Patient advised that we need a copy of Vaccination card before we can update record. Patient plans to bring a copy by the office later today.

## 2019-08-08 NOTE — Telephone Encounter (Signed)
Copied from [place]. Topic: General - Other >> Aug 08, 2019 10:33 AM Reyne Dumas L wrote: Reason for CRM:  First vaccine 07/12/2019, 2nd vaccine 08/02/2019- pt received both COVID vaccines Therapist, music) at [facility].  He wants his records updated.  Dr. Ubaldo Glassing at [facility] changed Lipitor to Losartan.

## 2019-09-09 DIAGNOSIS — I495 Sick sinus syndrome: Secondary | ICD-10-CM | POA: Diagnosis not present

## 2019-10-02 ENCOUNTER — Other Ambulatory Visit: Payer: Self-pay | Admitting: Family Medicine

## 2019-10-02 DIAGNOSIS — N400 Enlarged prostate without lower urinary tract symptoms: Secondary | ICD-10-CM

## 2019-10-02 DIAGNOSIS — E7800 Pure hypercholesterolemia, unspecified: Secondary | ICD-10-CM

## 2019-10-07 ENCOUNTER — Telehealth: Payer: Self-pay

## 2019-10-07 NOTE — Telephone Encounter (Signed)
Copied from [place] [phone]. Topic: General - Other >> Oct 07, 2019  4:01 PM Alanda Slim E wrote: Reason for CRM: Pt called to ask Dr. Maralyn Sago nurse a question about leakage when he uses the bathroom / please advise

## 2019-10-08 NOTE — Telephone Encounter (Signed)
I called patient to get more information. Patient says that Grant Schmitt should be sending Dr. Caryn Section orders to sign for patient to recieve Depends. Patient says he has had issues with urinary incontinence for several months and would like this company to provide him with Depends to wear when he goes out. I advised patient that we would be on the look out for these orders.

## 2019-10-09 NOTE — Telephone Encounter (Signed)
Grant Schmitt with [employer] called to see if the orders that were faxed on 10/07/19 were received. Cb# [phone]

## 2019-10-09 NOTE — Telephone Encounter (Addendum)
Yes, it was in my pile of faxes from Thursday

## 2019-10-10 NOTE — Telephone Encounter (Signed)
I called [employer] and advised them that we received the fax.

## 2019-10-13 NOTE — Telephone Encounter (Signed)
The order they sent was for bed bags and penile clamps, not Depends. I can't sign that.

## 2019-10-14 DIAGNOSIS — H2511 Age-related nuclear cataract, right eye: Secondary | ICD-10-CM | POA: Diagnosis not present

## 2019-10-14 NOTE — Telephone Encounter (Signed)
I called [employer] and spoke with an agent named Zelda. I advised her that the order we received was not for depends as patient requested, but for bed bags and penile clamps. Zelda advised me that their company doesn't carry depends. She statessupplies on the order form are an alternative to a condom catheter. She says it stays on all day and fits on the tip of the anatomy allowing urine leakage to drain into a pouch. I called patient and advised him of this. Patient says he doesn't want these supplies. Patient wants Dr. Caryn Section to discard those orders. He will continue to look for a different company that carries Depends and have them fax orders to Dr. Caryn Section.

## 2019-11-24 NOTE — Progress Notes (Signed)
Subjective:   Grant Schmitt is a 86 y.o. male who presents for Medicare Annual/Subsequent preventive examination.  I connected with [name] today by telephone and verified that I am speaking with the correct person using two identifiers. Location patient: home Location provider: work Persons participating in the virtual visit: patient, provider.   I discussed the limitations, risks, security and privacy concerns of performing an evaluation and management service by telephone and the availability of in person appointments. I also discussed with the patient that there may be a patient responsible charge related to this service. The patient expressed understanding and verbally consented to this telephonic visit.    Interactive audio and video telecommunications were attempted between this provider and patient, however failed, due to patient having technical difficulties OR patient did not have access to video capability.  We continued and completed visit with audio only.  Review of Systems:  N/A  Cardiac Risk Factors include: dyslipidemia;hypertension;male gender     Objective:    Vitals: There were no vitals taken for this visit.  There is no height or weight on file to calculate BMI.  Advanced Directives 11/25/2019 11/20/2018 06/25/2018 09/06/2017 09/01/2016  Does Patient Have a Medical Advance Directive? Yes Yes Yes Yes Yes  Type of Paramedic of [place];Living will [place];Living will [place];Living will [place];Living will Living will;Healthcare Power of Attorney  Does patient want to make changes to medical advance directive? - - No - Patient declined - -  Copy of [place] in Chart? Yes - validated most recent copy scanned in chart (See row information) Yes - validated most recent copy scanned in chart (See row information) Yes - validated most recent copy scanned in chart (See  row information) No - copy requested No - copy requested    Tobacco Social History   Tobacco Use  Smoking Status Former Smoker  . Types: Cigarettes  . Quit date: 06/19/1958  . Years since quitting: 61.4  Smokeless Tobacco Never Used     Counseling given: Not Answered   Clinical Intake:  Pre-visit preparation completed: Yes  Pain : No/denies pain Pain Score: 0-No pain     Nutritional Risks: None Diabetes: No  How often do you need to have someone help you when you read instructions, pamphlets, or other written materials from your doctor or pharmacy?: 1 - Never  Interpreter Needed?: No  Information entered by :: [facility], LPN  Past Medical History:  Diagnosis Date  . A-fib ([place])   . Allergy   . Arthritis   . Cardiomyopathy, secondary ([place])   . GERD (gastroesophageal reflux disease)   . H/O hemorrhoidectomy 1984  . History of chickenpox   . Hyperlipidemia   . Hypertension   . Pacemaker   . Sick sinus syndrome [facility])    Past Surgical History:  Procedure Laterality Date  . CATARACT EXTRACTION W/ INTRAOCULAR LENS IMPLANT Left   . EYE SURGERY    . hemorrhoidectomy  1984  . IMPLANTABLE CARDIOVERTER DEFIBRILLATOR (ICD) GENERATOR CHANGE Left 06/26/2018   Procedure: SINGLE CHAMBER BATTERY CHANGEOUT;  Surgeon: Isaias Cowman, MD;  Location: [facility] ORS;  Service: Cardiovascular;  Laterality: Left;  . INSERT / REPLACE / REMOVE PACEMAKER  2012  . MOHS SURGERY Right 05/14/2018   ear. Dr. Karin Golden [facility]  . REPLACEMENT TOTAL KNEE Right 2005   History reviewed. No pertinent family history. Social History   Socioeconomic History  . Marital  status: Widowed    Spouse name: Not on file  . Number of children: 2  . Years of education: 50  . Highest education level: 12th grade  Occupational History  . Occupation: Retired  Tobacco Use  . Smoking status: Former Smoker    Types: Cigarettes    Quit date: 06/19/1958    Years since quitting: 61.4  . Smokeless  tobacco: Never Used  Substance and Sexual Activity  . Alcohol use: Yes    Alcohol/week: 0.0 - 1.0 standard drinks  . Drug use: No  . Sexual activity: Not on file  Other Topics Concern  . Not on file  Social History Narrative   Lives at [place] Strain: Low Risk   . Difficulty of Paying Living Expenses: Not hard at all  Food Insecurity: No Food Insecurity  . Worried About Charity fundraiser in the Last Year: Never true  . Ran Out of Food in the Last Year: Never true  Transportation Needs: No Transportation Needs  . Lack of Transportation (Medical): No  . Lack of Transportation (Non-Medical): No  Physical Activity: Inactive  . Days of Exercise per Week: 0 days  . Minutes of Exercise per Session: 0 min  Stress: No Stress Concern Present  . Feeling of Stress : Only a little  Social Connections: Moderately Isolated  . Frequency of Communication with Friends and Family: More than three times a week  . Frequency of Social Gatherings with Friends and Family: Once a week  . Attends Religious Services: Never  . Active Member of Clubs or Organizations: No  . Attends Archivist Meetings: Never  . Marital Status: Widowed    Outpatient Encounter Medications as of 11/25/2019  Medication Sig  . allopurinol (ZYLOPRIM) 300 MG tablet TAKE 1 TABLET BY MOUTH  DAILY  . Ascorbic Acid (VITAMIN C) 1000 MG tablet Take 1,000 mg by mouth daily.  . Cholecalciferol (VITAMIN D3) 2000 units TABS Take 2,000 Units by mouth daily.   Marland Kitchen lisinopril (ZESTRIL) 10 MG tablet TAKE 1 TABLET BY MOUTH  DAILY  . losartan (COZAAR) 50 MG tablet Take 50 mg by mouth daily.   . metoprolol tartrate (LOPRESSOR) 50 MG tablet TAKE 1 TABLET BY MOUTH  DAILY  . simvastatin (ZOCOR) 20 MG tablet TAKE 1 TABLET BY MOUTH  DAILY  . tamsulosin (FLOMAX) 0.4 MG CAPS capsule TAKE 1 CAPSULE BY MOUTH  DAILY  . XARELTO 20 MG TABS tablet Take 1 tablet (20 mg total) by mouth  daily.  Marland Kitchen triamcinolone cream (KENALOG) 0.1 % Apply 1 application topically 2 (two) times daily. (Patient not taking: Reported on 11/25/2019)   No facility-administered encounter medications on file as of 11/25/2019.    Activities of Daily Living In your present state of health, do you have any difficulty performing the following activities: 11/25/2019 02/17/2019  Hearing? N N  Vision? N Y  Difficulty concentrating or making decisions? N Y  Walking or climbing stairs? N Y  Dressing or bathing? N N  Doing errands, shopping? N N  Preparing Food and eating ? N -  Using the Toilet? N -  In the past six months, have you accidently leaked urine? N -  Do you have problems with loss of bowel control? N -  Managing your Medications? N -  Managing your Finances? N -  Housekeeping or managing your Housekeeping? N -  Some recent data might be hidden  Patient Care Team: Birdie Sons, MD as PCP - General (Family Medicine) Ubaldo Glassing Javier Docker, MD as Consulting Physician (Cardiology) Dingeldein, Remo Lipps, MD as Consulting Physician (Ophthalmology) Chrismon, Vickki Muff, [place] as Consulting Physician (Family Medicine) Will Bonnet, MD as Referring Physician (Internal Medicine)   Assessment:   This is a routine wellness examination for [place].  Exercise Activities and Dietary recommendations Current Exercise Habits: The patient does not participate in regular exercise at present, Exercise limited by: None identified  Goals    . Exercise 3x per week (30 min per time)     Recommend to exercise for 3 days a week for at least 30 minutes at a time.        Fall Risk: Fall Risk  11/25/2019 02/17/2019 11/20/2018 09/06/2017 09/01/2016  Falls in the past year? 0 0 0 No No  Number falls in past yr: 0 - - - -  Injury with Fall? 0 - - - -  Follow up - Falls evaluation completed - - -    FALL RISK PREVENTION PERTAINING TO THE HOME:  Any stairs in or around the home? No  If so, are there any without  handrails? N/A  Home free of loose throw rugs in walkways, pet beds, electrical cords, etc? Yes  Adequate lighting in your home to reduce risk of falls? Yes   ASSISTIVE DEVICES UTILIZED TO PREVENT FALLS:  Life alert? Yes  Use of a cane, walker or w/c? No  Grab bars in the bathroom? Yes  Shower chair or bench in shower? Yes  Elevated toilet seat or a handicapped toilet? Yes   TIMED UP AND GO:  Was the test performed? No .    Depression Screen PHQ 2/9 Scores 11/25/2019 02/17/2019 11/20/2018 09/06/2017  PHQ - 2 Score 0 0 0 0  PHQ- 9 Score - 2 0 -    Cognitive Function: Declined today.     6CIT Screen 09/01/2016  What Year? 0 points  What month? 0 points  What time? 0 points  Count back from 20 0 points  Months in reverse 0 points  Repeat phrase 0 points  Total Score 0    Immunization History  Administered Date(s) Administered  . Fluad Quad(high Dose 65+) 02/17/2019  . Influenza, High Dose Seasonal PF 03/11/2018  . Influenza-Unspecified 02/05/2012, 03/22/2014, 03/15/2015, 03/31/2016  . PFIZER SARS-COV-2 Vaccination 07/12/2019, 08/02/2019  . Pneumococcal Conjugate-13 06/23/2014  . Zoster Recombinat (Shingrix) 12/24/2018, 02/25/2019    Qualifies for Shingles Vaccine? Completed series  Tdap: Although this vaccine is not a covered service during a Wellness Exam, does the patient still wish to receive this vaccine today?  No . Advised may receive this vaccine at local pharmacy or Health Dept. Aware to provide a copy of the vaccination record if obtained from local pharmacy or Health Dept. Verbalized acceptance and understanding.  Flu Vaccine: Up to date  Pneumococcal Vaccine: Due for Pneumococcal vaccine. Does the patient want to receive this vaccine today?  No . Advised may receive this vaccine at local pharmacy or Health Dept. Aware to provide a copy of the vaccination record if obtained from local pharmacy or Health Dept. Verbalized acceptance and understanding.   Screening  Tests Health Maintenance  Topic Date Due  . PNA vac Low Risk Adult (2 of 2 - PPSV23) 06/24/2015  . TETANUS/TDAP  08/18/2026 (Originally 04/16/1948)  . INFLUENZA VACCINE  01/18/2020  . COVID-19 Vaccine  Completed   Cancer Screenings:  Colorectal Screening: No longer required.   Lung  Cancer Screening: (Low Dose CT Chest recommended if Age 67-80 years, 30 pack-year currently smoking OR have quit w/in 15years.) does not qualify.   Additional Screening:  Vision Screening: Recommended annual ophthalmology exams for early detection of glaucoma and other disorders of the eye.  Dental Screening: Recommended annual dental exams for proper oral hygiene  Community Resource Referral:  CRR required this visit? No      Plan:  I have personally reviewed and addressed the Medicare Annual Wellness questionnaire and have noted the following in the patient's chart:  A. Medical and social history B. Use of alcohol, tobacco or illicit drugs  C. Current medications and supplements D. Functional ability and status E.  Nutritional status F.  Physical activity G. Advance directives H. List of other physicians I.  Hospitalizations, surgeries, and ER visits in previous 12 months J.  [place] such as hearing and vision if needed, cognitive and depression L. Referrals and appointments   In addition, I have reviewed and discussed with patient certain preventive protocols, quality metrics, and best practice recommendations. A written personalized care plan for preventive services as well as general preventive health recommendations were provided to patient.   [place] Score, [place]  0/06/930 Nurse Health Advisor   Nurse Notes: Pt would like to receive a pneumonia vaccine at next in office apt.

## 2019-11-25 ENCOUNTER — Ambulatory Visit (INDEPENDENT_AMBULATORY_CARE_PROVIDER_SITE_OTHER): Payer: Medicare Other

## 2019-11-25 ENCOUNTER — Other Ambulatory Visit: Payer: Self-pay

## 2019-11-25 DIAGNOSIS — Z0000 Encounter for general adult medical examination without abnormal findings: Secondary | ICD-10-CM

## 2019-11-25 NOTE — Patient Instructions (Signed)
Grant Schmitt , Thank you for taking time to come for your Medicare Wellness Visit. I appreciate your ongoing commitment to your health goals. Please review the following plan we discussed and let me know if I can assist you in the future.   Screening recommendations/referrals: Colonoscopy: No longer required.  Recommended yearly ophthalmology/optometry visit for glaucoma screening and checkup Recommended yearly dental visit for hygiene and checkup  Vaccinations: Influenza vaccine: Up to date Pneumococcal vaccine: Pneumovax 23 currently due. Tdap vaccine: Pt declines today.  Shingles vaccine: Completed series    Advanced directives: Currently on file.  Conditions/risks identified: Recommend to exercise for 3 days a week for at least 30 minutes at a time.   Next appointment: 02/18/20 @ 10:00 AM with Dr Caryn Section  Preventive Care 84 Years and Older, Male Preventive care refers to lifestyle choices and visits with your health care provider that can promote health and wellness. What does preventive care include?  A yearly physical exam. This is also called an annual well check.  Dental exams once or twice a year.  Routine eye exams. Ask your health care provider how often you should have your eyes checked.  Personal lifestyle choices, including:  Daily care of your teeth and gums.  Regular physical activity.  Eating a healthy diet.  Avoiding tobacco and drug use.  Limiting alcohol use.  Practicing safe sex.  Taking low doses of aspirin every day.  Taking vitamin and mineral supplements as recommended by your health care provider. What happens during an annual well check? The services and screenings done by your health care provider during your annual well check will depend on your age, overall health, lifestyle risk factors, and family history of disease. Counseling  Your health care provider may ask you questions about your:  Alcohol use.  Tobacco use.  Drug  use.  Emotional well-being.  Home and relationship well-being.  Sexual activity.  Eating habits.  History of falls.  Memory and ability to understand (cognition).  Work and work Statistician. Screening  You may have the following tests or measurements:  Height, weight, and BMI.  Blood pressure.  Lipid and cholesterol levels. These may be checked every 5 years, or more frequently if you are over 3 years old.  Skin check.  Lung cancer screening. You may have this screening every year starting at age 73 if you have a 30-pack-year history of smoking and currently smoke or have quit within the past 15 years.  Fecal occult blood test (FOBT) of the stool. You may have this test every year starting at age 96.  Flexible sigmoidoscopy or colonoscopy. You may have a sigmoidoscopy every 5 years or a colonoscopy every 10 years starting at age 77.  Prostate cancer screening. Recommendations will vary depending on your family history and other risks.  Hepatitis C blood test.  Hepatitis B blood test.  Sexually transmitted disease (STD) testing.  Diabetes screening. This is done by checking your blood sugar (glucose) after you have not eaten for a while (fasting). You may have this done every 1-3 years.  Abdominal aortic aneurysm (AAA) screening. You may need this if you are a current or former smoker.  Osteoporosis. You may be screened starting at age 46 if you are at high risk. Talk with your health care provider about your test results, treatment options, and if necessary, the need for more tests. Vaccines  Your health care provider may recommend certain vaccines, such as:  Influenza vaccine. This is recommended every year.  Tetanus, diphtheria, and acellular pertussis (Tdap, Td) vaccine. You may need a Td booster every 10 years.  Zoster vaccine. You may need this after age 11.  Pneumococcal 13-valent conjugate (PCV13) vaccine. One dose is recommended after age  6.  Pneumococcal polysaccharide (PPSV23) vaccine. One dose is recommended after age 69. Talk to your health care provider about which screenings and vaccines you need and how often you need them. This information is not intended to replace advice given to you by your health care provider. Make sure you discuss any questions you have with your health care provider. Document Released: 07/02/2015 Document Revised: 02/23/2016 Document Reviewed: 04/06/2015 Elsevier Interactive Patient Education  2017 [place] Prevention in the Home Falls can cause injuries. They can happen to people of all ages. There are many things you can do to make your home safe and to help prevent falls. What can I do on the outside of my home?  Regularly fix the edges of walkways and driveways and fix any cracks.  Remove anything that might make you trip as you walk through a door, such as a raised step or threshold.  Trim any bushes or trees on the path to your home.  Use bright outdoor lighting.  Clear any walking paths of anything that might make someone trip, such as rocks or tools.  Regularly check to see if handrails are loose or broken. Make sure that both sides of any steps have handrails.  Any raised decks and porches should have guardrails on the edges.  Have any leaves, snow, or ice cleared regularly.  Use sand or salt on walking paths during winter.  Clean up any spills in your garage right away. This includes oil or grease spills. What can I do in the bathroom?  Use night lights.  Install grab bars by the toilet and in the tub and shower. Do not use towel bars as grab bars.  Use non-skid mats or decals in the tub or shower.  If you need to sit down in the shower, use a plastic, non-slip stool.  Keep the floor dry. Clean up any water that spills on the floor as soon as it happens.  Remove soap buildup in the tub or shower regularly.  Attach bath mats securely with double-sided  non-slip rug tape.  Do not have throw rugs and other things on the floor that can make you trip. What can I do in the bedroom?  Use night lights.  Make sure that you have a light by your bed that is easy to reach.  Do not use any sheets or blankets that are too big for your bed. They should not hang down onto the floor.  Have a firm chair that has side arms. You can use this for support while you get dressed.  Do not have throw rugs and other things on the floor that can make you trip. What can I do in the kitchen?  Clean up any spills right away.  Avoid walking on wet floors.  Keep items that you use a lot in easy-to-reach places.  If you need to reach something above you, use a strong step stool that has a grab bar.  Keep electrical cords out of the way.  Do not use floor polish or wax that makes floors slippery. If you must use wax, use non-skid floor wax.  Do not have throw rugs and other things on the floor that can make you trip. What can I do  with my stairs?  Do not leave any items on the stairs.  Make sure that there are handrails on both sides of the stairs and use them. Fix handrails that are broken or loose. Make sure that handrails are as long as the stairways.  Check any carpeting to make sure that it is firmly attached to the stairs. Fix any carpet that is loose or worn.  Avoid having throw rugs at the top or bottom of the stairs. If you do have throw rugs, attach them to the floor with carpet tape.  Make sure that you have a light switch at the top of the stairs and the bottom of the stairs. If you do not have them, ask someone to add them for you. What else can I do to help prevent falls?  Wear shoes that:  Do not have high heels.  Have rubber bottoms.  Are comfortable and fit you well.  Are closed at the toe. Do not wear sandals.  If you use a stepladder:  Make sure that it is fully opened. Do not climb a closed stepladder.  Make sure that both  sides of the stepladder are locked into place.  Ask someone to hold it for you, if possible.  Clearly mark and make sure that you can see:  Any grab bars or handrails.  First and last steps.  Where the edge of each step is.  Use tools that help you move around (mobility aids) if they are needed. These include:  Canes.  Walkers.  Scooters.  Crutches.  Turn on the lights when you go into a dark area. Replace any light bulbs as soon as they burn out.  Set up your furniture so you have a clear path. Avoid moving your furniture around.  If any of your floors are uneven, fix them.  If there are any pets around you, be aware of where they are.  Review your medicines with your doctor. Some medicines can make you feel dizzy. This can increase your chance of falling. Ask your doctor what other things that you can do to help prevent falls. This information is not intended to replace advice given to you by your health care provider. Make sure you discuss any questions you have with your health care provider. Document Released: 04/01/2009 Document Revised: 11/11/2015 Document Reviewed: 07/10/2014 Elsevier Interactive Patient Education  2017 [employer].

## 2019-12-22 ENCOUNTER — Other Ambulatory Visit: Payer: Self-pay | Admitting: Family Medicine

## 2019-12-22 DIAGNOSIS — I10 Essential (primary) hypertension: Secondary | ICD-10-CM

## 2019-12-22 DIAGNOSIS — I4891 Unspecified atrial fibrillation: Secondary | ICD-10-CM

## 2020-01-07 DIAGNOSIS — M25561 Pain in right knee: Secondary | ICD-10-CM | POA: Diagnosis not present

## 2020-01-07 DIAGNOSIS — S83411A Sprain of medial collateral ligament of right knee, initial encounter: Secondary | ICD-10-CM | POA: Diagnosis not present

## 2020-01-07 DIAGNOSIS — M7051 Other bursitis of knee, right knee: Secondary | ICD-10-CM | POA: Diagnosis not present

## 2020-01-12 DIAGNOSIS — M7051 Other bursitis of knee, right knee: Secondary | ICD-10-CM | POA: Diagnosis not present

## 2020-01-12 DIAGNOSIS — S83411A Sprain of medial collateral ligament of right knee, initial encounter: Secondary | ICD-10-CM | POA: Diagnosis not present

## 2020-01-12 DIAGNOSIS — M25561 Pain in right knee: Secondary | ICD-10-CM | POA: Diagnosis not present

## 2020-01-14 DIAGNOSIS — S83411A Sprain of medial collateral ligament of right knee, initial encounter: Secondary | ICD-10-CM | POA: Diagnosis not present

## 2020-01-14 DIAGNOSIS — M25561 Pain in right knee: Secondary | ICD-10-CM | POA: Diagnosis not present

## 2020-01-14 DIAGNOSIS — M7051 Other bursitis of knee, right knee: Secondary | ICD-10-CM | POA: Diagnosis not present

## 2020-01-19 DIAGNOSIS — M25561 Pain in right knee: Secondary | ICD-10-CM | POA: Diagnosis not present

## 2020-01-19 DIAGNOSIS — S83411A Sprain of medial collateral ligament of right knee, initial encounter: Secondary | ICD-10-CM | POA: Diagnosis not present

## 2020-01-19 DIAGNOSIS — M7051 Other bursitis of knee, right knee: Secondary | ICD-10-CM | POA: Diagnosis not present

## 2020-01-21 DIAGNOSIS — M7051 Other bursitis of knee, right knee: Secondary | ICD-10-CM | POA: Diagnosis not present

## 2020-01-21 DIAGNOSIS — M25561 Pain in right knee: Secondary | ICD-10-CM | POA: Diagnosis not present

## 2020-01-21 DIAGNOSIS — S83411A Sprain of medial collateral ligament of right knee, initial encounter: Secondary | ICD-10-CM | POA: Diagnosis not present

## 2020-01-26 DIAGNOSIS — S83411A Sprain of medial collateral ligament of right knee, initial encounter: Secondary | ICD-10-CM | POA: Diagnosis not present

## 2020-01-26 DIAGNOSIS — M7051 Other bursitis of knee, right knee: Secondary | ICD-10-CM | POA: Diagnosis not present

## 2020-01-26 DIAGNOSIS — M25561 Pain in right knee: Secondary | ICD-10-CM | POA: Diagnosis not present

## 2020-01-28 DIAGNOSIS — M7051 Other bursitis of knee, right knee: Secondary | ICD-10-CM | POA: Diagnosis not present

## 2020-01-28 DIAGNOSIS — M25561 Pain in right knee: Secondary | ICD-10-CM | POA: Diagnosis not present

## 2020-01-28 DIAGNOSIS — S83411A Sprain of medial collateral ligament of right knee, initial encounter: Secondary | ICD-10-CM | POA: Diagnosis not present

## 2020-02-02 DIAGNOSIS — S83411A Sprain of medial collateral ligament of right knee, initial encounter: Secondary | ICD-10-CM | POA: Diagnosis not present

## 2020-02-02 DIAGNOSIS — M7051 Other bursitis of knee, right knee: Secondary | ICD-10-CM | POA: Diagnosis not present

## 2020-02-02 DIAGNOSIS — M25561 Pain in right knee: Secondary | ICD-10-CM | POA: Diagnosis not present

## 2020-02-04 DIAGNOSIS — S83411A Sprain of medial collateral ligament of right knee, initial encounter: Secondary | ICD-10-CM | POA: Diagnosis not present

## 2020-02-04 DIAGNOSIS — M7051 Other bursitis of knee, right knee: Secondary | ICD-10-CM | POA: Diagnosis not present

## 2020-02-04 DIAGNOSIS — M25561 Pain in right knee: Secondary | ICD-10-CM | POA: Diagnosis not present

## 2020-02-09 DIAGNOSIS — M7051 Other bursitis of knee, right knee: Secondary | ICD-10-CM | POA: Diagnosis not present

## 2020-02-09 DIAGNOSIS — M1712 Unilateral primary osteoarthritis, left knee: Secondary | ICD-10-CM | POA: Diagnosis not present

## 2020-02-09 DIAGNOSIS — S83411A Sprain of medial collateral ligament of right knee, initial encounter: Secondary | ICD-10-CM | POA: Diagnosis not present

## 2020-02-09 DIAGNOSIS — M25561 Pain in right knee: Secondary | ICD-10-CM | POA: Diagnosis not present

## 2020-02-13 DIAGNOSIS — M25561 Pain in right knee: Secondary | ICD-10-CM | POA: Diagnosis not present

## 2020-02-13 DIAGNOSIS — S83411A Sprain of medial collateral ligament of right knee, initial encounter: Secondary | ICD-10-CM | POA: Diagnosis not present

## 2020-02-13 DIAGNOSIS — M7051 Other bursitis of knee, right knee: Secondary | ICD-10-CM | POA: Diagnosis not present

## 2020-02-16 DIAGNOSIS — M7051 Other bursitis of knee, right knee: Secondary | ICD-10-CM | POA: Diagnosis not present

## 2020-02-16 DIAGNOSIS — M25561 Pain in right knee: Secondary | ICD-10-CM | POA: Diagnosis not present

## 2020-02-16 DIAGNOSIS — S83411A Sprain of medial collateral ligament of right knee, initial encounter: Secondary | ICD-10-CM | POA: Diagnosis not present

## 2020-02-18 ENCOUNTER — Ambulatory Visit (INDEPENDENT_AMBULATORY_CARE_PROVIDER_SITE_OTHER): Payer: Medicare Other | Admitting: Family Medicine

## 2020-02-18 ENCOUNTER — Other Ambulatory Visit: Payer: Self-pay

## 2020-02-18 ENCOUNTER — Encounter: Payer: Self-pay | Admitting: Family Medicine

## 2020-02-18 VITALS — BP 125/84 | HR 73 | Temp 98.5°F | Resp 18 | Ht 71.0 in | Wt 209.0 lb

## 2020-02-18 DIAGNOSIS — I4891 Unspecified atrial fibrillation: Secondary | ICD-10-CM | POA: Diagnosis not present

## 2020-02-18 DIAGNOSIS — M25562 Pain in left knee: Secondary | ICD-10-CM

## 2020-02-18 DIAGNOSIS — E7800 Pure hypercholesterolemia, unspecified: Secondary | ICD-10-CM | POA: Diagnosis not present

## 2020-02-18 DIAGNOSIS — D689 Coagulation defect, unspecified: Secondary | ICD-10-CM

## 2020-02-18 DIAGNOSIS — I10 Essential (primary) hypertension: Secondary | ICD-10-CM

## 2020-02-18 DIAGNOSIS — M25561 Pain in right knee: Secondary | ICD-10-CM | POA: Diagnosis not present

## 2020-02-18 DIAGNOSIS — Z0000 Encounter for general adult medical examination without abnormal findings: Secondary | ICD-10-CM | POA: Diagnosis not present

## 2020-02-18 DIAGNOSIS — I352 Nonrheumatic aortic (valve) stenosis with insufficiency: Secondary | ICD-10-CM

## 2020-02-18 DIAGNOSIS — M7051 Other bursitis of knee, right knee: Secondary | ICD-10-CM | POA: Diagnosis not present

## 2020-02-18 DIAGNOSIS — S83411A Sprain of medial collateral ligament of right knee, initial encounter: Secondary | ICD-10-CM | POA: Diagnosis not present

## 2020-02-18 DIAGNOSIS — G8929 Other chronic pain: Secondary | ICD-10-CM

## 2020-02-18 NOTE — Patient Instructions (Signed)
.   Please review the attached list of medications and notify my office if there are any errors.   . Please bring all of your medications to every appointment so we can make sure that our medication list is the same as yours.   

## 2020-02-18 NOTE — Progress Notes (Signed)
Complete physical exam   Patient: Grant Schmitt   DOB: February 12, 1929   86 y.o. Male  MRN: [id number] Visit Date: 02/18/2020  Today's healthcare provider: Lelon Huh, MD   Chief Complaint  Patient presents with  . Annual Exam  . Hyperlipidemia  . Hypertension   Subjective    Grant Schmitt is a 86 y.o. male who presents today for a complete physical exam.  He reports consuming a general diet. Gym/ health club routine includes cardio and walking on track . He generally feels fairly well. He reports sleeping fairly well. He does not have additional problems to discuss today.   Had AWV on 11/25/2019 with [facility]  HPI  Lipid/Cholesterol, Follow-up  Last lipid panel Other pertinent labs  Lab Results  Component Value Date   CHOL 130 02/17/2019   HDL 58 02/17/2019   LDLCALC 58 02/17/2019   TRIG 71 02/17/2019   CHOLHDL 2.2 02/17/2019   Lab Results  Component Value Date   ALT 19 02/17/2019   AST 21 02/17/2019   PLT 197 06/25/2018     He was last seen for this 1 year ago.  Management since that visit includes continue same medication.  He reports good compliance with treatment. He is not having side effects.   Symptoms: No chest pain No chest pressure/discomfort  No dyspnea No lower extremity edema  No numbness or tingling of extremity No orthopnea  No palpitations No paroxysmal nocturnal dyspnea  No speech difficulty No syncope   Current diet: well balanced Current exercise: cardiovascular workout on exercise equipment and walking  The ASCVD Risk score (Hart., et al., 2013) failed to calculate for the following reasons:   The 2013 ASCVD risk score is only valid for ages 44 to 62  ---------------------------------------------------------------------------------------------------  Hypertension, follow-up  BP Readings from Last 3 Encounters:  02/18/20 125/84  02/17/19 122/79  12/18/18 106/65   Wt Readings from Last 3 Encounters:  02/18/20 209 lb (94.8  kg)  02/17/19 213 lb (96.6 kg)  12/18/18 213 lb (96.6 kg)     He was last seen for hypertension 1 year ago.  BP at that visit was 122/79. Management since that visit includes advising patient to drink more water due to slight decline in kidney functions.  He reports good compliance with treatment. He is not having side effects.  He is following a Regular diet. He is exercising. He does not smoke.  Use of agents associated with hypertension: none.   Outside blood pressures are checked occasionally. Patient is unsure of the readings. Symptoms: No chest pain No chest pressure  No palpitations No syncope  No dyspnea No orthopnea  No paroxysmal nocturnal dyspnea No lower extremity edema   Pertinent labs: Lab Results  Component Value Date   CHOL 130 02/17/2019   HDL 58 02/17/2019   LDLCALC 58 02/17/2019   TRIG 71 02/17/2019   CHOLHDL 2.2 02/17/2019   Lab Results  Component Value Date   NA 139 02/17/2019   K 4.8 02/17/2019   CREATININE 1.40 (H) 02/17/2019   GFRNONAA 45 (L) 02/17/2019   GFRAA 52 (L) 02/17/2019   GLUCOSE 93 02/17/2019     The ASCVD Risk score (Goff DC Jr., et al., 2013) failed to calculate for the following reasons:   The 2013 ASCVD risk score is only valid for ages 45 to 67   ---------------------------------------------------------------------------------------------------  Follow up for Atrial Fibrillation:  The patient was last seen for this 1 year ago. Changes made  at last visit include none. Continue aggressive risk factor modification and continue routine cardology follow up.  He reports good compliance with treatment. He feels that condition is Unchanged. He is not having side effects.   -----------------------------------------------------------------------------------------  Follow up for Hyperuricemia:  The patient was last seen for this 1 year ago. Changes made at last visit include none.  He reports good compliance with treatment. He  feels that condition is Improved. He is not having side effects.  Has had no gout flares since being on allopurinol.  ----------------------------------------------------------------------------------------- Been having left knee pain a few months. Went to [facility] last week and had cortisone injection. Is scheduled for follow up.    Past Medical History:  Diagnosis Date  . A-fib ([place])   . Allergy   . Arthritis   . Cardiomyopathy, secondary ([place])   . GERD (gastroesophageal reflux disease)   . H/O hemorrhoidectomy 1984  . History of chickenpox   . Hyperlipidemia   . Hypertension   . Pacemaker   . Sick sinus syndrome [facility])    Past Surgical History:  Procedure Laterality Date  . CATARACT EXTRACTION W/ INTRAOCULAR LENS IMPLANT Left   . EYE SURGERY    . hemorrhoidectomy  1984  . IMPLANTABLE CARDIOVERTER DEFIBRILLATOR (ICD) GENERATOR CHANGE Left 06/26/2018   Procedure: SINGLE CHAMBER BATTERY CHANGEOUT;  Surgeon: Isaias Cowman, MD;  Location: [facility] ORS;  Service: Cardiovascular;  Laterality: Left;  . INSERT / REPLACE / REMOVE PACEMAKER  2012  . MOHS SURGERY Right 05/14/2018   ear. Dr. Karin Golden [facility]  . REPLACEMENT TOTAL KNEE Right 2005   Social History   Socioeconomic History  . Marital status: Widowed    Spouse name: Not on file  . Number of children: 2  . Years of education: 13  . Highest education level: 12th grade  Occupational History  . Occupation: Retired  Tobacco Use  . Smoking status: Former Smoker    Types: Cigarettes    Quit date: 06/19/1958    Years since quitting: 61.7  . Smokeless tobacco: Never Used  Vaping Use  . Vaping Use: Never used  Substance and Sexual Activity  . Alcohol use: Yes    Alcohol/week: 0.0 - 1.0 standard drinks  . Drug use: No  . Sexual activity: Not on file  Other Topics Concern  . Not on file  Social History Narrative   Lives at [place] Strain: Low  Risk   . Difficulty of Paying Living Expenses: Not hard at all  Food Insecurity: No Food Insecurity  . Worried About Charity fundraiser in the Last Year: Never true  . Ran Out of Food in the Last Year: Never true  Transportation Needs: No Transportation Needs  . Lack of Transportation (Medical): No  . Lack of Transportation (Non-Medical): No  Physical Activity: Inactive  . Days of Exercise per Week: 0 days  . Minutes of Exercise per Session: 0 min  Stress: No Stress Concern Present  . Feeling of Stress : Only a little  Social Connections: Socially Isolated  . Frequency of Communication with Friends and Family: More than three times a week  . Frequency of Social Gatherings with Friends and Family: Once a week  . Attends Religious Services: Never  . Active Member of Clubs or Organizations: No  . Attends Archivist Meetings: Never  . Marital Status: Widowed  Intimate Partner Violence: Not At Risk  . Fear  of Current or Ex-Partner: No  . Emotionally Abused: No  . Physically Abused: No  . Sexually Abused: No   Family Status  Relation Name Status  . Mother  Deceased at age 81  . Father  Deceased at age 48  . Sister  Alive  . Brother  Alive  . Brother  Alive  . Brother  Alive  . Brother  Alive  . Brother  Alive   No family history on file. No Known Allergies  Patient Care Team: Birdie Sons, MD as PCP - General (Family Medicine) Ubaldo Glassing Javier Docker, MD as Consulting Physician (Cardiology) Dingeldein, Remo Lipps, MD as Consulting Physician (Ophthalmology) Chrismon, Vickki Muff, [place] as Consulting Physician (Family Medicine) Will Bonnet, MD as Referring Physician (Internal Medicine)   Medications: Outpatient Medications Prior to Visit  Medication Sig  . allopurinol (ZYLOPRIM) 300 MG tablet TAKE 1 TABLET BY MOUTH  DAILY  . Ascorbic Acid (VITAMIN C) 1000 MG tablet Take 1,000 mg by mouth daily.  . Cholecalciferol (VITAMIN D3) 2000 units TABS Take 2,000 Units by mouth  daily.   Marland Kitchen lisinopril (ZESTRIL) 10 MG tablet TAKE 1 TABLET BY MOUTH  DAILY  . losartan (COZAAR) 50 MG tablet Take 50 mg by mouth daily.   . metoprolol tartrate (LOPRESSOR) 50 MG tablet TAKE 1 TABLET BY MOUTH  DAILY  . simvastatin (ZOCOR) 20 MG tablet TAKE 1 TABLET BY MOUTH  DAILY  . tamsulosin (FLOMAX) 0.4 MG CAPS capsule TAKE 1 CAPSULE BY MOUTH  DAILY  . triamcinolone cream (KENALOG) 0.1 % Apply 1 application topically 2 (two) times daily.  Alveda Reasons 20 MG TABS tablet Take 1 tablet (20 mg total) by mouth daily.   No facility-administered medications prior to visit.    Review of Systems  Constitutional: Negative for appetite change, chills, fatigue and fever.  HENT: Positive for rhinorrhea. Negative for congestion, ear pain, hearing loss, nosebleeds and trouble swallowing.   Eyes: Negative for pain and visual disturbance.  Respiratory: Negative for cough, chest tightness and shortness of breath.   Cardiovascular: Negative for chest pain, palpitations and leg swelling.  Gastrointestinal: Negative for abdominal pain, blood in stool, constipation, diarrhea, nausea and vomiting.  Endocrine: Negative for polydipsia, polyphagia and polyuria.  Genitourinary: Positive for frequency. Negative for dysuria and flank pain.  Musculoskeletal: Positive for arthralgias (left knee pain) and joint swelling. Negative for back pain, myalgias and neck stiffness.  Skin: Negative for color change, rash and wound.  Neurological: Negative for dizziness, tremors, seizures, speech difficulty, weakness, light-headedness and headaches.  Psychiatric/Behavioral: Negative for behavioral problems, confusion, decreased concentration, dysphoric mood and sleep disturbance. The patient is not nervous/anxious.   All other systems reviewed and are negative.    Objective    BP 125/84 (BP Location: Left Arm, Patient Position: Sitting, Cuff Size: Normal)   Pulse 73   Temp 98.5 F (36.9 C) (Oral)   Resp 18   Ht 5\' 11"   (1.803 m)   Wt 209 lb (94.8 kg)   BMI 29.15 kg/m   Physical Exam   General Appearance:     Overweight male. Alert, cooperative, in no acute distress, appears stated age  Head:    Normocephalic, without obvious abnormality, atraumatic  Eyes:    PERRL, conjunctiva/corneas clear, EOM's intact, fundi    benign, both eyes       Ears:    Normal TM's and external ear canals, both ears  Nose:   Nares normal, septum midline, mucosa normal, no drainage  or sinus tenderness  Throat:   Lips, mucosa, and tongue normal; teeth and gums normal  Neck:   Supple, symmetrical, trachea midline, no adenopathy;       thyroid:  No enlargement/tenderness/nodules; no carotid   bruit or JVD  Back:     Symmetric, no curvature, ROM normal, no CVA tenderness  Lungs:     Clear to auscultation bilaterally, respirations unlabored  Chest wall:    No tenderness or deformity  Heart:    Normal heart rate. Regularly irregular rhythm.  2/6 harsh, crescendo-decrescendo, systolic murmur at right upper sternal border 1/6 blowing, holosystolic murmur at apex  S1 and S2 normal  Abdomen:     Soft, non-tender, bowel sounds active all four quadrants,    no masses, no organomegaly  Genitalia:    deferred  Rectal:    deferred  Extremities:   All extremities are intact. No cyanosis or edema  Pulses:   2+ and symmetric all extremities  Skin:   Skin color, texture, turgor normal, no rashes or lesions  Lymph nodes:   Cervical, supraclavicular, and axillary nodes normal  Neurologic:   CNII-XII intact. Normal strength, sensation and reflexes      throughout     Last depression screening scores PHQ 2/9 Scores 11/25/2019 02/17/2019 11/20/2018  PHQ - 2 Score 0 0 0  PHQ- 9 Score - 2 0   Last fall risk screening Fall Risk  11/25/2019  Falls in the past year? 0  Number falls in past yr: 0  Injury with Fall? 0  Follow up -   Last Audit-C alcohol use screening Alcohol Use Disorder Test (AUDIT) 11/25/2019  1. How often do you have a  drink containing alcohol? 1  2. How many drinks containing alcohol do you have on a typical day when you are drinking? 0  3. How often do you have six or more drinks on one occasion? 0  AUDIT-C Score 1  Alcohol Brief Interventions/Follow-up AUDIT Score <7 follow-up not indicated   A score of 3 or more in women, and 4 or more in men indicates increased risk for alcohol abuse, EXCEPT if all of the points are from question 1   No results found for any visits on 02/18/20.  Assessment & Plan    Routine Health Maintenance and Physical Exam  Exercise Activities and Dietary recommendations Goals    . Exercise 3x per week (30 min per time)     Recommend to exercise for 3 days a week for at least 30 minutes at a time.        Immunization History  Administered Date(s) Administered  . Fluad Quad(high Dose 65+) 02/17/2019  . Influenza, High Dose Seasonal PF 03/11/2018  . Influenza-Unspecified 02/05/2012, 03/22/2014, 03/15/2015, 03/31/2016  . PFIZER SARS-COV-2 Vaccination 07/12/2019, 08/02/2019  . Pneumococcal Conjugate-13 06/23/2014  . Zoster Recombinat (Shingrix) 12/24/2018, 02/25/2019    Health Maintenance  Topic Date Due  . PNA vac Low Risk Adult (2 of 2 - PPSV23) 06/24/2015  . INFLUENZA VACCINE  01/18/2020  . TETANUS/TDAP  08/18/2026 (Originally 04/16/1948)  . COVID-19 Vaccine  Completed    Discussed health benefits of physical activity, and encouraged him to engage in regular exercise appropriate for his age and condition.  1. Annual physical exam Generally doing well. He will be getting flu vaccine at [facility] or pharmacy  2. Hypercholesterolemia He is tolerating simvastatin well with no adverse effects.   - CBC - Comprehensive metabolic panel - Lipid panel  3. Hypertension, unspecified  type Well controlled.  Continue current medications.   - Comprehensive metabolic panel  4. Atrial fibrillation, unspecified type ([place]) Rate well controlled, paced. Continue routine follow up  Dr. Ubaldo Glassing. Continue NOAC  5. Acquired coagulation disorder ([place]) On NOAC for a-fib  6. Nonrheumatic aortic insufficiency with aortic stenosis Asymptomatic. Compliant with medication.  Continue aggressive risk factor modification.    7. Chronic pain of left knee Followed by [facility]        The entirety of the information documented in the History of Present Illness, Review of Systems and Physical Exam were personally obtained by me. Portions of this information were initially documented by the CMA and reviewed by me for thoroughness and accuracy.      Lelon Huh, MD  [facility] [phone] (phone) [phone] (fax)  [place]

## 2020-02-19 LAB — CBC
Hematocrit: 38.7 % (ref 37.5–51.0)
Hemoglobin: 12.6 g/dL — ABNORMAL LOW (ref 13.0–17.7)
MCH: 32.8 pg (ref 26.6–33.0)
MCHC: 32.6 g/dL (ref 31.5–35.7)
MCV: 101 fL — ABNORMAL HIGH (ref 79–97)
Platelets: 215 10*3/uL (ref 150–450)
RBC: 3.84 x10E6/uL — ABNORMAL LOW (ref 4.14–5.80)
RDW: 13.4 % (ref 11.6–15.4)
WBC: 12.1 10*3/uL — ABNORMAL HIGH (ref 3.4–10.8)

## 2020-02-19 LAB — COMPREHENSIVE METABOLIC PANEL
ALT: 24 IU/L (ref 0–44)
AST: 20 IU/L (ref 0–40)
Albumin/Globulin Ratio: 1.5 (ref 1.2–2.2)
Albumin: 4.1 g/dL (ref 3.6–4.6)
Alkaline Phosphatase: 79 IU/L (ref 48–121)
BUN/Creatinine Ratio: 33 — ABNORMAL HIGH (ref 10–24)
BUN: 40 mg/dL — ABNORMAL HIGH (ref 8–27)
Bilirubin Total: 0.6 mg/dL (ref 0.0–1.2)
CO2: 23 mmol/L (ref 20–29)
Calcium: 9.7 mg/dL (ref 8.6–10.2)
Chloride: 102 mmol/L (ref 96–106)
Creatinine, Ser: 1.21 mg/dL (ref 0.76–1.27)
GFR calc Af Amer: 61 mL/min/{1.73_m2} (ref >59)
GFR calc non Af Amer: 53 mL/min/{1.73_m2} — ABNORMAL LOW (ref >59)
Globulin, Total: 2.8 g/dL (ref 1.5–4.5)
Glucose: 92 mg/dL (ref 65–99)
Potassium: 4.8 mmol/L (ref 3.5–5.2)
Sodium: 137 mmol/L (ref 134–144)
Total Protein: 6.9 g/dL (ref 6.0–8.5)

## 2020-02-19 LAB — LIPID PANEL
Chol/HDL Ratio: 2.3 ratio (ref 0.0–5.0)
Cholesterol, Total: 130 mg/dL (ref 100–199)
HDL: 57 mg/dL (ref >39)
LDL Chol Calc (NIH): 56 mg/dL (ref 0–99)
Triglycerides: 86 mg/dL (ref 0–149)
VLDL Cholesterol Cal: 17 mg/dL (ref 5–40)

## 2020-02-23 DIAGNOSIS — S83411A Sprain of medial collateral ligament of right knee, initial encounter: Secondary | ICD-10-CM | POA: Diagnosis not present

## 2020-02-23 DIAGNOSIS — M7051 Other bursitis of knee, right knee: Secondary | ICD-10-CM | POA: Diagnosis not present

## 2020-02-23 DIAGNOSIS — M25561 Pain in right knee: Secondary | ICD-10-CM | POA: Diagnosis not present

## 2020-02-25 DIAGNOSIS — M1712 Unilateral primary osteoarthritis, left knee: Secondary | ICD-10-CM | POA: Diagnosis not present

## 2020-02-27 DIAGNOSIS — M25561 Pain in right knee: Secondary | ICD-10-CM | POA: Diagnosis not present

## 2020-02-27 DIAGNOSIS — M7051 Other bursitis of knee, right knee: Secondary | ICD-10-CM | POA: Diagnosis not present

## 2020-02-27 DIAGNOSIS — S83411A Sprain of medial collateral ligament of right knee, initial encounter: Secondary | ICD-10-CM | POA: Diagnosis not present

## 2020-03-02 DIAGNOSIS — M7051 Other bursitis of knee, right knee: Secondary | ICD-10-CM | POA: Diagnosis not present

## 2020-03-02 DIAGNOSIS — S83411A Sprain of medial collateral ligament of right knee, initial encounter: Secondary | ICD-10-CM | POA: Diagnosis not present

## 2020-03-02 DIAGNOSIS — M25561 Pain in right knee: Secondary | ICD-10-CM | POA: Diagnosis not present

## 2020-03-05 DIAGNOSIS — S83411A Sprain of medial collateral ligament of right knee, initial encounter: Secondary | ICD-10-CM | POA: Diagnosis not present

## 2020-03-05 DIAGNOSIS — M7051 Other bursitis of knee, right knee: Secondary | ICD-10-CM | POA: Diagnosis not present

## 2020-03-05 DIAGNOSIS — M25561 Pain in right knee: Secondary | ICD-10-CM | POA: Diagnosis not present

## 2020-03-11 ENCOUNTER — Telehealth: Payer: Self-pay

## 2020-03-11 NOTE — Telephone Encounter (Signed)
Copied from [place] #[id number]. Topic: General - Inquiry >> Mar 11, 2020 10:12 AM Grant Schmitt wrote: Reason for CRM: Pt called asking Dr. Maralyn Sago nurse to send a copy of his last lab results through the mail. >> Mar 11, 2020 10:13 AM Grant Schmitt wrote: He also wanted to add that he got  His flu vaccine yesterday at the [place].

## 2020-03-15 NOTE — Telephone Encounter (Signed)
Lab results have been mailed to patients home. KW

## 2020-03-16 ENCOUNTER — Other Ambulatory Visit: Payer: Self-pay | Admitting: Family Medicine

## 2020-03-16 DIAGNOSIS — I4891 Unspecified atrial fibrillation: Secondary | ICD-10-CM

## 2020-03-16 DIAGNOSIS — N400 Enlarged prostate without lower urinary tract symptoms: Secondary | ICD-10-CM

## 2020-03-16 DIAGNOSIS — I10 Essential (primary) hypertension: Secondary | ICD-10-CM

## 2020-03-16 DIAGNOSIS — E7800 Pure hypercholesterolemia, unspecified: Secondary | ICD-10-CM

## 2020-03-17 NOTE — Telephone Encounter (Signed)
Requested Prescriptions  Pending Prescriptions Disp Refills  . simvastatin (ZOCOR) 20 MG tablet [Pharmacy Med Name: Simvastatin 20 MG Oral Tablet] 90 tablet 3    Sig: TAKE 1 TABLET BY MOUTH  DAILY     Cardiovascular:  Antilipid - Statins Failed - 03/16/2020 11:02 PM      Failed - LDL in normal range and within 360 days    LDL Chol Calc (NIH)  Date Value Ref Range Status  02/18/2020 56 0 - 99 mg/dL Final         Passed - Total Cholesterol in normal range and within 360 days    Cholesterol, Total  Date Value Ref Range Status  02/18/2020 130 100 - 199 mg/dL Final         Passed - HDL in normal range and within 360 days    HDL  Date Value Ref Range Status  02/18/2020 57 >39 mg/dL Final         Passed - Triglycerides in normal range and within 360 days    Triglycerides  Date Value Ref Range Status  02/18/2020 86 0 - 149 mg/dL Final         Passed - Patient is not pregnant      Passed - Valid encounter within last 12 months    Recent Outpatient Visits          4 weeks ago Annual physical exam   [facility] Birdie Sons, MD   1 year ago Need for zoster vaccination   [facility] Birdie Sons, MD   1 year ago Annual physical exam   [facility] Birdie Sons, [facility]   1 year ago Contact dermatitis, unspecified contact dermatitis type, unspecified trigger   [facility] Trinna Post, [place]   2 years ago Annual physical exam   [facility] Birdie Sons, MD             . tamsulosin (FLOMAX) 0.4 MG CAPS capsule [Pharmacy Med Name: Tamsulosin HCl 0.4 MG Oral Capsule] 90 capsule 3    Sig: TAKE 1 CAPSULE BY MOUTH  DAILY     Urology: Alpha-Adrenergic Blocker Passed - 03/16/2020 11:02 PM      Passed - Last BP in normal range    BP Readings from Last 1 Encounters:  02/18/20 125/84         Passed - Valid encounter within last 12 months    Recent Outpatient Visits          4 weeks ago  Annual physical exam   [facility] Birdie Sons, MD   1 year ago Need for zoster vaccination   [facility] Birdie Sons, MD   1 year ago Annual physical exam   [facility] Birdie Sons, MD   1 year ago Contact dermatitis, unspecified contact dermatitis type, unspecified trigger   [facility] Trinna Post, [place]   2 years ago Annual physical exam   [facility] Birdie Sons, MD             . metoprolol tartrate (LOPRESSOR) 50 MG tablet [Pharmacy Med Name: Metoprolol Tartrate 50 MG Oral Tablet] 90 tablet 3    Sig: TAKE 1 TABLET BY MOUTH  DAILY     Cardiovascular:  Beta Blockers Passed - 03/16/2020 11:02 PM      Passed - Last BP in normal range    BP Readings from Last 1 Encounters:  02/18/20 125/84         Passed - Last Heart Rate in normal range    Pulse Readings from Last 1 Encounters:  02/18/20 73         Passed - Valid encounter within last 6 months    Recent Outpatient Visits          4 weeks ago Annual physical exam   [facility] Birdie Sons, MD   1 year ago Need for zoster vaccination   [facility] Birdie Sons, MD   1 year ago Annual physical exam   [facility] Birdie Sons, MD   1 year ago Contact dermatitis, unspecified contact dermatitis type, unspecified trigger   [facility] Trinna Post, [place]   2 years ago Annual physical exam   [facility] Birdie Sons, MD

## 2020-04-01 DIAGNOSIS — K036 Deposits [accretions] on teeth: Secondary | ICD-10-CM | POA: Diagnosis not present

## 2020-05-04 DIAGNOSIS — I495 Sick sinus syndrome: Secondary | ICD-10-CM | POA: Diagnosis not present

## 2020-05-27 ENCOUNTER — Other Ambulatory Visit: Payer: Self-pay | Admitting: Family Medicine

## 2020-05-27 DIAGNOSIS — Z8739 Personal history of other diseases of the musculoskeletal system and connective tissue: Secondary | ICD-10-CM

## 2020-06-10 DIAGNOSIS — S93401A Sprain of unspecified ligament of right ankle, initial encounter: Secondary | ICD-10-CM | POA: Diagnosis not present

## 2020-06-10 DIAGNOSIS — M25571 Pain in right ankle and joints of right foot: Secondary | ICD-10-CM | POA: Diagnosis not present

## 2020-06-21 DIAGNOSIS — S93401D Sprain of unspecified ligament of right ankle, subsequent encounter: Secondary | ICD-10-CM | POA: Diagnosis not present

## 2020-06-30 DIAGNOSIS — M25562 Pain in left knee: Secondary | ICD-10-CM | POA: Diagnosis not present

## 2020-08-31 ENCOUNTER — Other Ambulatory Visit: Payer: Self-pay | Admitting: Family Medicine

## 2020-08-31 DIAGNOSIS — I4891 Unspecified atrial fibrillation: Secondary | ICD-10-CM

## 2020-08-31 DIAGNOSIS — I10 Essential (primary) hypertension: Secondary | ICD-10-CM

## 2020-08-31 NOTE — Telephone Encounter (Signed)
Requested Prescriptions  Pending Prescriptions Disp Refills  . metoprolol tartrate (LOPRESSOR) 50 MG tablet [Pharmacy Med Name: Metoprolol Tartrate 50 MG Oral Tablet] 90 tablet 0    Sig: TAKE 1 TABLET BY MOUTH  DAILY     Cardiovascular:  Beta Blockers Failed - 08/31/2020 11:04 PM      Failed - Valid encounter within last 6 months    Recent Outpatient Visits          6 months ago Annual physical exam   [facility] Birdie Sons, MD   1 year ago Need for zoster vaccination   [facility] Birdie Sons, MD   1 year ago Annual physical exam   [facility] Birdie Sons, MD   1 year ago Contact dermatitis, unspecified contact dermatitis type, unspecified trigger   [facility] Trinna Post, [place]   2 years ago Annual physical exam   [facility] Birdie Sons, MD             Passed - Last BP in normal range    BP Readings from Last 1 Encounters:  02/18/20 125/84         Passed - Last Heart Rate in normal range    Pulse Readings from Last 1 Encounters:  02/18/20 73

## 2020-09-07 DIAGNOSIS — I495 Sick sinus syndrome: Secondary | ICD-10-CM | POA: Diagnosis not present

## 2020-10-08 DIAGNOSIS — M1712 Unilateral primary osteoarthritis, left knee: Secondary | ICD-10-CM | POA: Diagnosis not present

## 2020-10-19 DIAGNOSIS — M1712 Unilateral primary osteoarthritis, left knee: Secondary | ICD-10-CM | POA: Diagnosis not present

## 2020-11-01 DIAGNOSIS — H2511 Age-related nuclear cataract, right eye: Secondary | ICD-10-CM | POA: Diagnosis not present

## 2020-11-09 ENCOUNTER — Encounter: Payer: Self-pay | Admitting: Family Medicine

## 2020-11-09 ENCOUNTER — Ambulatory Visit (INDEPENDENT_AMBULATORY_CARE_PROVIDER_SITE_OTHER): Payer: Medicare Other | Admitting: Family Medicine

## 2020-11-09 ENCOUNTER — Other Ambulatory Visit: Payer: Self-pay

## 2020-11-09 VITALS — BP 100/60 | HR 84 | Resp 16 | Wt 211.6 lb

## 2020-11-09 DIAGNOSIS — L259 Unspecified contact dermatitis, unspecified cause: Secondary | ICD-10-CM | POA: Diagnosis not present

## 2020-11-09 MED ORDER — CLOTRIMAZOLE-BETAMETHASONE 1-0.05 % EX LOTN: TOPICAL_LOTION | Freq: Two times a day (BID) | CUTANEOUS | 0 refills | Status: DC

## 2020-11-09 NOTE — Progress Notes (Signed)
      Established patient visit   Patient: Grant Schmitt   DOB: 30-Dec-1928   88 y.o. Male  MRN: [id number] Visit Date: 11/09/2020  Today's healthcare provider: Lelon Huh, MD   No chief complaint on file.  Subjective    Rash This is a new problem. The current episode started in the past 7 days. The affected locations include the left arm. The rash is characterized by itchiness, redness and burning. He was exposed to nothing. Pertinent negatives include no anorexia, congestion, cough, diarrhea, eye pain, facial edema, fatigue, fever, joint pain, nail changes, rhinorrhea, shortness of breath, sore throat or vomiting. Treatments tried: cortisone cream. The treatment provided no relief.  Covid vaccine booster 2 weeks ago. He's been using Dove soap and OTC HC cream without relief.   No Known Allergies     Medications: Outpatient Medications Prior to Visit  Medication Sig  . allopurinol (ZYLOPRIM) 300 MG tablet TAKE 1 TABLET BY MOUTH  DAILY  . Ascorbic Acid (VITAMIN C) 1000 MG tablet Take 1,000 mg by mouth daily.  . Cholecalciferol (VITAMIN D3) 2000 units TABS Take 2,000 Units by mouth daily.   Marland Kitchen lisinopril (ZESTRIL) 10 MG tablet TAKE 1 TABLET BY MOUTH  DAILY  . losartan (COZAAR) 50 MG tablet Take 50 mg by mouth daily.   . metoprolol tartrate (LOPRESSOR) 50 MG tablet TAKE 1 TABLET BY MOUTH  DAILY  . simvastatin (ZOCOR) 20 MG tablet TAKE 1 TABLET BY MOUTH  DAILY  . tamsulosin (FLOMAX) 0.4 MG CAPS capsule TAKE 1 CAPSULE BY MOUTH  DAILY  . triamcinolone cream (KENALOG) 0.1 % Apply 1 application topically 2 (two) times daily.  Alveda Reasons 20 MG TABS tablet Take 1 tablet (20 mg total) by mouth daily.   No facility-administered medications prior to visit.    Review of Systems  Constitutional: Negative for fatigue and fever.  HENT: Negative for congestion, rhinorrhea and sore throat.   Eyes: Negative for pain.  Respiratory: Negative for cough and shortness of breath.    Gastrointestinal: Negative for anorexia, diarrhea and vomiting.  Musculoskeletal: Negative for joint pain.  Skin: Positive for rash. Negative for nail changes.       Objective    BP 100/60   Pulse 84   Resp 16   Wt 211 lb 9.6 oz (96 kg)   SpO2 98%   BMI 29.51 kg/m     Physical Exam  Volar left arm. .      Assessment & Plan     1. Contact dermatitis, unspecified contact dermatitis type, unspecified trigger  - clotrimazole-betamethasone (LOTRISONE) lotion; Apply topically 2 (two) times daily.  Dispense: 30 mL; Refill: 0   Avoid soaps and all drying agents. Call if not much better next week.       The entirety of the information documented in the History of Present Illness, Review of Systems and Physical Exam were personally obtained by me. Portions of this information were initially documented by the CMA and reviewed by me for thoroughness and accuracy.      Lelon Huh, MD  [facility] [phone] (phone) [phone] (fax)  [place]

## 2020-11-10 ENCOUNTER — Other Ambulatory Visit: Payer: Self-pay | Admitting: Family Medicine

## 2020-11-10 DIAGNOSIS — I4891 Unspecified atrial fibrillation: Secondary | ICD-10-CM

## 2020-11-10 DIAGNOSIS — I10 Essential (primary) hypertension: Secondary | ICD-10-CM

## 2020-11-10 NOTE — Telephone Encounter (Signed)
Requested Prescriptions  Pending Prescriptions Disp Refills  . metoprolol tartrate (LOPRESSOR) 50 MG tablet [Pharmacy Med Name: Metoprolol Tartrate 50 MG Oral Tablet] 90 tablet 1    Sig: TAKE 1 TABLET BY MOUTH  DAILY     Cardiovascular:  Beta Blockers Passed - 11/10/2020 10:58 PM      Passed - Last BP in normal range    BP Readings from Last 1 Encounters:  11/09/20 100/60         Passed - Last Heart Rate in normal range    Pulse Readings from Last 1 Encounters:  11/09/20 84         Passed - Valid encounter within last 6 months    Recent Outpatient Visits          Yesterday Contact dermatitis, unspecified contact dermatitis type, unspecified trigger   [facility] Birdie Sons, MD   8 months ago Annual physical exam   [facility] Birdie Sons, MD   1 year ago Need for zoster vaccination   [facility] Birdie Sons, MD   1 year ago Annual physical exam   [facility] Birdie Sons, MD   1 year ago Contact dermatitis, unspecified contact dermatitis type, unspecified trigger   [facility] [place], Wendee Bea[place], [place]

## 2020-11-15 ENCOUNTER — Other Ambulatory Visit: Payer: Self-pay | Admitting: Family Medicine

## 2020-11-18 ENCOUNTER — Encounter: Payer: Self-pay | Admitting: Orthopedic Surgery

## 2020-11-18 ENCOUNTER — Other Ambulatory Visit: Payer: Self-pay | Admitting: Orthopedic Surgery

## 2020-11-18 NOTE — H&P (Signed)
Grant Schmitt MRN:  [id number] DOB/SEX:  02-Jun-1929/male  CHIEF COMPLAINT:  Painful left Knee  HISTORY: Patient is a 87 y.o. male presented with a history of pain in the left knee. Onset of symptoms was gradual starting several years ago with gradually worsening course since that time. Prior procedures on the knee include n. Patient has been treated conservatively with over-the-counter NSAIDs and activity modification. Patient currently rates pain in the knee at 9 out of 10 with activity. There is pain at night.  PAST MEDICAL HISTORY: Patient Active Problem List   Diagnosis Date Noted  . Chronic pain of left knee 02/18/2020  . Acquired coagulation disorder ([place]) 12/05/2018  . Squamous cell carcinoma, ear, right 09/10/2018  . Mitral insufficiency 06/07/2018  . Aortic insufficiency with aortic stenosis 06/07/2018  . Tricuspid regurgitation 06/07/2018  . History of gout 04/26/2016  . BPH (benign prostatic hyperplasia) 04/26/2016  . Arthritis 04/25/2016  . Hypertension 04/25/2016  . Atrial fibrillation ([place]) 04/25/2016  . GERD (gastroesophageal reflux disease) 04/25/2016  . Hypercholesterolemia 04/25/2016  . Cardiac pacemaker 03/20/2016  . Symptomatic bradycardia 09/30/2015   Past Medical History:  Diagnosis Date  . A-fib ([place])   . Allergy   . Arthritis   . Cardiomyopathy, secondary ([place])   . GERD (gastroesophageal reflux disease)   . H/O hemorrhoidectomy 1984  . History of chickenpox   . Hyperlipidemia   . Hypertension   . Pacemaker   . Sick sinus syndrome [facility])    Past Surgical History:  Procedure Laterality Date  . CATARACT EXTRACTION W/ INTRAOCULAR LENS IMPLANT Left   . EYE SURGERY    . hemorrhoidectomy  1984  . IMPLANTABLE CARDIOVERTER DEFIBRILLATOR (ICD) GENERATOR CHANGE Left 06/26/2018   Procedure: SINGLE CHAMBER BATTERY CHANGEOUT;  Surgeon: Isaias Cowman, MD;  Location: [facility] ORS;  Service: Cardiovascular;  Laterality: Left;  . INSERT / REPLACE / REMOVE  PACEMAKER  2012  . MOHS SURGERY Right 05/14/2018   ear. Dr. Karin Golden [facility]  . REPLACEMENT TOTAL KNEE Right 2005     MEDICATIONS:  (Not in a hospital admission)   ALLERGIES:  No Known Allergies  REVIEW OF SYSTEMS:  Pertinent items are noted in HPI.   FAMILY HISTORY:  No family history on file.  SOCIAL HISTORY:   Social History   Tobacco Use  . Smoking status: Former Smoker    Types: Cigarettes    Quit date: 06/19/1958    Years since quitting: 62.4  . Smokeless tobacco: Never Used  Substance Use Topics  . Alcohol use: Yes    Alcohol/week: 0.0 - 1.0 standard drinks     EXAMINATION:  Vital signs in last 24 hours: @VSRANGES @  There were no vitals taken for this visit.  General Appearance:    Alert, cooperative, no distress, appears stated age  Head:    Normocephalic, without obvious abnormality, atraumatic  Eyes:    PERRL, conjunctiva/corneas clear, EOM's intact, fundi    benign, both eyes       Ears:    Normal TM's and external ear canals, both ears  Nose:   Nares normal, septum midline, mucosa normal, no drainage    or sinus tenderness  Throat:   Lips, mucosa, and tongue normal; teeth and gums normal  Neck:   Supple, symmetrical, trachea midline, no adenopathy;       thyroid:  No enlargement/tenderness/nodules; no carotid   bruit or JVD  Back:     Symmetric, no curvature, ROM normal, no CVA tenderness  Lungs:  Clear to auscultation bilaterally, respirations unlabored  Chest wall:    No tenderness or deformity  Heart:    Regular rate and rhythm, S1 and S2 normal, no murmur, rub   or gallop  Abdomen:     Soft, non-tender, bowel sounds active all four quadrants,    no masses, no organomegaly  Genitalia:    Normal male without lesion, discharge or tenderness  Rectal:    Normal tone, normal prostate, no masses or tenderness;   guaiac negative stool  Extremities:   Extremities normal, atraumatic, no cyanosis or edema  Pulses:   2+ and symmetric all  extremities  Skin:   Skin color, texture, turgor normal, no rashes or lesions  Lymph nodes:   Cervical, supraclavicular, and axillary nodes normal  Neurologic:   CNII-XII intact. Normal strength, sensation and reflexes      throughout    Musculoskeletal:  ROM 0-110, Ligaments intact,  Imaging Review Plain radiographs demonstrate severe degenerative joint disease of the left knee. The overall alignment is significant varus. The bone quality appears to be good for age and reported activity level.  Assessment/Plan: Primary osteoarthritis, left knee   The patient history, physical examination and imaging studies are consistent with advanced degenerative joint disease of the left knee. The patient has failed conservative treatment.  The clearance notes were reviewed.  After discussion with the patient it was felt that Total Knee Replacement was indicated. The procedure,  risks, and benefits of total knee arthroplasty were presented and reviewed. The risks including but not limited to aseptic loosening, infection, blood clots, vascular injury, stiffness, patella tracking problems complications among others were discussed. The patient acknowledged the explanation, agreed to proceed with the plan.  [name] 11/18/2020, 10:23 AM

## 2020-11-19 ENCOUNTER — Other Ambulatory Visit
Admission: RE | Admit: 2020-11-19 | Discharge: 2020-11-19 | Disposition: A | Discharge status: Home / Self Care | Payer: Medicare Other | Source: Ambulatory Visit | Attending: Orthopedic Surgery | Admitting: Orthopedic Surgery

## 2020-11-19 ENCOUNTER — Other Ambulatory Visit: Payer: Self-pay

## 2020-11-19 DIAGNOSIS — Z01810 Encounter for preprocedural cardiovascular examination: Secondary | ICD-10-CM | POA: Diagnosis not present

## 2020-11-19 DIAGNOSIS — Z01818 Encounter for other preprocedural examination: Secondary | ICD-10-CM | POA: Diagnosis not present

## 2020-11-19 HISTORY — DX: Nonrheumatic aortic (valve) stenosis with insufficiency: I35.2

## 2020-11-19 HISTORY — DX: Sleep apnea, unspecified: G47.30

## 2020-11-19 HISTORY — DX: Gout, unspecified: M10.9

## 2020-11-19 HISTORY — DX: Malignant (primary) neoplasm, unspecified: C80.1

## 2020-11-19 HISTORY — DX: Rheumatic tricuspid insufficiency: I07.1

## 2020-11-19 LAB — URINALYSIS, ROUTINE W REFLEX MICROSCOPIC
Bilirubin Urine: NEGATIVE
Glucose, UA: NEGATIVE mg/dL
Hgb urine dipstick: NEGATIVE
Ketones, ur: NEGATIVE mg/dL
Nitrite: NEGATIVE
Protein, ur: NEGATIVE mg/dL
Specific Gravity, Urine: 1.019 (ref 1.005–1.030)
Squamous Epithelial / HPF: NONE SEEN (ref 0–5)
WBC, UA: >50 WBC/hpf — ABNORMAL HIGH (ref 0–5)
pH: 6 (ref 5.0–8.0)

## 2020-11-19 LAB — SURGICAL PCR SCREEN
MRSA, PCR: NEGATIVE
Staphylococcus aureus: NEGATIVE

## 2020-11-19 LAB — PROTIME-INR
INR: 2.1 — ABNORMAL HIGH (ref 0.8–1.2)
Prothrombin Time: 23.6 seconds — ABNORMAL HIGH (ref 11.4–15.2)

## 2020-11-19 LAB — BASIC METABOLIC PANEL
Anion gap: 6 (ref 5–15)
BUN: 41 mg/dL — ABNORMAL HIGH (ref 8–23)
CO2: 28 mmol/L (ref 22–32)
Calcium: 10.2 mg/dL (ref 8.9–10.3)
Chloride: 103 mmol/L (ref 98–111)
Creatinine, Ser: 1.22 mg/dL (ref 0.61–1.24)
GFR, Estimated: 57 mL/min — ABNORMAL LOW (ref >60)
Glucose, Bld: 108 mg/dL — ABNORMAL HIGH (ref 70–99)
Potassium: 4.2 mmol/L (ref 3.5–5.1)
Sodium: 137 mmol/L (ref 135–145)

## 2020-11-19 LAB — CBC
HCT: 39.2 % (ref 39.0–52.0)
Hemoglobin: 13.1 g/dL (ref 13.0–17.0)
MCH: 32.7 pg (ref 26.0–34.0)
MCHC: 33.4 g/dL (ref 30.0–36.0)
MCV: 97.8 fL (ref 80.0–100.0)
Platelets: 238 10*3/uL (ref 150–400)
RBC: 4.01 MIL/uL — ABNORMAL LOW (ref 4.22–5.81)
RDW: 13.2 % (ref 11.5–15.5)
WBC: 9.5 10*3/uL (ref 4.0–10.5)
nRBC: 0 % (ref 0.0–0.2)

## 2020-11-19 LAB — APTT: aPTT: 44 seconds — ABNORMAL HIGH (ref 24–36)

## 2020-11-19 LAB — TYPE AND SCREEN
ABO/RH(D): O POS
Antibody Screen: NEGATIVE

## 2020-11-19 NOTE — Patient Instructions (Addendum)
Your procedure is scheduled on:11-29-20 MONDAY Report to the Registration Desk on the 1st floor of the [facility]-Then proceed to the 2nd floor Surgery Desk in the [facility] To find out your arrival time, please call [phone] between 1PM - 3PM on:11-26-20 FRIDAY  REMEMBER: Instructions that are not followed completely may result in serious medical risk, up to and including death; or upon the discretion of your surgeon and anesthesiologist your surgery may need to be rescheduled.  Do not eat food after midnight the night before surgery.  No gum chewing, lozengers or hard candies.  You may however, drink CLEAR liquids up to 2 hours before you are scheduled to arrive for your surgery. Do not drink anything within 2 hours of your scheduled arrival time.  Clear liquids include: - water  - apple juice without pulp - gatorade - black coffee or tea (Do NOT add milk or creamers to the coffee or tea) Do NOT drink anything that is not on this list..  TAKE THESE MEDICATIONS THE MORNING OF SURGERY WITH A SIP OF WATER: -Allopurinol (Zyloprim) -Metoprolol (Lopressor) -Simvastatin (Zocor) -Tamsulosin (Flomax)  -Call Dr Harlow Mares [phone]) today (11-19-20) about when you need to stop your Xarelto   One week prior to surgery: Stop Anti-inflammatories (NSAIDS) such as Advil, Aleve, Ibuprofen, Motrin, Naproxen, Naprosyn and Aspirin based products such as Excedrin, Goodys Powder, BC Powder.You may however, continue to take Tylenol if needed for pain up until the day of surgery.  Stop ANY OVER THE COUNTER supplements/vitamns 7 days prior to surgery (Vitamin C and D)  No Alcohol for 24 hours before or after surgery.  No Smoking including e-cigarettes for 24 hours prior to surgery.  No chewable tobacco products for at least 6 hours prior to surgery.  No nicotine patches on the day of surgery.  Do not use any "recreational" drugs for at least a week prior to your surgery.  Please be  advised that the combination of cocaine and anesthesia may have negative outcomes, up to and including death. If you test positive for cocaine, your surgery will be cancelled.  On the morning of surgery brush your teeth with toothpaste and water, you may rinse your mouth with mouthwash if you wish. Do not swallow any toothpaste or mouthwash.  Do not wear jewelry, make-up, hairpins, clips or nail polish.  Do not wear lotions, powders, or perfumes.   Do not shave body from the neck down 48 hours prior to surgery just in case you cut yourself which could leave a site for infection.  Also, freshly shaved skin may become irritated if using the CHG soap.  Contact lenses, hearing aids and dentures may not be worn into surgery.  Do not bring valuables to the hospital. [facility] is not responsible for any missing/lost belongings or valuables.   Use CHG Soap as directed on instruction sheet.   Notify your doctor if there is any change in your medical condition (cold, fever, infection).  Wear comfortable clothing (specific to your surgery type) to the hospital.  Plan for stool softeners for home use; pain medications have a tendency to cause constipation. You can also help prevent constipation by eating foods high in fiber such as fruits and vegetables and drinking plenty of fluids as your diet allows.  After surgery, you can help prevent lung complications by doing breathing exercises.  Take deep breaths and cough every 1-2 hours. Your doctor may order a device called an Incentive Spirometer to help you take deep  breaths. When coughing or sneezing, hold a pillow firmly against your incision with both hands. This is called "splinting." Doing this helps protect your incision. It also decreases belly discomfort.  If you are being admitted to the hospital overnight, leave your suitcase in the car. After surgery it may be brought to your room.  If you are being discharged the day of surgery, you  will not be allowed to drive home. You will need a responsible adult (18 years or older) to drive you home and stay with you that night.   If you are taking public transportation, you will need to have a responsible adult (18 years or older) with you. Please confirm with your physician that it is acceptable to use public transportation.   Please call the [facility] at [phone] if you have any questions about these instructions.  Surgery Visitation Policy:  Patients undergoing a surgery or procedure may have one family member or support person with them as long as that person is not COVID-19 positive or experiencing its symptoms.  That person may remain in the waiting area during the procedure.  Inpatient Visitation:    Visiting hours are 7 a.m. to 8 p.m. Inpatients will be allowed two visitors daily. The visitors may change each day during the patient's stay. No visitors under the age of 76. Any visitor under the age of 62 must be accompanied by an adult. The visitor must pass COVID-19 screenings, use hand sanitizer when entering and exiting the patient's room and wear a mask at all times, including in the patient's room. Patients must also wear a mask when staff or their visitor are in the room. Masking is required regardless of vaccination status.  Total Hip/Knee Replacement Preoperative Educational Video  To better prepare for surgery, please view our videos that explain the physical activity and discharge planning required to have the best surgical recovery at [facility].  [email or web address]      Questions? Call [phone] or email [email or web address]

## 2020-11-23 DIAGNOSIS — M25661 Stiffness of right knee, not elsewhere classified: Secondary | ICD-10-CM | POA: Diagnosis not present

## 2020-11-23 DIAGNOSIS — M25561 Pain in right knee: Secondary | ICD-10-CM | POA: Diagnosis not present

## 2020-11-23 DIAGNOSIS — Z0000 Encounter for general adult medical examination without abnormal findings: Secondary | ICD-10-CM | POA: Insufficient documentation

## 2020-11-24 ENCOUNTER — Encounter: Payer: Self-pay | Admitting: Orthopedic Surgery

## 2020-11-24 NOTE — Progress Notes (Signed)
Perioperative Services  Pre-Admission/Anesthesia Testing Clinical Review  Date: 11/24/20  Patient Demographics:  Name: Grant Schmitt DOB:   [birth year] MRN:   [id number]  Planned Surgical Procedure(s):    Case: [id number] Date/Time: 11/29/20 1200   Procedure: TOTAL KNEE ARTHROPLASTY (Left Knee)   Anesthesia type: Spinal   Pre-op diagnosis: M17.12 Unilateral primary osteoarthritis, left knee   Location: [facility] OR ROOM 02 / [place] [facility] FOR ANESTHES[facility]   Surgeons: Lovell Sheehan, MD    NOTE: Available PAT nursing documentation and vital signs have been reviewed. Clinical nursing staff has updated patient's PMH/PSHx, current medication list, and drug allergies/intolerances to ensure comprehensive history available to assist in medical decision making as it pertains to the aforementioned surgical procedure and anticipated anesthetic course. Extensive review of available clinical information performed. [place] PMH and PSHx updated with any diagnoses/procedures that  may have been inadvertently omitted during his intake with the pre-admission testing department's nursing staff.  Clinical Discussion:  [name] is a 87 y.o. male who is submitted for pre-surgical anesthesia review and clearance prior to him undergoing the above procedure.Patient is a Former Smoker (20 pack years; quit 06/1958). Pertinent PMH includes: atrial fibrillation, cardiomyopathy, sick sinus syndrome (s/p PPM placement), valvular insufficiency, aortic stenosis, HTN, HLD, GERD (occasional CaCO3 tablets), OSA (does not use nocturnal PAP therapy), OA.  Patient is followed by cardiology Ubaldo Glassing, MD). He was last seen in the cardiology clinic on 12/05/2018; notes reviewed.  At the time of his clinic visit, patient doing "fairly well" from a cardiovascular perspective.  He denied any chest pain, shortness breath, PND, orthopnea, palpitations, peripheral edema, vertiginous symptoms, or presyncope/syncope.  Past medical history  significant for cardiovascular diagnoses.  Patient has a pacemaker in place for rhythm management of his sick sinus syndrome; device interrogated on a regular basis. He also has a diagnosis of atrial fibrillation. CHA2DS2-VASc Score = 3 (age x 2, HTN).  Patient chronically anticoagulated using rivaroxaban; compliant with therapy with no evidence of GI bleeding.  Last TTE performed on 06/07/2018 revealed globally normal systolic function, mild valvular regurgitation, and moderate PHTN.  Study also revealed mild aortic valve stenosis with a mean gradient of 11.6 mmHg (see full interpretation of cardiovascular testing below).  Patient on GDMT for his HTN and HLD diagnoses.  Blood pressure well controlled on currently prescribed ARB and beta-blocker therapies.  He is on a statin for his HLD. Functional capacity, as defined by DASI, is documented as being >/= 4 METS.  No changes were made to patient's medication regimen.  Patient to follow-up with outpatient cardiology in 6 months or sooner if needed.  Patient is scheduled for an elective total knee arthroplasty on 11/29/2020 with Dr. Kurtis Bushman.  Given patient's past medical history significant for cardiovascular diagnoses, presurgical cardiac clearance was sought by the performing surgeon's office and PAT team. Per cardiology, "this patient is optimized for surgery and may proceed with the planned procedural course with a MODERATE risk stratification". This patient is on daily anticoagulation therapy.  Cardiology did not specify hold.  For patient's anticoagulation therapy.  Additionally, he advised that he had not been instructed by surgeon when to stop his daily rivaroxaban dose.  Patient was encouraged to place a call to Dr. Harlow Mares' office to determine when he needed to begin holding this medication.    Patient denies previous perioperative complications with anesthesia in the past. In review of the available records, it is noted that patient underwent a  general anesthetic course here (ASA  IV) in 06/2018 without documented complications.   Vitals with BMI 11/19/2020 11/09/2020 11/09/2020  Height 5\' 11"  - -  Weight 207 lbs 4 oz - 211 lbs 10 oz  BMI 21.19 - -  Systolic 417 408 86  Diastolic 86 60 53  Pulse 71 - 84    Providers/Specialists:   NOTE: Primary physician provider listed below. Patient may have been seen by APP or partner within same practice.   PROVIDER ROLE / SPECIALTY LAST Jayme Cloud, MD  Orthopedics (Surgeon)  11/18/2020  Birdie Sons, MD  Primary Care Provider  11/09/2020  Bartholome Bill, MD  Cardiology  12/05/2018   Allergies:  Patient has no known allergies.  Current Home Medications:   No current facility-administered medications for this encounter.   Marland Kitchen allopurinol (ZYLOPRIM) 300 MG tablet  . Ascorbic Acid (VITAMIN C) 1000 MG tablet  . Cholecalciferol (VITAMIN D3) 2000 units TABS  . losartan (COZAAR) 50 MG tablet  . metoprolol tartrate (LOPRESSOR) 50 MG tablet  . simvastatin (ZOCOR) 20 MG tablet  . tamsulosin (FLOMAX) 0.4 MG CAPS capsule  . XARELTO 20 MG TABS tablet  . calcium carbonate (TUMS - DOSED IN MG ELEMENTAL CALCIUM) 500 MG chewable tablet  . clotrimazole-betamethasone (LOTRISONE) lotion  . triamcinolone cream (KENALOG) 0.1 %   History:   Past Medical History:  Diagnosis Date  . A-fib ([place])   . Allergy   . Aortic insufficiency with aortic stenosis   . Arthritis   . Cardiomyopathy, secondary ([place])   . Chronic anticoagulation    Rivaroxaban  . GERD (gastroesophageal reflux disease)    occ-tums prn  . Gout   . H/O hemorrhoidectomy 1984  . History of chickenpox   . Hyperlipidemia   . Hypertension   . Pacemaker   . Sick sinus syndrome ([place])   . Skin cancer   . Sleep apnea    does not use cpap   . Tricuspid regurgitation   . Vertigo    Past Surgical History:  Procedure Laterality Date  . CATARACT EXTRACTION W/ INTRAOCULAR LENS IMPLANT Left   . COLONOSCOPY    . COLONOSCOPY  W/ POLYPECTOMY    . EYE SURGERY    . hemorrhoidectomy  1984  . IMPLANTABLE CARDIOVERTER DEFIBRILLATOR (ICD) GENERATOR CHANGE Left 06/26/2018   Procedure: SINGLE CHAMBER BATTERY CHANGEOUT;  Surgeon: Isaias Cowman, MD;  Location: [facility] ORS;  Service: Cardiovascular;  Laterality: Left;  . INSERT / REPLACE / REMOVE PACEMAKER  2012  . MOHS SURGERY Right 05/14/2018   ear. Dr. Karin Golden [facility]  . REPLACEMENT TOTAL KNEE Right 2005   No family history on file. Social History   Tobacco Use  . Smoking status: Former Smoker    Packs/day: 1.00    Years: 20.00    Pack years: 20.00    Types: Cigarettes    Quit date: 06/19/1958    Years since quitting: 62.4  . Smokeless tobacco: Never Used  Vaping Use  . Vaping Use: Never used  Substance Use Topics  . Alcohol use: Yes    Alcohol/week: 0.0 - 1.0 standard drinks  . Drug use: No    Comment: occ beer    Pertinent Clinical Results:  LABS: Labs reviewed: Acceptable for surgery.  No visits with results within 3 Day(s) from this visit.  Latest known visit with results is:  Hospital Outpatient Visit on 11/19/2020  Component Date Value Ref Range Status  . MRSA, PCR 11/19/2020 NEGATIVE  NEGATIVE Final  . Staphylococcus  aureus 11/19/2020 NEGATIVE  NEGATIVE Final   Comment: (NOTE) The Xpert SA Assay (FDA approved for NASAL specimens in patients 76 years of age and older), is one component of a comprehensive surveillance program. It is not intended to diagnose infection nor to guide or monitor treatment. Performed at [facility], [address] [place], [place] [address]   . ABO/RH(D) 11/19/2020 O POS   Final  . Antibody Screen 11/19/2020 NEG   Final  . Sample Expiration 11/19/2020 12/03/2020,2359   Final  . Extend sample reason 11/19/2020    Final                   Value:NO TRANSFUSIONS OR PREGNANCY IN THE PAST 3 MONTHS Performed at [facility], [address] [place], [place] [address]   . WBC  11/19/2020 9.5  4.0 - 10.5 K/uL Final  . RBC 11/19/2020 4.01* 4.22 - 5.81 MIL/uL Final  . Hemoglobin 11/19/2020 13.1  13.0 - 17.0 g/dL Final  . HCT 11/19/2020 39.2  39.0 - 52.0 % Final  . MCV 11/19/2020 97.8  80.0 - 100.0 fL Final  . MCH 11/19/2020 32.7  26.0 - 34.0 pg Final  . MCHC 11/19/2020 33.4  30.0 - 36.0 g/dL Final  . RDW 11/19/2020 13.2  11.5 - 15.5 % Final  . Platelets 11/19/2020 238  150 - 400 K/uL Final  . nRBC 11/19/2020 0.0  0.0 - 0.2 % Final   Performed at [facility], [address] [place], [place] [address]  . Sodium 11/19/2020 137  135 - 145 mmol/L Final  . Potassium 11/19/2020 4.2  3.5 - 5.1 mmol/L Final  . Chloride 11/19/2020 103  98 - 111 mmol/L Final  . CO2 11/19/2020 28  22 - 32 mmol/L Final  . Glucose, Bld 11/19/2020 108* 70 - 99 mg/dL Final   Glucose reference range applies only to samples taken after fasting for at least 8 hours.  . BUN 11/19/2020 41* 8 - 23 mg/dL Final  . Creatinine, Ser 11/19/2020 1.22  0.61 - 1.24 mg/dL Final  . Calcium 11/19/2020 10.2  8.9 - 10.3 mg/dL Final  . GFR, Estimated 11/19/2020 57* >60 mL/min Final   Comment: (NOTE) Calculated using the CKD-EPI Creatinine Equation (2021)   . Anion gap 11/19/2020 6  5 - 15 Final   Performed at [facility], [address] [place], [place] [address]  . Prothrombin Time 11/19/2020 23.6* 11.4 - 15.2 seconds Final  . INR 11/19/2020 2.1* 0.8 - 1.2 Final   Comment: (NOTE) INR goal varies based on device and disease states. Performed at [facility], [address], [place], [place] [address]   . aPTT 11/19/2020 44* 24 - 36 seconds Final   Comment:        IF BASELINE aPTT IS ELEVATED, SUGGEST PATIENT RISK ASSESSMENT BE USED TO DETERMINE APPROPRIATE ANTICOAGULANT THERAPY. Performed at [facility], [address], [place],  [address]   . Color, Urine 11/19/2020 YELLOW* YELLOW Final  . APPearance 11/19/2020 CLOUDY* CLEAR Final  . Specific  Gravity, Urine 11/19/2020 1.019  1.005 - 1.030 Final  . pH 11/19/2020 6.0  5.0 - 8.0 Final  . Glucose, UA 11/19/2020 NEGATIVE  NEGATIVE mg/dL Final  . Hgb urine dipstick 11/19/2020 NEGATIVE  NEGATIVE Final  . Bilirubin Urine 11/19/2020 NEGATIVE  NEGATIVE Final  . Ketones, ur 11/19/2020 NEGATIVE  NEGATIVE mg/dL Final  . Protein, ur 11/19/2020 NEGATIVE  NEGATIVE mg/dL Final  . Nitrite 11/19/2020 NEGATIVE  NEGATIVE Final  . Leukocytes,Ua 11/19/2020 LARGE* NEGATIVE Final  . RBC / HPF 11/19/2020 0-5  0 - 5 RBC/hpf Final  . WBC, UA 11/19/2020 >50* 0 - 5 WBC/hpf Final  . Bacteria, UA 11/19/2020 MANY* NONE SEEN Final  . Squamous Epithelial / LPF 11/19/2020 NONE SEEN  0 - 5 Final  . Mucus 11/19/2020 PRESENT   Final  . Hyaline Casts, UA 11/19/2020 PRESENT   Final   Performed at [facility], [address] [place], [place] [address]    ECG: Date: 11/19/2020 Time ECG obtained: 1057 AM Rate: 87 bpm Rhythm: Ventricular paced rhythm with occasional PVCs Intervals: QRS 154 ms. QTc 474 ms. ST segment and T wave changes: No evidence of acute ST segment elevation or depression Comparison: Similar to previous tracing obtained on 06/25/2018   IMAGING / PROCEDURES: TRANSTHORACIC ECHOCARDIOGRAM performed on 06/07/2018 1. LVEF >55% 2. Normal left ventricular systolic function with moderate LVH 3. Normal right ventricular systolic function 4. Moderate MR and TR 5. Mild AR and PR 6. Mild aortic valve stenosis; AVA (VTI) = 1.11 cm; mean gradient 11.6 mmHg 7. Moderate PHTN 8. Moderately enlarged right atrium 9. No evidence of pericardial effusion  Impression and Plan:  Grant Schmitt has been referred for pre-anesthesia review and clearance prior to him undergoing the planned anesthetic and procedural courses. Available labs, pertinent testing, and imaging results were personally reviewed by me.  Results from labs were forwarded to primary attending surgeon on the day that were obtained.   Of note, attempted to add urine culture to assess for pathogenically significant growth, however lab unable to process.  Again, result from the UA was sent to patient's surgeon for review and consideration of treatment by PAT staff in my absence.    This patient has been appropriately cleared by cardiology with an overall MODERATE risk of significant perioperative cardiovascular complications. Completed perioperative prescription for cardiac device management documentation completed by primary cardiology team and placed on patient's chart for review by the surgical/anesthetic team on the day of his procedure. Based on clinical review performed today (11/24/20), barring any significant acute changes in the patient's overall condition, it is anticipated that he will be able to proceed with the planned surgical intervention. Any acute changes in clinical condition may necessitate his procedure being postponed and/or cancelled. Patient will meet with anesthesia team (MD and/or CRNA) on the day of his procedure for preoperative evaluation/assessment. Questions regarding anesthetic course will be fielded at that time.   Pre-surgical instructions were reviewed with the patient during his PAT appointment and questions were fielded by PAT clinical staff. Patient was advised that if any questions or concerns arise prior to his procedure then he should return a call to PAT and/or his surgeon's office to discuss.  Honor Loh, MSN, APRN, FNP-C, CEN [facility]  Peri-operative Services Nurse Practitioner Phone: [phone] 11/24/20 8:11 AM  NOTE: This note has been prepared using Dragon dictation software. Despite my best ability to proofread, there is always the potential that unintentional transcriptional errors may still occur from this process.

## 2020-11-25 ENCOUNTER — Other Ambulatory Visit: Payer: Self-pay

## 2020-11-25 ENCOUNTER — Other Ambulatory Visit
Admission: RE | Admit: 2020-11-25 | Discharge: 2020-11-25 | Disposition: A | Discharge status: Home / Self Care | Payer: Medicare Other | Source: Ambulatory Visit | Attending: Orthopedic Surgery | Admitting: Orthopedic Surgery

## 2020-11-25 DIAGNOSIS — Z20822 Contact with and (suspected) exposure to covid-19: Secondary | ICD-10-CM | POA: Diagnosis not present

## 2020-11-25 DIAGNOSIS — Z01812 Encounter for preprocedural laboratory examination: Secondary | ICD-10-CM | POA: Insufficient documentation

## 2020-11-25 LAB — SARS CORONAVIRUS 2 (TAT 6-24 HRS): SARS Coronavirus 2: NEGATIVE

## 2020-11-28 MED ORDER — TRANEXAMIC ACID-NACL 1000-0.7 MG/100ML-% IV SOLN
1000.0000 mg | INTRAVENOUS | Status: AC
  Administered 2020-11-29: 1000 mg via INTRAVENOUS

## 2020-11-28 MED ORDER — CEFAZOLIN SODIUM-DEXTROSE 2-4 GM/100ML-% IV SOLN
2.0000 g | INTRAVENOUS | Status: AC
  Administered 2020-11-29: 2 g via INTRAVENOUS

## 2020-11-28 MED ORDER — LACTATED RINGERS IV SOLN: INTRAVENOUS | Status: DC

## 2020-11-28 MED ORDER — CHLORHEXIDINE GLUCONATE 0.12 % MT SOLN: 15.0000 mL | Freq: Once | OROMUCOSAL | Status: AC

## 2020-11-28 MED ORDER — FAMOTIDINE 20 MG PO TABS: 20.0000 mg | ORAL_TABLET | Freq: Once | ORAL | Status: AC

## 2020-11-28 MED ORDER — ORAL CARE MOUTH RINSE: 15.0000 mL | Freq: Once | OROMUCOSAL | Status: AC

## 2020-11-29 ENCOUNTER — Encounter
Admission: RE | Disposition: A | Discharge status: Home / Self Care | Payer: Self-pay | Source: Home / Self Care | Attending: Orthopedic Surgery

## 2020-11-29 ENCOUNTER — Encounter: Payer: Self-pay | Admitting: Orthopedic Surgery

## 2020-11-29 ENCOUNTER — Other Ambulatory Visit: Payer: Self-pay

## 2020-11-29 ENCOUNTER — Inpatient Hospital Stay: Payer: Medicare Other | Admitting: Urgent Care

## 2020-11-29 ENCOUNTER — Inpatient Hospital Stay: Payer: Medicare Other

## 2020-11-29 ENCOUNTER — Inpatient Hospital Stay
Admission: RE | Admit: 2020-11-29 | Discharge: 2020-12-01 | DRG: 470 | Disposition: A | Discharge status: Home / Self Care | Payer: Medicare Other | Attending: Orthopedic Surgery | Admitting: Orthopedic Surgery

## 2020-11-29 DIAGNOSIS — E785 Hyperlipidemia, unspecified: Secondary | ICD-10-CM | POA: Diagnosis not present

## 2020-11-29 DIAGNOSIS — Z79899 Other long term (current) drug therapy: Secondary | ICD-10-CM | POA: Diagnosis not present

## 2020-11-29 DIAGNOSIS — I495 Sick sinus syndrome: Secondary | ICD-10-CM | POA: Diagnosis present

## 2020-11-29 DIAGNOSIS — Z7901 Long term (current) use of anticoagulants: Secondary | ICD-10-CM | POA: Diagnosis not present

## 2020-11-29 DIAGNOSIS — M25562 Pain in left knee: Secondary | ICD-10-CM | POA: Diagnosis not present

## 2020-11-29 DIAGNOSIS — Z87891 Personal history of nicotine dependence: Secondary | ICD-10-CM | POA: Diagnosis not present

## 2020-11-29 DIAGNOSIS — I4891 Unspecified atrial fibrillation: Secondary | ICD-10-CM | POA: Diagnosis not present

## 2020-11-29 DIAGNOSIS — I429 Cardiomyopathy, unspecified: Secondary | ICD-10-CM | POA: Diagnosis present

## 2020-11-29 DIAGNOSIS — Z96652 Presence of left artificial knee joint: Secondary | ICD-10-CM | POA: Diagnosis not present

## 2020-11-29 DIAGNOSIS — M1712 Unilateral primary osteoarthritis, left knee: Secondary | ICD-10-CM | POA: Diagnosis not present

## 2020-11-29 DIAGNOSIS — Z950 Presence of cardiac pacemaker: Secondary | ICD-10-CM

## 2020-11-29 DIAGNOSIS — K219 Gastro-esophageal reflux disease without esophagitis: Secondary | ICD-10-CM | POA: Diagnosis not present

## 2020-11-29 DIAGNOSIS — M109 Gout, unspecified: Secondary | ICD-10-CM | POA: Diagnosis not present

## 2020-11-29 DIAGNOSIS — G8929 Other chronic pain: Secondary | ICD-10-CM | POA: Diagnosis not present

## 2020-11-29 DIAGNOSIS — Z471 Aftercare following joint replacement surgery: Secondary | ICD-10-CM | POA: Diagnosis not present

## 2020-11-29 DIAGNOSIS — I10 Essential (primary) hypertension: Secondary | ICD-10-CM | POA: Diagnosis not present

## 2020-11-29 DIAGNOSIS — Z20822 Contact with and (suspected) exposure to covid-19: Secondary | ICD-10-CM | POA: Diagnosis present

## 2020-11-29 DIAGNOSIS — Z419 Encounter for procedure for purposes other than remedying health state, unspecified: Secondary | ICD-10-CM

## 2020-11-29 HISTORY — PX: TOTAL KNEE ARTHROPLASTY: SHX125

## 2020-11-29 HISTORY — DX: Dizziness and giddiness: R42

## 2020-11-29 HISTORY — DX: Unspecified malignant neoplasm of skin, unspecified: C44.90

## 2020-11-29 HISTORY — DX: Long term (current) use of anticoagulants: Z79.01

## 2020-11-29 SURGERY — ARTHROPLASTY, KNEE, TOTAL
Anesthesia: Spinal | Site: Knee | Laterality: Left

## 2020-11-29 MED ORDER — CHLORHEXIDINE GLUCONATE 0.12 % MT SOLN
OROMUCOSAL | Status: AC
  Administered 2020-11-29: 15 mL via OROMUCOSAL
  Filled 2020-11-29: qty 15

## 2020-11-29 MED ORDER — SODIUM CHLORIDE 0.9 % IV SOLN
INTRAVENOUS | Status: DC | PRN
  Administered 2020-11-29: 60 mL

## 2020-11-29 MED ORDER — CALCIUM CARBONATE ANTACID 500 MG PO CHEW: 1.0000 | CHEWABLE_TABLET | ORAL | Status: DC | PRN

## 2020-11-29 MED ORDER — METOCLOPRAMIDE HCL 10 MG PO TABS: 5.0000 mg | ORAL_TABLET | Freq: Three times a day (TID) | ORAL | Status: DC | PRN

## 2020-11-29 MED ORDER — BUPIVACAINE LIPOSOME 1.3 % IJ SUSP
INTRAMUSCULAR | Status: AC
  Filled 2020-11-29: qty 20

## 2020-11-29 MED ORDER — LACTATED RINGERS IV SOLN: INTRAVENOUS | Status: DC

## 2020-11-29 MED ORDER — TRANEXAMIC ACID-NACL 1000-0.7 MG/100ML-% IV SOLN
INTRAVENOUS | Status: AC
  Filled 2020-11-29: qty 100

## 2020-11-29 MED ORDER — RIVAROXABAN 20 MG PO TABS
20.0000 mg | ORAL_TABLET | Freq: Every day | ORAL | Status: DC
  Administered 2020-11-30: 20 mg via ORAL
  Filled 2020-11-29: qty 1

## 2020-11-29 MED ORDER — BUPIVACAINE-EPINEPHRINE (PF) 0.5% -1:200000 IJ SOLN
INTRAMUSCULAR | Status: DC | PRN
  Administered 2020-11-29: 30 mL via PERINEURAL

## 2020-11-29 MED ORDER — BUPIVACAINE HCL (PF) 0.5 % IJ SOLN
INTRAMUSCULAR | Status: AC
  Filled 2020-11-29: qty 10

## 2020-11-29 MED ORDER — LOSARTAN POTASSIUM 50 MG PO TABS
50.0000 mg | ORAL_TABLET | Freq: Every day | ORAL | Status: DC
  Administered 2020-11-30 – 2020-12-01 (×2): 50 mg via ORAL
  Filled 2020-11-29 (×2): qty 1

## 2020-11-29 MED ORDER — FENTANYL CITRATE (PF) 100 MCG/2ML IJ SOLN: 25.0000 ug | INTRAMUSCULAR | Status: DC | PRN

## 2020-11-29 MED ORDER — ROPIVACAINE HCL 5 MG/ML IJ SOLN
INTRAMUSCULAR | Status: AC
  Filled 2020-11-29: qty 30

## 2020-11-29 MED ORDER — ACETAMINOPHEN 325 MG PO TABS: 325.0000 mg | ORAL_TABLET | Freq: Four times a day (QID) | ORAL | Status: DC | PRN

## 2020-11-29 MED ORDER — FENTANYL CITRATE (PF) 100 MCG/2ML IJ SOLN: 50.0000 ug | INTRAMUSCULAR | Status: DC | PRN

## 2020-11-29 MED ORDER — ALLOPURINOL 300 MG PO TABS
300.0000 mg | ORAL_TABLET | Freq: Every day | ORAL | Status: DC
  Administered 2020-11-30 – 2020-12-01 (×3): 300 mg via ORAL
  Filled 2020-11-29 (×2): qty 1

## 2020-11-29 MED ORDER — TAMSULOSIN HCL 0.4 MG PO CAPS
0.4000 mg | ORAL_CAPSULE | Freq: Every day | ORAL | Status: DC
  Administered 2020-11-30 – 2020-12-01 (×2): 0.4 mg via ORAL
  Filled 2020-11-29 (×2): qty 1

## 2020-11-29 MED ORDER — KETOROLAC TROMETHAMINE 15 MG/ML IJ SOLN
7.5000 mg | Freq: Four times a day (QID) | INTRAMUSCULAR | Status: AC
  Administered 2020-11-29 – 2020-11-30 (×4): 7.5 mg via INTRAVENOUS
  Filled 2020-11-29 (×4): qty 1

## 2020-11-29 MED ORDER — ACETAMINOPHEN 10 MG/ML IV SOLN
INTRAVENOUS | Status: AC
  Filled 2020-11-29: qty 100

## 2020-11-29 MED ORDER — ALUM & MAG HYDROXIDE-SIMETH 200-200-20 MG/5ML PO SUSP: 30.0000 mL | ORAL | Status: DC | PRN

## 2020-11-29 MED ORDER — MORPHINE SULFATE (PF) 2 MG/ML IV SOLN: 0.5000 mg | INTRAVENOUS | Status: DC | PRN

## 2020-11-29 MED ORDER — MENTHOL 3 MG MT LOZG
1.0000 | LOZENGE | OROMUCOSAL | Status: DC | PRN
  Filled 2020-11-29: qty 9

## 2020-11-29 MED ORDER — DEXMEDETOMIDINE (PRECEDEX) IN NS 20 MCG/5ML (4 MCG/ML) IV SYRINGE
PREFILLED_SYRINGE | INTRAVENOUS | Status: AC
  Filled 2020-11-29: qty 5

## 2020-11-29 MED ORDER — DIPHENHYDRAMINE HCL 12.5 MG/5ML PO ELIX: 12.5000 mg | ORAL_SOLUTION | ORAL | Status: DC | PRN

## 2020-11-29 MED ORDER — ONDANSETRON HCL 4 MG PO TABS: 4.0000 mg | ORAL_TABLET | Freq: Four times a day (QID) | ORAL | Status: DC | PRN

## 2020-11-29 MED ORDER — BISACODYL 10 MG RE SUPP: 10.0000 mg | Freq: Every day | RECTAL | Status: DC | PRN

## 2020-11-29 MED ORDER — SIMVASTATIN 20 MG PO TABS
20.0000 mg | ORAL_TABLET | Freq: Every day | ORAL | Status: DC
  Administered 2020-11-30 – 2020-12-01 (×2): 20 mg via ORAL
  Filled 2020-11-29 (×2): qty 1

## 2020-11-29 MED ORDER — ACETAMINOPHEN 10 MG/ML IV SOLN
INTRAVENOUS | Status: DC | PRN
  Administered 2020-11-29: 1000 mg via INTRAVENOUS

## 2020-11-29 MED ORDER — FAMOTIDINE 20 MG PO TABS
ORAL_TABLET | ORAL | Status: AC
  Administered 2020-11-29: 20 mg via ORAL
  Filled 2020-11-29: qty 1

## 2020-11-29 MED ORDER — ONDANSETRON HCL 4 MG/2ML IJ SOLN
4.0000 mg | Freq: Once | INTRAMUSCULAR | Status: DC | PRN
Start: 2020-11-29 — End: 2020-11-29

## 2020-11-29 MED ORDER — PHENYLEPHRINE HCL (PRESSORS) 10 MG/ML IV SOLN
INTRAVENOUS | Status: DC | PRN
  Administered 2020-11-29: 200 ug via INTRAVENOUS
  Administered 2020-11-29 (×2): 100 ug via INTRAVENOUS
  Administered 2020-11-29: 200 ug via INTRAVENOUS

## 2020-11-29 MED ORDER — CEFAZOLIN SODIUM-DEXTROSE 2-4 GM/100ML-% IV SOLN
INTRAVENOUS | Status: AC
  Administered 2020-11-29: 2 g via INTRAVENOUS
  Filled 2020-11-29: qty 100

## 2020-11-29 MED ORDER — BUPIVACAINE HCL (PF) 0.5 % IJ SOLN
INTRAMUSCULAR | Status: DC | PRN
  Administered 2020-11-29: 2.5 mL via INTRATHECAL

## 2020-11-29 MED ORDER — METOPROLOL TARTRATE 50 MG PO TABS
50.0000 mg | ORAL_TABLET | Freq: Every day | ORAL | Status: DC
  Administered 2020-11-30 – 2020-12-01 (×2): 50 mg via ORAL
  Filled 2020-11-29 (×2): qty 1

## 2020-11-29 MED ORDER — HYDROCODONE-ACETAMINOPHEN 5-325 MG PO TABS
1.0000 | ORAL_TABLET | ORAL | Status: DC | PRN
  Administered 2020-11-29: 1 via ORAL
  Administered 2020-11-30 – 2020-12-01 (×2): 2 via ORAL
  Filled 2020-11-29: qty 1
  Filled 2020-11-29 (×2): qty 2

## 2020-11-29 MED ORDER — ONDANSETRON HCL 4 MG/2ML IJ SOLN: 4.0000 mg | Freq: Four times a day (QID) | INTRAMUSCULAR | Status: DC | PRN

## 2020-11-29 MED ORDER — PROPOFOL 500 MG/50ML IV EMUL
INTRAVENOUS | Status: DC | PRN
  Administered 2020-11-29: 75 ug/kg/min via INTRAVENOUS

## 2020-11-29 MED ORDER — LIDOCAINE HCL (PF) 1 % IJ SOLN
INTRAMUSCULAR | Status: AC
  Filled 2020-11-29: qty 5

## 2020-11-29 MED ORDER — POVIDONE-IODINE 10 % EX SWAB
2.0000 "application " | Freq: Once | CUTANEOUS | Status: AC
  Administered 2020-11-29: 2 via TOPICAL

## 2020-11-29 MED ORDER — DOCUSATE SODIUM 100 MG PO CAPS
100.0000 mg | ORAL_CAPSULE | Freq: Two times a day (BID) | ORAL | Status: DC
  Administered 2020-11-29 – 2020-12-01 (×4): 100 mg via ORAL
  Filled 2020-11-29 (×4): qty 1

## 2020-11-29 MED ORDER — PHENOL 1.4 % MT LIQD
1.0000 | OROMUCOSAL | Status: DC | PRN
  Filled 2020-11-29: qty 177

## 2020-11-29 MED ORDER — LIDOCAINE HCL (CARDIAC) PF 100 MG/5ML IV SOSY
PREFILLED_SYRINGE | INTRAVENOUS | Status: DC | PRN
  Administered 2020-11-29: 50 mg via INTRAVENOUS

## 2020-11-29 MED ORDER — BUPIVACAINE-EPINEPHRINE (PF) 0.5% -1:200000 IJ SOLN
INTRAMUSCULAR | Status: AC
  Filled 2020-11-29: qty 30

## 2020-11-29 MED ORDER — HYDROCODONE-ACETAMINOPHEN 7.5-325 MG PO TABS
1.0000 | ORAL_TABLET | ORAL | Status: DC | PRN
  Administered 2020-11-30: 2 via ORAL
  Filled 2020-11-29: qty 2

## 2020-11-29 MED ORDER — PROPOFOL 10 MG/ML IV BOLUS
INTRAVENOUS | Status: DC | PRN
  Administered 2020-11-29: 30 mg via INTRAVENOUS

## 2020-11-29 MED ORDER — FENTANYL CITRATE (PF) 100 MCG/2ML IJ SOLN
INTRAMUSCULAR | Status: AC
  Administered 2020-11-29: 50 ug via INTRAVENOUS
  Filled 2020-11-29: qty 2

## 2020-11-29 MED ORDER — SODIUM CHLORIDE 0.9 % IV SOLN
INTRAVENOUS | Status: DC | PRN
  Administered 2020-11-29: 20 ug/min via INTRAVENOUS

## 2020-11-29 MED ORDER — CEFAZOLIN SODIUM-DEXTROSE 2-4 GM/100ML-% IV SOLN
2.0000 g | Freq: Four times a day (QID) | INTRAVENOUS | Status: AC
  Administered 2020-11-29: 2 g via INTRAVENOUS
  Filled 2020-11-29 (×2): qty 100

## 2020-11-29 MED ORDER — METOCLOPRAMIDE HCL 5 MG/ML IJ SOLN: 5.0000 mg | Freq: Three times a day (TID) | INTRAMUSCULAR | Status: DC | PRN

## 2020-11-29 MED ORDER — MAGNESIUM CITRATE PO SOLN
1.0000 | Freq: Once | ORAL | Status: DC | PRN
  Filled 2020-11-29: qty 296

## 2020-11-29 MED ORDER — SODIUM CHLORIDE FLUSH 0.9 % IV SOLN
INTRAVENOUS | Status: AC
  Filled 2020-11-29: qty 40

## 2020-11-29 SURGICAL SUPPLY — 60 items
BASEPLATE TIBIAL LT SZ6 (Joint) ×3 IMPLANT
BLADE SAGITTAL AGGR TOOTH XLG (BLADE) ×3 IMPLANT
BLADE SAW SAG 25X90X1.19 (BLADE) ×3 IMPLANT
BOWL CEMENT MIX W/ADAPTER (MISCELLANEOUS) ×3 IMPLANT
BRUSH SCRUB EZ  4% CHG (MISCELLANEOUS) ×2
BRUSH SCRUB EZ 4% CHG (MISCELLANEOUS) ×1 IMPLANT
CANISTER SUCT 1200ML W/VALVE (MISCELLANEOUS) ×3 IMPLANT
CEMENT BONE 40GM (Cement) ×6 IMPLANT
CHLORAPREP W/TINT 26 (MISCELLANEOUS) ×6 IMPLANT
COMP PATELLA GENESIS 35MM (Stem) ×3 IMPLANT
COMPONENT PATELLA GENESIS 35MM (Stem) ×1 IMPLANT
COOLER POLAR GLACIER W/PUMP (MISCELLANEOUS) ×3 IMPLANT
COVER WAND RF STERILE (DRAPES) ×3 IMPLANT
CUFF TOURN SGL QUICK 34 (TOURNIQUET CUFF) ×2
CUFF TRNQT CYL 34X4.125X (TOURNIQUET CUFF) ×1 IMPLANT
DRAPE 3/4 80X56 (DRAPES) ×6 IMPLANT
DRAPE INCISE IOBAN 66X60 STRL (DRAPES) ×3 IMPLANT
ELECT REM PT RETURN 9FT ADLT (ELECTROSURGICAL) ×3
ELECTRODE REM PT RTRN 9FT ADLT (ELECTROSURGICAL) ×1 IMPLANT
GAUZE SPONGE 4X4 12PLY STRL (GAUZE/BANDAGES/DRESSINGS) ×6 IMPLANT
GAUZE XEROFORM 1X8 LF (GAUZE/BANDAGES/DRESSINGS) ×3 IMPLANT
GLOVE SURG ENC MOIS LTX SZ8 (GLOVE) ×3 IMPLANT
GLOVE SURG ORTHO LTX SZ8 (GLOVE) ×9 IMPLANT
GLOVE SURG UNDER LTX SZ8 (GLOVE) ×3 IMPLANT
GLOVE SURG UNDER POLY LF SZ8.5 (GLOVE) ×3 IMPLANT
GOWN STRL REUS W/ TWL LRG LVL3 (GOWN DISPOSABLE) ×1 IMPLANT
GOWN STRL REUS W/ TWL XL LVL3 (GOWN DISPOSABLE) ×1 IMPLANT
GOWN STRL REUS W/TWL LRG LVL3 (GOWN DISPOSABLE) ×2
GOWN STRL REUS W/TWL XL LVL3 (GOWN DISPOSABLE) ×2
HOOD PEEL AWAY FLYTE STAYCOOL (MISCELLANEOUS) ×9 IMPLANT
IMPL FEM COMP OXINIUM S6 LT (Knees) ×1 IMPLANT
IMPLANT FEM COMP OXINIUM S6 LT (Knees) ×3 IMPLANT
INSERT ART HI FLEX 9 SZ5-6 (Insert) ×3 IMPLANT
IRRIGATION SURGIPHOR STRL (IV SOLUTION) ×3 IMPLANT
IV NS 1000ML (IV SOLUTION) ×2
IV NS 1000ML BAXH (IV SOLUTION) ×1 IMPLANT
KIT TURNOVER KIT A (KITS) ×3 IMPLANT
MANIFOLD NEPTUNE II (INSTRUMENTS) ×3 IMPLANT
MAT ABSORB  FLUID 56X50 GRAY (MISCELLANEOUS) ×2
MAT ABSORB FLUID 56X50 GRAY (MISCELLANEOUS) ×1 IMPLANT
NDL SAFETY ECLIPSE 18X1.5 (NEEDLE) ×1 IMPLANT
NEEDLE HYPO 18GX1.5 SHARP (NEEDLE) ×2
NEEDLE SPNL 20GX3.5 QUINCKE YW (NEEDLE) ×3 IMPLANT
NS IRRIG 1000ML POUR BTL (IV SOLUTION) ×3 IMPLANT
PACK TOTAL KNEE (MISCELLANEOUS) ×3 IMPLANT
PAD DE MAYO PRESSURE PROTECT (MISCELLANEOUS) ×3 IMPLANT
PAD WRAPON POLAR KNEE (MISCELLANEOUS) ×1 IMPLANT
PULSAVAC PLUS IRRIG FAN TIP (DISPOSABLE) ×3
STAPLER SKIN PROX 35W (STAPLE) ×3 IMPLANT
SUCTION FRAZIER HANDLE 10FR (MISCELLANEOUS) ×2
SUCTION TUBE FRAZIER 10FR DISP (MISCELLANEOUS) ×1 IMPLANT
SUT DVC 2 QUILL PDO  T11 36X36 (SUTURE) ×2
SUT DVC 2 QUILL PDO T11 36X36 (SUTURE) ×1 IMPLANT
SUT VIC AB 2-0 CT1 18 (SUTURE) ×3 IMPLANT
SUT VIC AB 2-0 CT1 27 (SUTURE)
SUT VIC AB 2-0 CT1 TAPERPNT 27 (SUTURE) IMPLANT
SUT VIC AB PLUS 45CM 1-MO-4 (SUTURE) ×3 IMPLANT
SYR 30ML LL (SYRINGE) ×9 IMPLANT
TIP FAN IRRIG PULSAVAC PLUS (DISPOSABLE) ×1 IMPLANT
WRAPON POLAR PAD KNEE (MISCELLANEOUS) ×3

## 2020-11-29 NOTE — Progress Notes (Signed)
Bladder scanned for 395cc

## 2020-11-29 NOTE — H&P (Signed)
The patient has been re-examined, and the chart reviewed, and there have been no interval changes to the documented history and physical.  Plan a left total knee today.  Anesthesia is consulted regarding a peripheral nerve block for post-operative pain.  The risks, benefits, and alternatives have been discussed at length, and the patient is willing to proceed.     

## 2020-11-29 NOTE — Anesthesia Preprocedure Evaluation (Signed)
Anesthesia Evaluation  Patient identified by MRN, date of birth, ID band Patient awake    Reviewed: Allergy & Precautions, NPO status , Patient's Chart, lab work & pertinent test results  History of Anesthesia Complications Negative for: history of anesthetic complications  Airway Mallampati: II  TM Distance: >3 FB Neck ROM: Full    Dental  (+) Edentulous Upper, Edentulous Lower   Pulmonary sleep apnea , neg COPD, former smoker,    breath sounds clear to auscultation- rhonchi (-) wheezing      Cardiovascular hypertension, Pt. on medications (-) CAD, (-) Past MI, (-) Cardiac Stents and (-) CABG + dysrhythmias Atrial Fibrillation + pacemaker  Rhythm:Regular Rate:Normal - Systolic murmurs and - Diastolic murmurs    Neuro/Psych neg Seizures negative neurological ROS  negative psych ROS   GI/Hepatic Neg liver ROS, GERD  ,  Endo/Other  negative endocrine ROSneg diabetes  Renal/GU Renal InsufficiencyRenal disease     Musculoskeletal  (+) Arthritis ,   Abdominal (+) - obese,   Peds  Hematology negative hematology ROS (+)   Anesthesia Other Findings Past Medical History: No date: A-fib ([place]) No date: Allergy No date: Aortic insufficiency with aortic stenosis No date: Arthritis No date: Cardiomyopathy, secondary ([place]) No date: Chronic anticoagulation     Comment:  Rivaroxaban No date: GERD (gastroesophageal reflux disease)     Comment:  occ-tums prn No date: Gout 1984: H/O hemorrhoidectomy No date: History of chickenpox No date: Hyperlipidemia No date: Hypertension No date: Pacemaker No date: Sick sinus syndrome ([place]) No date: Skin cancer No date: Sleep apnea     Comment:  does not use cpap  No date: Tricuspid regurgitation No date: Vertigo   Reproductive/Obstetrics                             Lab Results  Component Value Date   WBC 9.5 11/19/2020   HGB 13.1 11/19/2020   HCT 39.2  11/19/2020   MCV 97.8 11/19/2020   PLT 238 11/19/2020    Anesthesia Physical Anesthesia Plan  ASA: 3  Anesthesia Plan: Spinal   Post-op Pain Management:  Regional for Post-op pain   Induction:   PONV Risk Score and Plan: 1 and Propofol infusion  Airway Management Planned: Natural Airway  Additional Equipment:   Intra-op Plan:   Post-operative Plan:   Informed Consent: I have reviewed the patients History and Physical, chart, labs and discussed the procedure including the risks, benefits and alternatives for the proposed anesthesia with the patient or authorized representative who has indicated his/her understanding and acceptance.     Dental advisory given  Plan Discussed with: CRNA and Anesthesiologist  Anesthesia Plan Comments:         Anesthesia Quick Evaluation

## 2020-11-29 NOTE — Anesthesia Procedure Notes (Signed)
Spinal  Patient location during procedure: OR Start time: 11/29/2020 12:21 PM End time: 11/29/2020 12:34 PM Reason for block: surgical anesthesia Staffing Performed: anesthesiologist  Anesthesiologist: Emmie Niemann, MD Resident/CRNA: Lia Foyer, CRNA Preanesthetic Checklist Completed: patient identified, IV checked, site marked, risks and benefits discussed, surgical consent, monitors and equipment checked, pre-op evaluation and timeout performed Spinal Block Patient position: sitting Prep: ChloraPrep Patient monitoring: heart rate, continuous pulse ox and blood pressure Approach: midline Location: L3-4 Injection technique: single-shot Needle Needle type: Pencan  Needle gauge: 25 G Needle length: 9 cm Assessment Sensory level: T4 Events: CSF return Additional Notes Attempt x 1 CRNA, attempt x 1 MD

## 2020-11-29 NOTE — Op Note (Signed)
DATE OF SURGERY:  11/29/2020 TIME: 2:20 PM  PATIENT NAME:  Grant Schmitt   AGE: 87 y.o.    PRE-OPERATIVE DIAGNOSIS:  M17.12 Unilateral primary osteoarthritis, left knee  POST-OPERATIVE DIAGNOSIS:  Same  PROCEDURE:  Procedure(s): TOTAL KNEE ARTHROPLASTY, LEFT  SURGEON:  Lovell Sheehan, MD   ASSISTANT:  [name],  PA-C  OPERATIVE IMPLANTS: Tamala Julian [employer] Nephew, Cruciate Retaining Oxinium Femoral component size 6, Fixed Bearing Tray size 6, Patella polyethylene 3-peg oval button size 35 mm, with a 9 mm HighFlex insert.   PREOPERATIVE INDICATIONS:  Grant Schmitt is an 87 y.o. male who has a diagnosis of M17.12 Unilateral primary osteoarthritis, left knee and elected for a total knee arthroplasty after failing nonoperative treatment, including activity modification, pain medication, physical therapy and injections who has significant impairment of their activities of daily living.  Radiographs have demonstrated tricompartmental osteoarthritis joint space narrowing, osteophytes, subchondral sclerosis and cyst formation.  The risks, benefits, and alternatives were discussed at length including but not limited to the risks of infection, bleeding, nerve or blood vessel injury, knee stiffness, fracture, dislocation, loosening or failure of the hardware and the need for further surgery. Medical risks include but not limited to DVT and pulmonary embolism, myocardial infarction, stroke, pneumonia, respiratory failure and death. I discussed these risks with the patient in my office prior to the date of surgery. They understood these risks and were willing to proceed.  OPERATIVE FINDINGS AND UNIQUE ASPECTS OF THE CASE:  All three compartments with advanced and severe degenerative changes, large osteophytes and an abundance of synovial fluid. Significant deformity was also noted. A decision was made to proceed with total knee arthroplasty.   OPERATIVE DESCRIPTION:  The patient was brought to the  operative room and placed in a supine position after undergoing placement of a general anesthetic. IV antibiotics were given. Patient received tranexamic acid. The lower extremity was prepped and draped in the usual sterile fashion.  A time out was performed to verify the patient's name, date of birth, medical record number, correct site of surgery and correct procedure to be performed. The timeout was also used to confirm the patient received antibiotics and that appropriate instruments, implants and radiographs studies were available in the room.  The leg was elevated and exsanguinated with an Esmarch and the tourniquet was inflated to 250 mmHg.  A midline incision was made over the left knee.. A medial parapatellar arthrotomy was then made and the patella subluxed laterally and the knee was brought into 90 of flexion. Hoffa's fat pad along with the anterior cruciate ligament was resected and the medial joint line was exposed.  Attention was then turned to preparation of the patella. The thickness of the patella was measured with a caliper, the diameter measured with the patella templates.  The patella resection was then made with an oscillating saw using the patella cutting guide.  The 35 mm button fit appropriately.  3 peg holes for the patella component were then drilled.  The extramedullary tibial cutting guide was then placed using the anterior tibial crest and second ray of the foot as a reference.  The tibial cutting guide was adjusted to allow for appropriate posterior slope.  The tibial cutting block was pinned into position. The slotted stylus was used to measure the proximal tibial resection of 9 mm off the high lateral side. Care was taken during the tibial resection to protect the medial and collateral ligaments.  The resected tibial bone was removed.  The distal  femur was resected using the Visionaire cutting guide.  Care was taken to protect the collateral ligaments during distal femoral  resection.  The distal femoral resection was performed with an oscillating saw. The femoral cutting guide was then removed. Extension gap was measured with a 9 mm spacer block and alignment and extension was confirmed using a long alignment rod. The femur was sized to be a 6. Rotation of the referencing guide was checked with the epicondylar axis and Whitesides line. Then the 4-in-1 cutting jig was then applied to the distal femur. A stylus was used to confirm that the anterior femur would not be notched.   Then the anterior, posterior and chamfer femoral cuts were then made with an oscillating saw.  The knee was distracted and all posterior osteophytes were removed.  The flexion gap was then measured with a flexion spacer block and long alignment rod and was found to be symmetric with the extension gap and perpendicular to mechanical axis of the tibia.  The proximal tibia plateau was then sized with trial trays. The best coverage was achieved with a size 6. This tibial tray was then pinned into position. The proximal tibia was then prepared with the keel punch.  After tibial preparation was completed, all trial components were inserted with polyethylene trials. The knee achieved full extension and flexed to 120 degrees. Ligament were stable to varus and valgus at full extension as well as 30, 60 and 90 degrees of flexion.   The trials were then placed. Knee was taken through a full range of motion and deemed to be stable with the trial components. All trial components were then removed.  The joint was copiously irrigated with pulse lavage.  The final total knee arthroplasty components were then cemented into place. The knee was held in extension while cement was allowed to cure.The knee was taken through a range of motion and the patella tracked well and the knee was again irrigated copiously.  The knee capsule was then injected with Exparel.  The medial arthrotomy was closed with #1 Vicryl and #2 Quill.  The subcutaneous tissue closed with  2-0 vicryl, and skin approximated with staples.  A dry sterile and compressive dressing was applied.  A Polar Care was applied to the operative knee.  The patient was awakened and brought to the PACU in stable and satisfactory condition.  All sharp, lap and instrument counts were correct at the conclusion the case. I spoke with the patient's family in the postop consultation room to let them know the case had been performed without complication and the patient was stable in recovery room.   Total tourniquet time was 47 minutes.

## 2020-11-29 NOTE — Transfer of Care (Signed)
Immediate Anesthesia Transfer of Care Note  Patient: Grant Schmitt  Procedure(s) Performed: TOTAL KNEE ARTHROPLASTY (Left: Knee)  Patient Location: PACU  Anesthesia Type:General  Level of Consciousness: drowsy and patient cooperative  Airway & Oxygen Therapy: Patient Spontanous Breathing  Post-op Assessment: Report given to RN and Post -op Vital signs reviewed and stable  Post vital signs: Reviewed and stable  Last Vitals:  Vitals Value Taken Time  BP 90/58 11/29/20 1421  Temp    Pulse 69 11/29/20 1424  Resp 11 11/29/20 1424  SpO2 95 % 11/29/20 1424  Vitals shown include unvalidated device data.  Last Pain:  Vitals:   11/29/20 1039  TempSrc: Temporal  PainSc: 0-No pain         Complications: No notable events documented.

## 2020-11-30 ENCOUNTER — Encounter: Payer: Self-pay | Admitting: Orthopedic Surgery

## 2020-11-30 LAB — BASIC METABOLIC PANEL
Anion gap: 3 — ABNORMAL LOW (ref 5–15)
BUN: 27 mg/dL — ABNORMAL HIGH (ref 8–23)
CO2: 30 mmol/L (ref 22–32)
Calcium: 9.4 mg/dL (ref 8.9–10.3)
Chloride: 102 mmol/L (ref 98–111)
Creatinine, Ser: 1.31 mg/dL — ABNORMAL HIGH (ref 0.61–1.24)
GFR, Estimated: 52 mL/min — ABNORMAL LOW (ref >60)
Glucose, Bld: 111 mg/dL — ABNORMAL HIGH (ref 70–99)
Potassium: 4.5 mmol/L (ref 3.5–5.1)
Sodium: 135 mmol/L (ref 135–145)

## 2020-11-30 LAB — CBC
HCT: 34.7 % — ABNORMAL LOW (ref 39.0–52.0)
Hemoglobin: 12 g/dL — ABNORMAL LOW (ref 13.0–17.0)
MCH: 33 pg (ref 26.0–34.0)
MCHC: 34.6 g/dL (ref 30.0–36.0)
MCV: 95.3 fL (ref 80.0–100.0)
Platelets: 187 10*3/uL (ref 150–400)
RBC: 3.64 MIL/uL — ABNORMAL LOW (ref 4.22–5.81)
RDW: 13.2 % (ref 11.5–15.5)
WBC: 12.9 10*3/uL — ABNORMAL HIGH (ref 4.0–10.5)
nRBC: 0 % (ref 0.0–0.2)

## 2020-11-30 NOTE — Anesthesia Postprocedure Evaluation (Signed)
Anesthesia Post Note  Patient: Grant Schmitt  Procedure(s) Performed: TOTAL KNEE ARTHROPLASTY (Left: Knee)  Patient location during evaluation: Nursing Unit Anesthesia Type: Spinal Level of consciousness: oriented and awake and alert Pain management: pain level controlled Vital Signs Assessment: post-procedure vital signs reviewed and stable Respiratory status: spontaneous breathing and respiratory function stable Cardiovascular status: blood pressure returned to baseline and stable Postop Assessment: no headache, no backache, no apparent nausea or vomiting and patient able to bend at knees Anesthetic complications: no   No notable events documented.   Last Vitals:  Vitals:   11/30/20 0502 11/30/20 0754  BP: 129/76 132/72  Pulse: 73 72  Resp: 18 16  Temp: 37 C 36.8 C  SpO2: 98% 100%    Last Pain:  Vitals:   11/30/20 0754  TempSrc: Oral  PainSc:                  Alison Stalling

## 2020-11-30 NOTE — Evaluation (Signed)
Physical Therapy Evaluation Patient Details Name: Grant Schmitt MRN: [id number] DOB: 08-Jul-1928 Today's Date: 11/30/2020   History of Present Illness  Per MD, Pt is an 87 y.o. male who has a diagnosis of M17.12 Unilateral primary osteoarthritis and is s/p elective left total knee arthroplasty. PMH includes HTN, A-fib, pacemaker, aortic stenosis, vertigo, and cardiomyopathy.   Clinical Impression  Pt was pleasant and gave great effort during session. Patient's O2 and HR WNL throughout session. Pt L knee ROM 15-91 degrees. Pt performed transfers with min guard using RW with verbal cues for sequencing. Pt ambulated 128ft using RW with min guard assist requiring min verbal cues. Pt quickly transitioned from step-to pattern to step through gait pattern. HEP provided verbally and via handout with pt verbalizing understanding. Pt will benefit from HHPT upon discharge to safely address deficits listed in patient problem list for decreased caregiver assistance and eventual return to PLOF.     Follow Up Recommendations Home health PT;Supervision/Assistance - 24 hour    Equipment Recommendations  None recommended by PT    Recommendations for Other Services OT consult     Precautions / Restrictions Precautions Precautions: Fall Restrictions Weight Bearing Restrictions: Yes LLE Weight Bearing: Weight bearing as tolerated Other Position/Activity Restrictions: No pillow under knee of L LE.      Mobility  Bed Mobility Overal bed mobility: Modified Independent                  Transfers Overall transfer level: Needs assistance Equipment used: Rolling walker (2 wheeled) Transfers: Sit to/from Stand Sit to Stand: Min guard         General transfer comment: Verbal cue for transfer sequencing of hands and left foot.  Ambulation/Gait Ambulation/Gait assistance: Min guard Gait Distance (Feet): 150 Feet Assistive device: Rolling walker (2 wheeled) Gait Pattern/deviations: Step-to  pattern;Step-through pattern;Decreased step length - right;Decreased stance time - left     General Gait Details: Verbal cue for sequencing and 90 degree turns to avoid excessive twisting on L knee. Pt practiced walking forward/backward walking within room. Pt initially demonstrated step-to pattern and transitoned to step-through pattern shortly after leaving the room to walk in hallway.  Stairs            Wheelchair Mobility    Modified Rankin (Stroke Patients Only)       Balance Overall balance assessment: Needs assistance Sitting-balance support: No upper extremity supported Sitting balance-Leahy Scale: Good     Standing balance support: Bilateral upper extremity supported Standing balance-Leahy Scale: Poor                               Pertinent Vitals/Pain Pain Assessment: 0-10 Pain Score: 2  Pain Location: L knee Pain Descriptors / Indicators: Aching;Sore Pain Intervention(s): Limited activity within patient's tolerance;Monitored during session;Repositioned    Home Living Family/patient expects to be discharged to:: Private residence Living Arrangements: Alone Available Help at Discharge: Family;Other (Comment) (Son and daughter-in-law staying with patient for 24 hr assistance.) Type of Home: Independent living facility ([place] an [place] resident at [place]: Level entry     Home Layout: One [place]: Environmental consultant - 2 wheels;Walker - 4 wheels;Cane - single point      Prior Function Level of Independence: Independent         Comments: Pt is independent with ambulation, ADLs, and functional mobility.     Hand Dominance  Extremity/Trunk Assessment   Upper Extremity Assessment Upper Extremity Assessment: Overall WFL for tasks assessed    Lower Extremity Assessment Lower Extremity Assessment: LLE deficits/detail LLE Deficits / Details: s/p TKA expected strength/ROM Deficits LLE: Unable to fully assess due to  pain LLE Sensation: WNL       Communication   Communication: No difficulties  Cognition Arousal/Alertness: Awake/alert Behavior During Therapy: WFL for tasks assessed/performed Overall Cognitive Status: Within Functional Limits for tasks assessed                                        General Comments      Exercises Total Joint Exercises Quad Sets: AROM;Strengthening;Left;10 reps Straight Leg Raises: AROM;Strengthening;Left;10 reps Long Arc Quad: AROM;Strengthening;Left;10 reps Knee Flexion: AROM;Left;10 reps Goniometric ROM: L knee AROM 15-91 Marching in Standing: AROM;Both;10 reps Other Exercises Other Exercises: Positioning education provided to pt and son for left knee extension ROM and they verbalized understanding. Other Exercises: HEP education per handout and pt verbalized  and demonstrated understanding.   Assessment/Plan    PT Assessment Patient needs continued PT services  PT Problem List Decreased strength;Decreased range of motion;Decreased activity tolerance;Decreased balance;Decreased mobility;Pain       PT Treatment Interventions DME instruction;Gait training;Stair training;Functional mobility training;Therapeutic activities;Therapeutic exercise;Balance training;Patient/family education    PT Goals (Current goals can be found in the Care Plan section)  Acute Rehab PT Goals Patient Stated Goal: To walk better with less pain PT Goal Formulation: With patient Time For Goal Achievement: 12/13/20 Potential to Achieve Goals: Good    Frequency BID   Barriers to discharge        Co-evaluation               AM-PAC PT "6 Clicks" Mobility  Outcome Measure Help needed turning from your back to your side while in a flat bed without using bedrails?: A Little Help needed moving from lying on your back to sitting on the side of a flat bed without using bedrails?: A Little Help needed moving to and from a bed to a chair (including a  wheelchair)?: A Little Help needed standing up from a chair using your arms (e.g., wheelchair or bedside chair)?: A Little Help needed to walk in hospital room?: A Little Help needed climbing 3-5 steps with a railing? : A Little 6 Click Score: 18    End of Session Equipment Utilized During Treatment: Gait belt Activity Tolerance: Patient tolerated treatment well;No increased pain Patient left: in chair;with call bell/phone within reach;with chair alarm set;with family/visitor present;with SCD's reapplied Nurse Communication: Mobility status; bleeding at patient's incision site  PT Visit Diagnosis: Other abnormalities of gait and mobility (R26.89);Muscle weakness (generalized) (M62.81);Pain Pain - Right/Left: Left Pain - part of body: Knee    Time: 1761-6073 PT Time Calculation (min) (ACUTE ONLY): 39 min   Charges:              Dayton Scrape SPT 11/30/20, 1:46 PM

## 2020-11-30 NOTE — Progress Notes (Signed)
  Subjective:  Patient reports pain as mild.  No other complaints. His son is in the room.  Objective:   VITALS:   Vitals:   11/29/20 2032 11/30/20 0502 11/30/20 0754 11/30/20 1210  BP: 136/87 129/76 132/72 93/64  Pulse: 72 73 72 70  Resp: 18 18 16 16   Temp: 98.3 F (36.8 C) 98.6 F (37 C) 98.3 F (36.8 C) 97.6 F (36.4 C)  TempSrc: Oral  Oral Oral  SpO2: 100% 98% 100% 100%  Weight:      Height:        PHYSICAL EXAM:  ABD soft Neurovascular intact Dorsiflexion/Plantar flexion intact Incision: moderate drainage No cellulitis present Compartment soft  LABS  Results for orders placed or performed during the hospital encounter of 11/29/20 (from the past 24 hour(s))  CBC     Status: Abnormal   Collection Time: 11/30/20  4:18 AM  Result Value Ref Range   WBC 12.9 (H) 4.0 - 10.5 K/uL   RBC 3.64 (L) 4.22 - 5.81 MIL/uL   Hemoglobin 12.0 (L) 13.0 - 17.0 g/dL   HCT 34.7 (L) 39.0 - 52.0 %   MCV 95.3 80.0 - 100.0 fL   MCH 33.0 26.0 - 34.0 pg   MCHC 34.6 30.0 - 36.0 g/dL   RDW 13.2 11.5 - 15.5 %   Platelets 187 150 - 400 K/uL   nRBC 0.0 0.0 - 0.2 %  Basic metabolic panel     Status: Abnormal   Collection Time: 11/30/20  4:18 AM  Result Value Ref Range   Sodium 135 135 - 145 mmol/L   Potassium 4.5 3.5 - 5.1 mmol/L   Chloride 102 98 - 111 mmol/L   CO2 30 22 - 32 mmol/L   Glucose, Bld 111 (H) 70 - 99 mg/dL   BUN 27 (H) 8 - 23 mg/dL   Creatinine, Ser 1.31 (H) 0.61 - 1.24 mg/dL   Calcium 9.4 8.9 - 10.3 mg/dL   GFR, Estimated 52 (L) >60 mL/min   Anion gap 3 (L) 5 - 15    DG Knee Left Port  Result Date: 11/29/2020 CLINICAL DATA:  Status post knee replacement EXAM: PORTABLE LEFT KNEE - 1-2 VIEW COMPARISON:  None. FINDINGS: Left knee prosthesis is noted in satisfactory position. No acute fracture or dislocation is noted. No soft tissue abnormality is seen IMPRESSION: Status post left knee replacement Electronically Signed   By: Inez Catalina M.D.   On: 11/29/2020 14:51   [place]  OR NERVE BLOCK-IMAGE ONLY [facility])  Result Date: 11/29/2020 There is no interpretation for this exam.  This order is for images obtained during a surgical procedure.  Please See "Surgeries" Tab for more information regarding the procedure.    Assessment/Plan: 1 Day Post-Op   Active Problems:   S/P total knee replacement, left   Up with therapy Plan for discharge tomorrow Will hold tomorrow's dose of xarelto Change bandage in morning   Lovell Sheehan , MD 11/30/2020, 12:23 PM

## 2020-11-30 NOTE — Evaluation (Signed)
Occupational Therapy Evaluation Patient Details Name: Grant Schmitt MRN: [id number] DOB: 10-Apr-1929 Today's Date: 11/30/2020    History of Present Illness Per MD, Pt is an 87 y.o. male who is s/p L TKA on 11/29/20.   Clinical Impression   Pt seen for OT evaluation this date, POD#1 from above surgery. Pt was independent in all ADL prior to surgery and is eager to return to PLOF with less pain and improved safety and independence. Pt currently requires minimal assist for LB dressing while in seated position due to pain and limited AROM of L knee. Pt/family instructed in polar care mgt, falls prevention strategies, home/routines modifications, DME/AE for LB bathing and dressing tasks, and car transfer training. Handout provided to support recall and carryover. Pt would benefit from skilled OT services including additional instruction in dressing techniques with or without assistive devices for dressing and bathing skills to support recall and carryover prior to discharge and ultimately to maximize safety, independence, and minimize falls risk and caregiver burden. Do not currently anticipate any OT needs following this hospitalization.      Follow Up Recommendations  No OT follow up    Equipment Recommendations  3 in 1 bedside commode    Recommendations for Other Services       Precautions / Restrictions Precautions Precautions: Fall Restrictions Weight Bearing Restrictions: Yes LLE Weight Bearing: Weight bearing as tolerated Other Position/Activity Restrictions: No pillow under knee of L LE.      Mobility Bed Mobility              General bed mobility comments: NT up in recliner    Transfers Overall transfer level: Needs assistance Equipment used: Rolling walker (2 wheeled) Transfers: Sit to/from Stand Sit to Stand: Min guard         General transfer comment: Verbal cue for transfer sequencing.    Balance Overall balance assessment: Needs  assistance Sitting-balance support: No upper extremity supported Sitting balance-Leahy Scale: Good     Standing balance support: Bilateral upper extremity supported Standing balance-Leahy Scale: Poor                             ADL either performed or assessed with clinical judgement   ADL Overall ADL's : Needs assistance/impaired                                       General ADL Comments: PRN MIN A for LB ADL tasks, family able to assist with polar care, LB ADL as needed     Vision Baseline Vision/History: Wears glasses Wears Glasses: At all times Patient Visual Report: No change from baseline       Perception     Praxis      Pertinent Vitals/Pain Pain Assessment: 0-10 Pain Score: 2  Pain Location: L knee Pain Descriptors / Indicators: Aching;Sore Pain Intervention(s): Limited activity within patient's tolerance;Monitored during session;Premedicated before session;Repositioned;Ice applied     Hand Dominance     Extremity/Trunk Assessment Upper Extremity Assessment Upper Extremity Assessment: Overall WFL for tasks assessed   Lower Extremity Assessment Lower Extremity Assessment: LLE deficits/detail LLE Deficits / Details: s/p TKA expected strength/ROM Deficits       Communication Communication Communication: No difficulties   Cognition Arousal/Alertness: Awake/alert Behavior During Therapy: WFL for tasks assessed/performed Overall Cognitive Status: Within Functional Limits for tasks assessed  General Comments       Exercises  Other Exercises: Pt/family educated in polar care mgt, falls, AE/DME, home/routines modifications and car transfers   Shoulder Instructions      Home Living Family/patient expects to be discharged to:: Private residence Living Arrangements: Alone Available Help at Discharge: Family;Available 24 hours/day (Son and daughter-in-law staying with patient  for 24 hr assistance) Type of Home: Independent living facility Home Access: Level entry     Home Layout: One level     Bathroom Shower/Tub: Occupational psychologist: Handicapped height Bathroom Accessibility: Yes   Home Equipment: Environmental consultant - 2 wheels;Walker - 4 wheels;Cane - single point;Grab bars - toilet;Grab bars - tub/shower          Prior Functioning/Environment Level of Independence: Independent        Comments: Pt is independent with ambulation, ADLs, and functional mobility.        OT Problem List: Decreased strength;Decreased range of motion;Pain      OT Treatment/Interventions: Self-care/ADL training;Therapeutic exercise;Therapeutic activities;DME and/or AE instruction;Patient/family education;Balance training    OT Goals(Current goals can be found in the care plan section) Acute Rehab OT Goals Patient Stated Goal: To walk better with less pain OT Goal Formulation: With patient/family Time For Goal Achievement: 12/14/20 Potential to Achieve Goals: Good ADL Goals Pt Will Perform Lower Body Dressing: with modified independence;with adaptive equipment;sit to/from stand Pt Will Transfer to Toilet: with modified independence;ambulating (elevated commode, LRAD for amb) Additional ADL Goal #1: Pt will independently instruct family/caregiver in polar care mgt Additional ADL Goal #2: Pt will verbalize plan to implement at least 1 learned falls prevention strategy.  OT Frequency: Min 1X/week   Barriers to D/C:            Co-evaluation              AM-PAC OT "6 Clicks" Daily Activity     Outcome Measure Help from another person eating meals?: None Help from another person taking care of personal grooming?: None Help from another person toileting, which includes using toliet, bedpan, or urinal?: A Little Help from another person bathing (including washing, rinsing, drying)?: A Little Help from another person to put on and taking off regular upper  body clothing?: None Help from another person to put on and taking off regular lower body clothing?: A Little 6 Click Score: 21   End of Session    Activity Tolerance: Patient tolerated treatment well Patient left: in chair;with call bell/phone within reach;with chair alarm set;with family/visitor present  OT Visit Diagnosis: Other abnormalities of gait and mobility (R26.89);Pain Pain - Right/Left: Left Pain - part of body: Knee                Time: 3474-2595 OT Time Calculation (min): 13 min Charges:  OT General Charges $OT Visit: 1 Visit OT Evaluation $OT Eval Low Complexity: 1 Low OT Treatments $Self Care/Home Management : 8-22 mins  Hanley Hays, MPH, MS, OTR/L ascom [phone] 11/30/20, 12:12 PM

## 2020-11-30 NOTE — Progress Notes (Signed)
Physical Therapy Treatment Patient Details Name: Grant Schmitt MRN: [id number] DOB: 1929/06/17 Today's Date: 11/30/2020    History of Present Illness Per MD, Pt is an 87 y.o. male who has a diagnosis of M17.12 Unilateral primary osteoarthritis and is s/p elective left total knee arthroplasty. PMH includes HTN, A-fib, pacemaker, aortic stenosis, vertigo, and cardiomyopathy.     PT Comments    Pt was pleasant and motivated to participate during the session. Patient's SPO2 and HR were WNL throughout session on room air. Pt required no physical assistance throughout session, but needed min verbal cueing for transfers and mobility for sequencing and L LE placement. Patient demonstrated increased speed while ambulating and was steady without LOB. Pt is progressing towards goals. Pt will benefit from outpatient PT upon discharge to safely address deficits listed in patient problem list for decreased caregiver assistance and eventual return to PLOF.   Follow Up Recommendations  Supervision/Assistance - 24 hour;Outpatient PT     Equipment Recommendations  3in1 (PT)    Recommendations for Other Services       Precautions / Restrictions Precautions Precautions: Fall Restrictions Weight Bearing Restrictions: Yes LLE Weight Bearing: Weight bearing as tolerated    Mobility  Bed Mobility Overal bed mobility: Needs Assistance Bed Mobility: Rolling;Sit to Supine Rolling: Supervision     Sit to supine: Supervision   General bed mobility comments: Pt used rails to performing sit<>supine. Pt performed rolling in bed both directions using rails and verbal cues.    Transfers Overall transfer level: Needs assistance Equipment used: Rolling walker (2 wheeled) Transfers: Sit to/from Stand Sit to Stand: Min guard         General transfer comment: Verbal cues for transfer sequencing and L LE placement.  Ambulation/Gait Ambulation/Gait assistance: Min guard Gait Distance (Feet): 200  Feet Assistive device: Rolling walker (2 wheeled) Gait Pattern/deviations: Step-to pattern;Step-through pattern;Decreased step length - right;Decreased stance time - left     General Gait Details: Verbal cue for sequencing and 90 degree turns to avoid excessive twisting on L knee.  Pt initially demonstrated step-to pattern initially and quickly transitoned to step-through pattern.   Stairs             Wheelchair Mobility    Modified Rankin (Stroke Patients Only)       Balance Overall balance assessment: Needs assistance Sitting-balance support: No upper extremity supported Sitting balance-Leahy Scale: Good     Standing balance support: Bilateral upper extremity supported Standing balance-Leahy Scale: Fair Standing balance comment: Pt still needs support from RW to avoid LOB.                            Cognition Arousal/Alertness: Awake/alert Behavior During Therapy: WFL for tasks assessed/performed Overall Cognitive Status: Within Functional Limits for tasks assessed                                        Exercises Total Joint Exercises Quad Sets: AROM;Strengthening;Left;10 reps Straight Leg Raises: AROM;Strengthening;Left;10 reps Long Arc Quad: AROM;Strengthening;Left;10 reps Knee Flexion: AROM;Left;10 reps Other Exercises Other Exercises: Positioning education provided to pt and son and they verbalized understanding. Other Exercises: HEP education per handout and pt verbalized  and demonstrated understanding. Other Exercises: Car transfer sequencing education provided with pt and son.    General Comments        Pertinent Vitals/Pain Pain Assessment: 0-10  Pain Score: 4  Pain Location: L knee Pain Descriptors / Indicators: Aching;Sore Pain Intervention(s): Monitored during session;Repositioned;Ice applied    Home Living                      Prior Function            PT Goals (current goals can now be found in  the care plan section) Acute Rehab PT Goals Patient Stated Goal: To walk better with less pain Progress towards PT goals: Progressing toward goals    Frequency    BID      PT Plan Current plan remains appropriate    Co-evaluation              AM-PAC PT "6 Clicks" Mobility   Outcome Measure  Help needed turning from your back to your side while in a flat bed without using bedrails?: A Little Help needed moving from lying on your back to sitting on the side of a flat bed without using bedrails?: A Little Help needed moving to and from a bed to a chair (including a wheelchair)?: A Little Help needed standing up from a chair using your arms (e.g., wheelchair or bedside chair)?: A Little Help needed to walk in hospital room?: A Little Help needed climbing 3-5 steps with a railing? : A Little 6 Click Score: 18    End of Session Equipment Utilized During Treatment: Gait belt Activity Tolerance: Patient tolerated treatment well;No increased pain Patient left: in bed;with call bell/phone within reach;with bed alarm set;with family/visitor present;with SCD's reapplied; Polar care to L knee Nurse Communication: Mobility status PT Visit Diagnosis: Other abnormalities of gait and mobility (R26.89);Muscle weakness (generalized) (M62.81);Pain Pain - Right/Left: Left Pain - part of body: Knee     Time: 4818-5631 PT Time Calculation (min) (ACUTE ONLY): 39 min  Charges:                        Dayton Scrape SPT 11/30/20, 5:40 PM

## 2020-11-30 NOTE — TOC Progression Note (Addendum)
Transition of Care [facility]) - Progression Note    Patient Details  Name: Grant Schmitt MRN: [id number] Date of Birth: 1929-05-09  Transition of [facility]) CM/SW [place], RN Phone Number: 11/30/2020, 1:41 PM  Clinical Narrative:     The patient has support at home with his family, He is already set up with Out patient PT and has an appointment, his son provides transportation, he can afford his medications, he needs a 3 in 1 that will be brought into the room by [employer] for him to take home       Expected Discharge Plan and Services                                                 Social Determinants of Health (SDOH) Interventions    Readmission Risk Interventions No flowsheet data found.

## 2020-12-01 LAB — CBC
HCT: 33.3 % — ABNORMAL LOW (ref 39.0–52.0)
Hemoglobin: 11.1 g/dL — ABNORMAL LOW (ref 13.0–17.0)
MCH: 32.6 pg (ref 26.0–34.0)
MCHC: 33.3 g/dL (ref 30.0–36.0)
MCV: 97.7 fL (ref 80.0–100.0)
Platelets: 172 10*3/uL (ref 150–400)
RBC: 3.41 MIL/uL — ABNORMAL LOW (ref 4.22–5.81)
RDW: 13.3 % (ref 11.5–15.5)
WBC: 12.4 10*3/uL — ABNORMAL HIGH (ref 4.0–10.5)
nRBC: 0 % (ref 0.0–0.2)

## 2020-12-01 LAB — ABO/RH: ABO/RH(D): O POS

## 2020-12-01 MED ORDER — HYDROCODONE-ACETAMINOPHEN 5-325 MG PO TABS: 1.0000 | ORAL_TABLET | ORAL | 0 refills | Status: DC | PRN

## 2020-12-01 MED ORDER — DOCUSATE SODIUM 100 MG PO CAPS: 100.0000 mg | ORAL_CAPSULE | Freq: Two times a day (BID) | ORAL | 0 refills | Status: AC

## 2020-12-01 MED ORDER — METHOCARBAMOL 500 MG PO TABS: 500.0000 mg | ORAL_TABLET | Freq: Three times a day (TID) | ORAL | 0 refills | Status: DC | PRN

## 2020-12-01 NOTE — Progress Notes (Signed)
  Subjective:  Patient reports pain as mild to moderate.  Bleeding from chronic blood thinner.  Objective:   VITALS:   Vitals:   11/30/20 1626 11/30/20 1940 12/01/20 0021 12/01/20 0347  BP: 113/66 (!) 142/85 112/76 120/88  Pulse:  73 72 72  Resp: $Remo'16 19 18   'LAgfz$ Temp: (!) 97.5 F (36.4 C) 98.4 F (36.9 C) 98.2 F (36.8 C) 98.7 F (37.1 C)  TempSrc: Oral  Oral   SpO2:  99% 98% 99%  Weight:      Height:        PHYSICAL EXAM:  Neurologically intact ABD soft Neurovascular intact Sensation intact distally Intact pulses distally Dorsiflexion/Plantar flexion intact Incision: moderate drainage and drsg changed No cellulitis present Compartment soft  LABS  Results for orders placed or performed during the hospital encounter of 11/29/20 (from the past 24 hour(s))  CBC     Status: Abnormal   Collection Time: 12/01/20  4:16 AM  Result Value Ref Range   WBC 12.4 (H) 4.0 - 10.5 K/uL   RBC 3.41 (L) 4.22 - 5.81 MIL/uL   Hemoglobin 11.1 (L) 13.0 - 17.0 g/dL   HCT 33.3 (L) 39.0 - 52.0 %   MCV 97.7 80.0 - 100.0 fL   MCH 32.6 26.0 - 34.0 pg   MCHC 33.3 30.0 - 36.0 g/dL   RDW 13.3 11.5 - 15.5 %   Platelets 172 150 - 400 K/uL   nRBC 0.0 0.0 - 0.2 %    DG Knee Left Port  Result Date: 11/29/2020 CLINICAL DATA:  Status post knee replacement EXAM: PORTABLE LEFT KNEE - 1-2 VIEW COMPARISON:  None. FINDINGS: Left knee prosthesis is noted in satisfactory position. No acute fracture or dislocation is noted. No soft tissue abnormality is seen IMPRESSION: Status post left knee replacement Electronically Signed   By: Inez Catalina M.D.   On: 11/29/2020 14:51   [place] OR NERVE BLOCK-IMAGE ONLY [facility])  Result Date: 11/29/2020 There is no interpretation for this exam.  This order is for images obtained during a surgical procedure.  Please See "Surgeries" Tab for more information regarding the procedure.    Assessment/Plan: 2 Days Post-Op   Active Problems:   S/P total knee replacement,  left   Advance diet Up with therapy WBAT LLE  Discharge today after PT goals met   [name] , PA-C 12/01/2020, 6:59 AM

## 2020-12-01 NOTE — Discharge Instructions (Signed)
Continue weight bear as tolerated on the left lower extremity.    Elevate the left lower extremity whenever possible and continue the polar care while elevating the extremity. Patient may shower. No bath or submerging the wound.    Take Xarelto as directed for blood clot prevention.  Continue to work on knee range of motion exercises at home as instructed by physical therapy. Continue to use a walker for assistance with ambulation until cleared by physical therapy.  Call [phone] with any questions, such as fever > 101.5 degrees, drainage from the wound or shortness of breath.  

## 2020-12-01 NOTE — Progress Notes (Signed)
Physical Therapy Treatment Patient Details Name: Grant Schmitt MRN: [id number] DOB: 06/30/1928 Today's Date: 12/01/2020    History of Present Illness Per MD, Pt is an 87 y.o. male who has a diagnosis of M17.12 Unilateral primary osteoarthritis and is s/p elective left total knee arthroplasty. PMH includes HTN, A-fib, pacemaker, aortic stenosis, vertigo, and cardiomyopathy.    PT Comments    Pt was pleasant and motivated to participate during the session. Pt's SPO2 and HR WNL throughout session on room air. Pt required no physical assistance throughout session but require extra time and breaks secondary to increased pain reported as 6/10. Pt initially unsteady secondary to pain but reported relief after ambulating and was encouraged to perform there ex on his own or walk with nurse every 1-2 hours. Pt will attempt stair training next session if pain appropriate. Pt is progressing towards goals. Pt will benefit from outpatient PT upon discharge to safely address deficits listed in patient problem list for decreased caregiver assistance and eventual return to PLOF.   Follow Up Recommendations  Supervision/Assistance - 24 hour;Outpatient PT     Equipment Recommendations  3in1 (PT)    Recommendations for Other Services       Precautions / Restrictions Precautions Precautions: Fall Restrictions Weight Bearing Restrictions: Yes LLE Weight Bearing: Weight bearing as tolerated    Mobility  Bed Mobility Overal bed mobility: Needs Assistance Bed Mobility: Supine to Sit     Supine to sit: Supervision     General bed mobility comments: Pt used rails to performing supine<>sit.    Transfers Overall transfer level: Needs assistance Equipment used: Rolling walker (2 wheeled) Transfers: Sit to/from Stand Sit to Stand: Min guard         General transfer comment: Verbal cues for transfer sequencing and L LE placement.  Ambulation/Gait Ambulation/Gait assistance: Min guard Gait  Distance (Feet): 200 Feet Assistive device: Rolling walker (2 wheeled) Gait Pattern/deviations: Step-through pattern;Decreased step length - right;Decreased stance time - left Gait velocity: decreased       Stairs             Wheelchair Mobility    Modified Rankin (Stroke Patients Only)       Balance Overall balance assessment: Needs assistance Sitting-balance support: No upper extremity supported Sitting balance-Leahy Scale: Good     Standing balance support: Bilateral upper extremity supported Standing balance-Leahy Scale: Fair Standing balance comment: Pt still needs support from RW to avoid LOB.                            Cognition Arousal/Alertness: Awake/alert Behavior During Therapy: WFL for tasks assessed/performed Overall Cognitive Status: Within Functional Limits for tasks assessed                                        Exercises Total Joint Exercises Quad Sets: AROM;Strengthening;Left;10 reps;Supine Long Arc Quad: AROM;Strengthening;Left;10 reps;Seated Knee Flexion: AROM;Left;10 reps;Seated Goniometric ROM: L knee AROM 13-88 Marching in Standing: AROM;Both;10 reps;Standing    General Comments        Pertinent Vitals/Pain Pain Assessment: 0-10 Pain Score: 6  Pain Location: L knee Pain Descriptors / Indicators: Aching;Sore Pain Intervention(s): Monitored during session;RN gave pain meds during session;Repositioned;Ice applied    Home Living                      Prior  Function            PT Goals (current goals can now be found in the care plan section) Progress towards PT goals: Progressing toward goals    Frequency    BID      PT Plan Current plan remains appropriate    Co-evaluation              AM-PAC PT "6 Clicks" Mobility   Outcome Measure  Help needed turning from your back to your side while in a flat bed without using bedrails?: A Little Help needed moving from lying on your  back to sitting on the side of a flat bed without using bedrails?: A Little Help needed moving to and from a bed to a chair (including a wheelchair)?: A Little Help needed standing up from a chair using your arms (e.g., wheelchair or bedside chair)?: A Little Help needed to walk in hospital room?: A Little Help needed climbing 3-5 steps with a railing? : A Little 6 Click Score: 18    End of Session Equipment Utilized During Treatment: Gait belt Activity Tolerance: Patient tolerated treatment well;Patient limited by pain Patient left: in chair;with call bell/phone within reach;with chair alarm set;with SCD's reapplied;Other (comment) (Polar Care applied to L LE.)Pt reported increased pain due to new wrap being too tight and requested pain medication that was administered by nurse during session.  Nurse Communication: Mobility status PT Visit Diagnosis: Other abnormalities of gait and mobility (R26.89);Muscle weakness (generalized) (M62.81);Pain Pain - Right/Left: Left Pain - part of body: Knee     Time: 8676-7209 PT Time Calculation (min) (ACUTE ONLY): 48 min  Charges:                        Dayton Scrape SPT 12/01/20, 1:18 PM

## 2020-12-01 NOTE — Progress Notes (Signed)
Discharge Note: Reviewed discharge instructions with pt. Pt verbalized understanding. IV cath intact upon removal. PT discharged with personal belongings, polar care, compression stockings. Pt wheeled out by staff. Pt transported to home via family private vehicle.

## 2020-12-01 NOTE — Discharge Summary (Signed)
Physician Discharge Summary  Patient ID: Grant Schmitt MRN: [id number] DOB/AGE: [id number] 85 y.o.  Admit date: 11/29/2020 Discharge date: 12/01/2020  Admission Diagnoses:  M17.12 Unilateral primary osteoarthritis, left knee <principal problem not specified>  Discharge Diagnoses:  M17.12 Unilateral primary osteoarthritis, left knee Active Problems:   S/P total knee replacement, left   Past Medical History:  Diagnosis Date   A-fib ([place])    Allergy    Aortic insufficiency with aortic stenosis    Arthritis    Cardiomyopathy, secondary ([place])    Chronic anticoagulation    Rivaroxaban   GERD (gastroesophageal reflux disease)    occ-tums prn   Gout    H/O hemorrhoidectomy 1984   History of chickenpox    Hyperlipidemia    Hypertension    Pacemaker    Sick sinus syndrome ([place])    Skin cancer    Sleep apnea    does not use cpap    Tricuspid regurgitation    Vertigo     Surgeries: Procedure(s): TOTAL KNEE ARTHROPLASTY on 11/29/2020   Consultants (if any):   Discharged Condition: Improved  Hospital Course: Caison Hearn is an 87 y.o. male who was admitted 11/29/2020 with a diagnosis of  M17.12 Unilateral primary osteoarthritis, left knee <principal problem not specified> and went to the operating room on 11/29/2020 and underwent the above named procedures.    He was given perioperative antibiotics:  Anti-infectives (From admission, onward)    Start     Dose/Rate Route Frequency Ordered Stop   11/29/20 1800  ceFAZolin (ANCEF) IVPB 2g/100 mL premix        2 g 200 mL/hr over 30 Minutes Intravenous Every 6 hours 11/29/20 1609 11/29/20 2355   11/29/20 1027  ceFAZolin (ANCEF) 2-4 GM/100ML-% IVPB       Note to Pharmacy: Herby Abraham   : cabinet override      11/29/20 1027 11/29/20 1812   11/29/20 0600  ceFAZolin (ANCEF) IVPB 2g/100 mL premix        2 g 200 mL/hr over 30 Minutes Intravenous On call to O.R. 11/28/20 2253 11/29/20 1241     .  He was given sequential  compression devices, early ambulation, and Xarelto for DVT prophylaxis.  He benefited maximally from the hospital stay and there were no complications.    Recent vital signs:  Vitals:   12/01/20 0021 12/01/20 0347  BP: 112/76 120/88  Pulse: 72 72  Resp: 18   Temp: 98.2 F (36.8 C) 98.7 F (37.1 C)  SpO2: 98% 99%    Recent laboratory studies:  Lab Results  Component Value Date   HGB 11.1 (L) 12/01/2020   HGB 12.0 (L) 11/30/2020   HGB 13.1 11/19/2020   Lab Results  Component Value Date   WBC 12.4 (H) 12/01/2020   PLT 172 12/01/2020   Lab Results  Component Value Date   INR 2.1 (H) 11/19/2020   Lab Results  Component Value Date   NA 135 11/30/2020   K 4.5 11/30/2020   CL 102 11/30/2020   CO2 30 11/30/2020   BUN 27 (H) 11/30/2020   CREATININE 1.31 (H) 11/30/2020   GLUCOSE 111 (H) 11/30/2020    Discharge Medications:   Allergies as of 12/01/2020   No Known Allergies      Medication List     TAKE these medications    calcium carbonate 500 MG chewable tablet Commonly known as: TUMS - dosed in mg elemental calcium Chew 1 tablet by mouth as needed for indigestion or  heartburn.   docusate sodium 100 MG capsule Commonly known as: COLACE Take 1 capsule (100 mg total) by mouth 2 (two) times daily.   HYDROcodone-acetaminophen 5-325 MG tablet Commonly known as: NORCO/VICODIN Take 1 tablet by mouth every 4 (four) hours as needed for moderate pain (pain score 4-6).   losartan 50 MG tablet Commonly known as: COZAAR Take 50 mg by mouth every morning.   methocarbamol 500 MG tablet Commonly known as: Robaxin Take 1 tablet (500 mg total) by mouth every 8 (eight) hours as needed for muscle spasms.   vitamin C 1000 MG tablet Take 1,000 mg by mouth daily.   Vitamin D3 50 MCG (2000 UT) Tabs Take 2,000 Units by mouth daily.   Xarelto 20 MG Tabs tablet Generic drug: rivaroxaban Take 1 tablet (20 mg total) by mouth daily. What changed: when to take this        ASK your doctor about these medications    allopurinol 300 MG tablet Commonly known as: ZYLOPRIM TAKE 1 TABLET BY MOUTH  DAILY   clotrimazole-betamethasone lotion Commonly known as: LOTRISONE APPLY  LOTION TOPICALLY TWICE DAILY   metoprolol tartrate 50 MG tablet Commonly known as: LOPRESSOR TAKE 1 TABLET BY MOUTH  DAILY   simvastatin 20 MG tablet Commonly known as: ZOCOR TAKE 1 TABLET BY MOUTH  DAILY   tamsulosin 0.4 MG Caps capsule Commonly known as: FLOMAX TAKE 1 CAPSULE BY MOUTH  DAILY   triamcinolone cream 0.1 % Commonly known as: KENALOG Apply 1 application topically 2 (two) times daily.               Durable Medical Equipment  (From admission, onward)           Start     Ordered   12/01/20 0706  For home use only DME 3 n 1  Once        12/01/20 0705   12/01/20 0706  For home use only DME Walker rolling  Once       Question Answer Comment  Walker: With 5 Inch Wheels   Patient needs a walker to treat with the following condition Osteoarthritis of left knee      12/01/20 0705            Diagnostic Studies: DG Knee Left Port  Result Date: 11/29/2020 CLINICAL DATA:  Status post knee replacement EXAM: PORTABLE LEFT KNEE - 1-2 VIEW COMPARISON:  None. FINDINGS: Left knee prosthesis is noted in satisfactory position. No acute fracture or dislocation is noted. No soft tissue abnormality is seen IMPRESSION: Status post left knee replacement Electronically Signed   By: Inez Catalina M.D.   On: 11/29/2020 14:51   [place] OR NERVE BLOCK-IMAGE ONLY [facility])  Result Date: 11/29/2020 There is no interpretation for this exam.  This order is for images obtained during a surgical procedure.  Please See "Surgeries" Tab for more information regarding the procedure.    Disposition: Discharge disposition: 01-Home or Self Care            Signed: [name] ,PA-C 12/01/2020, 7:06 AM

## 2020-12-07 DIAGNOSIS — R262 Difficulty in walking, not elsewhere classified: Secondary | ICD-10-CM | POA: Diagnosis not present

## 2020-12-07 DIAGNOSIS — M25562 Pain in left knee: Secondary | ICD-10-CM | POA: Diagnosis not present

## 2020-12-07 DIAGNOSIS — M6281 Muscle weakness (generalized): Secondary | ICD-10-CM | POA: Diagnosis not present

## 2020-12-07 DIAGNOSIS — Z471 Aftercare following joint replacement surgery: Secondary | ICD-10-CM | POA: Diagnosis not present

## 2020-12-09 DIAGNOSIS — R262 Difficulty in walking, not elsewhere classified: Secondary | ICD-10-CM | POA: Diagnosis not present

## 2020-12-09 DIAGNOSIS — Z471 Aftercare following joint replacement surgery: Secondary | ICD-10-CM | POA: Diagnosis not present

## 2020-12-09 DIAGNOSIS — M25562 Pain in left knee: Secondary | ICD-10-CM | POA: Diagnosis not present

## 2020-12-09 DIAGNOSIS — M6281 Muscle weakness (generalized): Secondary | ICD-10-CM | POA: Diagnosis not present

## 2020-12-10 DIAGNOSIS — Z09 Encounter for follow-up examination after completed treatment for conditions other than malignant neoplasm: Secondary | ICD-10-CM | POA: Diagnosis not present

## 2020-12-13 DIAGNOSIS — R262 Difficulty in walking, not elsewhere classified: Secondary | ICD-10-CM | POA: Diagnosis not present

## 2020-12-13 DIAGNOSIS — M6281 Muscle weakness (generalized): Secondary | ICD-10-CM | POA: Diagnosis not present

## 2020-12-13 DIAGNOSIS — M25562 Pain in left knee: Secondary | ICD-10-CM | POA: Diagnosis not present

## 2020-12-13 DIAGNOSIS — Z471 Aftercare following joint replacement surgery: Secondary | ICD-10-CM | POA: Diagnosis not present

## 2020-12-15 DIAGNOSIS — Z471 Aftercare following joint replacement surgery: Secondary | ICD-10-CM | POA: Diagnosis not present

## 2020-12-15 DIAGNOSIS — R262 Difficulty in walking, not elsewhere classified: Secondary | ICD-10-CM | POA: Diagnosis not present

## 2020-12-15 DIAGNOSIS — M6281 Muscle weakness (generalized): Secondary | ICD-10-CM | POA: Diagnosis not present

## 2020-12-15 DIAGNOSIS — M25562 Pain in left knee: Secondary | ICD-10-CM | POA: Diagnosis not present

## 2020-12-17 DIAGNOSIS — Z471 Aftercare following joint replacement surgery: Secondary | ICD-10-CM | POA: Diagnosis not present

## 2020-12-17 DIAGNOSIS — M6281 Muscle weakness (generalized): Secondary | ICD-10-CM | POA: Diagnosis not present

## 2020-12-17 DIAGNOSIS — M25562 Pain in left knee: Secondary | ICD-10-CM | POA: Diagnosis not present

## 2020-12-17 DIAGNOSIS — R262 Difficulty in walking, not elsewhere classified: Secondary | ICD-10-CM | POA: Diagnosis not present

## 2020-12-22 DIAGNOSIS — M25562 Pain in left knee: Secondary | ICD-10-CM | POA: Diagnosis not present

## 2020-12-22 DIAGNOSIS — Z471 Aftercare following joint replacement surgery: Secondary | ICD-10-CM | POA: Diagnosis not present

## 2020-12-22 DIAGNOSIS — M6281 Muscle weakness (generalized): Secondary | ICD-10-CM | POA: Diagnosis not present

## 2020-12-22 DIAGNOSIS — R262 Difficulty in walking, not elsewhere classified: Secondary | ICD-10-CM | POA: Diagnosis not present

## 2020-12-24 DIAGNOSIS — M25562 Pain in left knee: Secondary | ICD-10-CM | POA: Diagnosis not present

## 2020-12-24 DIAGNOSIS — Z471 Aftercare following joint replacement surgery: Secondary | ICD-10-CM | POA: Diagnosis not present

## 2020-12-24 DIAGNOSIS — R262 Difficulty in walking, not elsewhere classified: Secondary | ICD-10-CM | POA: Diagnosis not present

## 2020-12-24 DIAGNOSIS — M6281 Muscle weakness (generalized): Secondary | ICD-10-CM | POA: Diagnosis not present

## 2020-12-27 DIAGNOSIS — Z471 Aftercare following joint replacement surgery: Secondary | ICD-10-CM | POA: Diagnosis not present

## 2020-12-27 DIAGNOSIS — M6281 Muscle weakness (generalized): Secondary | ICD-10-CM | POA: Diagnosis not present

## 2020-12-27 DIAGNOSIS — R262 Difficulty in walking, not elsewhere classified: Secondary | ICD-10-CM | POA: Diagnosis not present

## 2020-12-27 DIAGNOSIS — M25562 Pain in left knee: Secondary | ICD-10-CM | POA: Diagnosis not present

## 2020-12-31 DIAGNOSIS — Z471 Aftercare following joint replacement surgery: Secondary | ICD-10-CM | POA: Diagnosis not present

## 2020-12-31 DIAGNOSIS — M25562 Pain in left knee: Secondary | ICD-10-CM | POA: Diagnosis not present

## 2020-12-31 DIAGNOSIS — R262 Difficulty in walking, not elsewhere classified: Secondary | ICD-10-CM | POA: Diagnosis not present

## 2020-12-31 DIAGNOSIS — M6281 Muscle weakness (generalized): Secondary | ICD-10-CM | POA: Diagnosis not present

## 2021-01-05 DIAGNOSIS — M25562 Pain in left knee: Secondary | ICD-10-CM | POA: Diagnosis not present

## 2021-01-05 DIAGNOSIS — Z471 Aftercare following joint replacement surgery: Secondary | ICD-10-CM | POA: Diagnosis not present

## 2021-01-05 DIAGNOSIS — R262 Difficulty in walking, not elsewhere classified: Secondary | ICD-10-CM | POA: Diagnosis not present

## 2021-01-05 DIAGNOSIS — M6281 Muscle weakness (generalized): Secondary | ICD-10-CM | POA: Diagnosis not present

## 2021-01-07 DIAGNOSIS — R262 Difficulty in walking, not elsewhere classified: Secondary | ICD-10-CM | POA: Diagnosis not present

## 2021-01-07 DIAGNOSIS — M25562 Pain in left knee: Secondary | ICD-10-CM | POA: Diagnosis not present

## 2021-01-07 DIAGNOSIS — Z471 Aftercare following joint replacement surgery: Secondary | ICD-10-CM | POA: Diagnosis not present

## 2021-01-07 DIAGNOSIS — M6281 Muscle weakness (generalized): Secondary | ICD-10-CM | POA: Diagnosis not present

## 2021-01-10 ENCOUNTER — Telehealth: Payer: Self-pay | Admitting: Family Medicine

## 2021-01-10 NOTE — Telephone Encounter (Signed)
Copied from [place] [phone]. Topic: Medicare AWV >> Jan 10, 2021  9:55 AM Cher Nakai R wrote: Reason for CRM:   No answer unable to leave a message to cancel AWV on Jan 17, 2021 due to [facility] not available - we will call back to reschedule-srs

## 2021-01-21 DIAGNOSIS — R262 Difficulty in walking, not elsewhere classified: Secondary | ICD-10-CM | POA: Diagnosis not present

## 2021-01-21 DIAGNOSIS — Z471 Aftercare following joint replacement surgery: Secondary | ICD-10-CM | POA: Diagnosis not present

## 2021-01-21 DIAGNOSIS — M25562 Pain in left knee: Secondary | ICD-10-CM | POA: Diagnosis not present

## 2021-01-21 DIAGNOSIS — M6281 Muscle weakness (generalized): Secondary | ICD-10-CM | POA: Diagnosis not present

## 2021-01-25 DIAGNOSIS — I495 Sick sinus syndrome: Secondary | ICD-10-CM | POA: Diagnosis not present

## 2021-02-03 ENCOUNTER — Other Ambulatory Visit: Payer: Self-pay | Admitting: Family Medicine

## 2021-02-03 DIAGNOSIS — Z8739 Personal history of other diseases of the musculoskeletal system and connective tissue: Secondary | ICD-10-CM

## 2021-02-03 DIAGNOSIS — N400 Enlarged prostate without lower urinary tract symptoms: Secondary | ICD-10-CM

## 2021-02-03 DIAGNOSIS — E7800 Pure hypercholesterolemia, unspecified: Secondary | ICD-10-CM

## 2021-03-12 ENCOUNTER — Ambulatory Visit (INDEPENDENT_AMBULATORY_CARE_PROVIDER_SITE_OTHER): Payer: Medicare Other

## 2021-03-12 ENCOUNTER — Other Ambulatory Visit: Payer: Self-pay

## 2021-03-12 DIAGNOSIS — Z0000 Encounter for general adult medical examination without abnormal findings: Secondary | ICD-10-CM

## 2021-03-12 NOTE — Progress Notes (Signed)
Subjective:   Grant Schmitt is a 87 y.o. male who presents for an Initial Medicare Annual Wellness Visit.  I connected with  Grant Schmitt on 03/14/21 by a audio enabled telemedicine application and verified that I am speaking with the correct person using two identifiers.   Location of patient: Home Location of provider: Office  Persons participating in visit Grant Schmitt (patient) Loralyn Freshwater RMA   I discussed the limitations of evaluation and management by telemedicine. The patient expressed understanding and agreed to proceed.    Review of Systems    Defer to PCP       Objective:    There were no vitals filed for this visit. There is no height or weight on file to calculate BMI.  Advanced Directives 03/12/2021 11/29/2020 11/29/2020 11/19/2020 11/25/2019 11/20/2018 06/25/2018  Does Patient Have a Medical Advance Directive? Yes Yes Yes Yes Yes Yes Yes  Type of Advance Directive Living will;Out of facility DNR (pink MOST or yellow form) Living will Living will - [place];Living will [place];Living will [place];Living will  Does patient want to make changes to medical advance directive? - Yes (Inpatient - patient requests chaplain consult to change a medical advance directive) No - Patient declined - - - No - Patient declined  Copy of [place] in Chart? Yes - validated most recent copy scanned in chart (See row information) - - - Yes - validated most recent copy scanned in chart (See row information) Yes - validated most recent copy scanned in chart (See row information) Yes - validated most recent copy scanned in chart (See row information)    Current Medications (verified) Outpatient Encounter Medications as of 03/12/2021  Medication Sig   allopurinol (ZYLOPRIM) 300 MG tablet TAKE 1 TABLET BY MOUTH  DAILY   Ascorbic Acid (VITAMIN C) 1000 MG tablet Take 1,000 mg by mouth daily.   calcium carbonate  (TUMS - DOSED IN MG ELEMENTAL CALCIUM) 500 MG chewable tablet Chew 1 tablet by mouth as needed for indigestion or heartburn.   Cholecalciferol (VITAMIN D3) 2000 units TABS Take 2,000 Units by mouth daily.    clotrimazole-betamethasone (LOTRISONE) lotion APPLY  LOTION TOPICALLY TWICE DAILY (Patient taking differently: Apply 1 application topically 2 (two) times daily. To left arm rash)   losartan (COZAAR) 50 MG tablet Take 50 mg by mouth every morning.   metoprolol tartrate (LOPRESSOR) 50 MG tablet TAKE 1 TABLET BY MOUTH  DAILY (Patient taking differently: Take 50 mg by mouth every morning.)   simvastatin (ZOCOR) 20 MG tablet TAKE 1 TABLET BY MOUTH  DAILY   tamsulosin (FLOMAX) 0.4 MG CAPS capsule TAKE 1 CAPSULE BY MOUTH  DAILY   XARELTO 20 MG TABS tablet Take 1 tablet (20 mg total) by mouth daily.   [DISCONTINUED] HYDROcodone-acetaminophen (NORCO/VICODIN) 5-325 MG tablet Take 1 tablet by mouth every 4 (four) hours as needed for moderate pain (pain score 4-6).   [DISCONTINUED] methocarbamol (ROBAXIN) 500 MG tablet Take 1 tablet (500 mg total) by mouth every 8 (eight) hours as needed for muscle spasms.   [DISCONTINUED] triamcinolone cream (KENALOG) 0.1 % Apply 1 application topically 2 (two) times daily.   docusate sodium (COLACE) 100 MG capsule Take 1 capsule (100 mg total) by mouth 2 (two) times daily. (Patient not taking: Reported on 03/12/2021)   No facility-administered encounter medications on file as of 03/12/2021.    Allergies (verified) Patient has no known allergies.   History: Past Medical  History:  Diagnosis Date   A-fib ([place])    Allergy    Aortic insufficiency with aortic stenosis    Arthritis    Cardiomyopathy, secondary ([place])    Chronic anticoagulation    Rivaroxaban   GERD (gastroesophageal reflux disease)    occ-tums prn   Gout    H/O hemorrhoidectomy 1984   History of chickenpox    Hyperlipidemia    Hypertension    Pacemaker    Sick sinus syndrome ([place])    Skin  cancer    Sleep apnea    does not use cpap    Tricuspid regurgitation    Vertigo    Past Surgical History:  Procedure Laterality Date   CATARACT EXTRACTION W/ INTRAOCULAR LENS IMPLANT Left    COLONOSCOPY     COLONOSCOPY W/ POLYPECTOMY     EYE SURGERY     hemorrhoidectomy  1984   IMPLANTABLE CARDIOVERTER DEFIBRILLATOR (ICD) GENERATOR CHANGE Left 06/26/2018   Procedure: SINGLE CHAMBER BATTERY CHANGEOUT;  Surgeon: Isaias Cowman, MD;  Location: [facility];  Service: Cardiovascular;  Laterality: Left;   INSERT / REPLACE / REMOVE PACEMAKER  2012   MOHS SURGERY Right 05/14/2018   ear. Dr. Karin Golden [facility]   REPLACEMENT TOTAL KNEE Right 2005   TOTAL KNEE ARTHROPLASTY Left 11/29/2020   Procedure: TOTAL KNEE ARTHROPLASTY;  Surgeon: Lovell Sheehan, MD;  Location: [facility];  Service: Orthopedics;  Laterality: Left;   History reviewed. No pertinent family history. Social History   Socioeconomic History   Marital status: Widowed    Spouse name: Not on file   Number of children: 2   Years of education: 2   Highest education level: 12th grade  Occupational History   Occupation: Retired  Tobacco Use   Smoking status: Former    Packs/day: 1.00    Years: 20.00    Pack years: 20.00    Types: Cigarettes    Quit date: 06/19/1958    Years since quitting: 62.7   Smokeless tobacco: Never  Vaping Use   Vaping Use: Never used  Substance and Sexual Activity   Alcohol use: Yes    Alcohol/week: 0.0 - 1.0 standard drinks   Drug use: No    Comment: occ beer   Sexual activity: Not on file  Other Topics Concern   Not on file  Social History Narrative   Lives at [place] Strain: Low Risk    Difficulty of Paying Living Expenses: Not hard at all  Food Insecurity: Not on file  Transportation Needs: No Transportation Needs   Lack of Transportation (Medical): No   Lack of Transportation (Non-Medical): No  Physical Activity:  Insufficiently Active   Days of Exercise per Week: 3 days   Minutes of Exercise per Session: 20 min  Stress: Not on file  Social Connections: Unknown   Frequency of Communication with Friends and Family: Twice a week   Frequency of Social Gatherings with Friends and Family: Twice a week   Attends Religious Services: Never   Marine scientist or Organizations: No   Attends Music therapist: Never   Marital Status: Not on file    Tobacco Counseling Counseling given: Not Answered   Clinical Intake:     Pain : No/denies pain     Nutritional Status: BMI of 19-24  Normal Nutritional Risks: None Diabetes: No  How often do you need to have someone help you when you  read instructions, pamphlets, or other written materials from your doctor or pharmacy?: 1 - Never  Diabetic?No  Interpreter Needed?: No      Activities of Daily Living In your present state of health, do you have any difficulty performing the following activities: 11/29/2020 11/29/2020  Hearing? - Y  Vision? - Y  Difficulty concentrating or making decisions? - Y  Walking or climbing stairs? - Y  Dressing or bathing? - Y  Doing errands, shopping? N -  Some recent data might be hidden    Patient Care Team: Birdie Sons, MD as PCP - General (Family Medicine) Ubaldo Glassing Javier Docker, MD as Consulting Physician (Cardiology) Dingeldein, Remo Lipps, MD as Consulting Physician (Ophthalmology) Chrismon, Vickki Muff, PA-C as Consulting Physician (Family Medicine) Will Bonnet, MD as Referring Physician (Internal Medicine) Lovell Sheehan, MD as Consulting Physician (Orthopedic Surgery) Lovell Sheehan, MD as Consulting Physician (Orthopedic Surgery)  Indicate any recent Medical Services you may have received from other than [facility] providers in the past year (date may be approximate).     Assessment:   This is a routine wellness examination for [place].  Hearing/Vision screen No results  found.  Dietary issues and exercise activities discussed:     Goals Addressed   None   Depression Screen PHQ 2/9 Scores 03/12/2021 11/25/2019 02/17/2019 11/20/2018 09/06/2017 09/01/2016  PHQ - 2 Score 0 0 0 0 0 0  PHQ- 9 Score - - 2 0 - 1    Fall Risk Fall Risk  03/12/2021 11/25/2019 02/17/2019 11/20/2018 09/06/2017  Falls in the past year? 0 0 0 0 No  Number falls in past yr: 0 0 - - -  Injury with Fall? 0 0 - - -  Follow up Education provided - Falls evaluation completed - -    FALL RISK PREVENTION PERTAINING TO THE HOME:  Any stairs in or around the home? No  If so, are there any without handrails? No  Home free of loose throw rugs in walkways, pet beds, electrical cords, etc? Yes  Adequate lighting in your home to reduce risk of falls? Yes   ASSISTIVE DEVICES UTILIZED TO PREVENT FALLS:  Life alert? Yes  Use of a cane, walker or w/c? No  Grab bars in the bathroom? Yes  Shower chair or bench in shower? Yes  Elevated toilet seat or a handicapped toilet? Yes   TIMED UP AND GO:  Was the test performed? No .  Length of time to ambulate 10 feet: N/A sec.     Cognitive Function:     6CIT Screen 09/01/2016  What Year? 0 points  What month? 0 points  What time? 0 points  Count back from 20 0 points  Months in reverse 0 points  Repeat phrase 0 points  Total Score 0    Immunizations Immunization History  Administered Date(s) Administered   Fluad Quad(high Dose 65+) 02/17/2019   Influenza, High Dose Seasonal PF 03/11/2018, 03/10/2021   Influenza-Unspecified 02/05/2012, 03/22/2014, 03/15/2015, 03/31/2016   PFIZER(Purple Top)SARS-COV-2 Vaccination 07/12/2019, 08/02/2019, 02/28/2021   Pneumococcal Conjugate-13 06/23/2014   Pneumococcal Polysaccharide-23 03/10/2021   Zoster Recombinat (Shingrix) 12/24/2018, 02/25/2019    TDAP status: Up to date  Flu Vaccine status: Up to date  Pneumococcal vaccine status: Up to date  Covid-19 vaccine status: Completed  vaccines  Qualifies for Shingles Vaccine? Yes   Zostavax completed Yes   Shingrix Completed?: Yes  Screening Tests Health Maintenance  Topic Date Due   TETANUS/TDAP  08/29/2023   INFLUENZA VACCINE  Completed   COVID-19 Vaccine  Completed   Zoster Vaccines- Shingrix  Completed   HPV VACCINES  Aged Out    Health Maintenance  There are no preventive care reminders to display for this patient.   Colorectal cancer screening: No longer required.   Lung Cancer Screening: (Low Dose CT Chest recommended if Age 34-80 years, 30 pack-year currently smoking OR have quit w/in 15years.) does not qualify.   Lung Cancer Screening Referral: N/A  Additional Screening:  Hepatitis C Screening: does not qualify; Completed N/A  Vision Screening: Recommended annual ophthalmology exams for early detection of glaucoma and other disorders of the eye. Is the patient up to date with their annual eye exam?  Yes  Who is the provider or what is the name of the office in which the patient attends annual eye exams? [facility] If pt is not established with a provider, would they like to be referred to a provider to establish care? No .   Dental Screening: Recommended annual dental exams for proper oral hygiene  Community Resource Referral / Chronic Care Management: CRR required this visit?  No   CCM required this visit?  No      Plan:     I have personally reviewed and noted the following in the patient's chart:   Medical and social history Use of alcohol, tobacco or illicit drugs  Current medications and supplements including opioid prescriptions. Patient is not currently taking opioid prescriptions. Functional ability and status Nutritional status Physical activity Advanced directives List of other physicians Hospitalizations, surgeries, and ER visits in previous 12 months Vitals Screenings to include cognitive, depression, and falls Referrals and appointments  In addition, I  have reviewed and discussed with patient certain preventive protocols, quality metrics, and best practice recommendations. A written personalized care plan for preventive services as well as general preventive health recommendations were provided to patient.     Loralyn Freshwater, [facility]  03/14/2021   Nurse Notes: Non face to face 30 minutes   Mr. Venable , Thank you for taking time to come for your Medicare Wellness Visit. I appreciate your ongoing commitment to your health goals. Please review the following plan we discussed and let me know if I can assist you in the future.   These are the goals we discussed:  Goals      Exercise 3x per week (30 min per time)     Recommend to exercise for 3 days a week for at least 30 minutes at a time.         This is a list of the screening recommended for you and due dates:  Health Maintenance  Topic Date Due   Tetanus Vaccine  08/29/2023   Flu Shot  Completed   COVID-19 Vaccine  Completed   Zoster (Shingles) Vaccine  Completed   HPV Vaccine  Aged Out

## 2021-03-12 NOTE — Patient Instructions (Signed)
Health Maintenance, Male Adopting a healthy lifestyle and getting preventive care are important in promoting health and wellness. Ask your health care provider about: The right schedule for you to have regular tests and exams. Things you can do on your own to prevent diseases and keep yourself healthy. What should I know about diet, weight, and exercise? Eat a healthy diet  Eat a diet that includes plenty of vegetables, fruits, low-fat dairy products, and lean protein. Do not eat a lot of foods that are high in solid fats, added sugars, or sodium. Maintain a healthy weight Body mass index (BMI) is a measurement that can be used to identify possible weight problems. It estimates body fat based on height and weight. Your health care provider can help determine your BMI and help you achieve or maintain a healthy weight. Get regular exercise Get regular exercise. This is one of the most important things you can do for your health. Most adults should: Exercise for at least 150 minutes each week. The exercise should increase your heart rate and make you sweat (moderate-intensity exercise). Do strengthening exercises at least twice a week. This is in addition to the moderate-intensity exercise. Spend less time sitting. Even light physical activity can be beneficial. Watch cholesterol and blood lipids Have your blood tested for lipids and cholesterol at 85 years of age, then have this test every 5 years. You may need to have your cholesterol levels checked more often if: Your lipid or cholesterol levels are high. You are older than 85 years of age. You are at high risk for heart disease. What should I know about cancer screening? Many types of cancers can be detected early and may often be prevented. Depending on your health history and family history, you may need to have cancer screening at various ages. This may include screening for: Colorectal cancer. Prostate cancer. Skin cancer. Lung  cancer. What should I know about heart disease, diabetes, and high blood pressure? Blood pressure and heart disease High blood pressure causes heart disease and increases the risk of stroke. This is more likely to develop in people who have high blood pressure readings, are of African descent, or are overweight. Talk with your health care provider about your target blood pressure readings. Have your blood pressure checked: Every 3-5 years if you are 18-39 years of age. Every year if you are 40 years old or older. If you are between the ages of 65 and 75 and are a current or former smoker, ask your health care provider if you should have a one-time screening for abdominal aortic aneurysm (AAA). Diabetes Have regular diabetes screenings. This checks your fasting blood sugar level. Have the screening done: Once every three years after age 45 if you are at a normal weight and have a low risk for diabetes. More often and at a younger age if you are overweight or have a high risk for diabetes. What should I know about preventing infection? Hepatitis B If you have a higher risk for hepatitis B, you should be screened for this virus. Talk with your health care provider to find out if you are at risk for hepatitis B infection. Hepatitis C Blood testing is recommended for: Everyone born from 1945 through 1965. Anyone with known risk factors for hepatitis C. Sexually transmitted infections (STIs) You should be screened each year for STIs, including gonorrhea and chlamydia, if: You are sexually active and are younger than 85 years of age. You are older than 85 years   of age and your health care provider tells you that you are at risk for this type of infection. Your sexual activity has changed since you were last screened, and you are at increased risk for chlamydia or gonorrhea. Ask your health care provider if you are at risk. Ask your health care provider about whether you are at high risk for HIV.  Your health care provider may recommend a prescription medicine to help prevent HIV infection. If you choose to take medicine to prevent HIV, you should first get tested for HIV. You should then be tested every 3 months for as long as you are taking the medicine. Follow these instructions at home: Lifestyle Do not use any products that contain nicotine or tobacco, such as cigarettes, e-cigarettes, and chewing tobacco. If you need help quitting, ask your health care provider. Do not use street drugs. Do not share needles. Ask your health care provider for help if you need support or information about quitting drugs. Alcohol use Do not drink alcohol if your health care provider tells you not to drink. If you drink alcohol: Limit how much you have to 0-2 drinks a day. Be aware of how much alcohol is in your drink. In the [place] one drink equals one 12 oz bottle of beer (355 mL), one 5 oz glass of wine (148 mL), or one 1 oz glass of hard liquor (44 mL). General instructions Schedule regular health, dental, and eye exams. Stay current with your vaccines. Tell your health care provider if: You often feel depressed. You have ever been abused or do not feel safe at home. Summary Adopting a healthy lifestyle and getting preventive care are important in promoting health and wellness. Follow your health care provider's instructions about healthy diet, exercising, and getting tested or screened for diseases. Follow your health care provider's instructions on monitoring your cholesterol and blood pressure. This information is not intended to replace advice given to you by your health care provider. Make sure you discuss any questions you have with your health care provider. Document Revised: 08/13/2020 Document Reviewed: 05/29/2018 Elsevier Patient Education  2022 Elsevier Inc.  

## 2021-04-11 ENCOUNTER — Telehealth: Payer: Self-pay

## 2021-04-11 NOTE — Telephone Encounter (Signed)
Copied from [place] [phone]. Topic: General - Call Back - No Documentation >> Apr 11, 2021  1:55 PM Erick Blinks wrote: Reason for CRM: Pt wants to speak to office regarding insurance questions   Best contact: [phone]

## 2021-04-29 ENCOUNTER — Other Ambulatory Visit: Payer: Self-pay | Admitting: Family Medicine

## 2021-04-29 DIAGNOSIS — N400 Enlarged prostate without lower urinary tract symptoms: Secondary | ICD-10-CM

## 2021-04-29 DIAGNOSIS — I4891 Unspecified atrial fibrillation: Secondary | ICD-10-CM

## 2021-04-29 DIAGNOSIS — Z8739 Personal history of other diseases of the musculoskeletal system and connective tissue: Secondary | ICD-10-CM

## 2021-04-29 DIAGNOSIS — E7800 Pure hypercholesterolemia, unspecified: Secondary | ICD-10-CM

## 2021-04-29 DIAGNOSIS — I10 Essential (primary) hypertension: Secondary | ICD-10-CM

## 2021-04-30 NOTE — Telephone Encounter (Signed)
Requested Prescriptions  Pending Prescriptions Disp Refills  . tamsulosin (FLOMAX) 0.4 MG CAPS capsule [Pharmacy Med Name: Tamsulosin HCl 0.4 MG Oral Capsule] 90 capsule 0    Sig: TAKE 1 CAPSULE BY MOUTH  DAILY     Urology: Alpha-Adrenergic Blocker Passed - 04/29/2021 10:26 PM      Passed - Last BP in normal range    BP Readings from Last 1 Encounters:  12/01/20 103/66         Passed - Valid encounter within last 12 months    Recent Outpatient Visits          5 months ago Contact dermatitis, unspecified contact dermatitis type, unspecified trigger   [facility] Birdie Sons, MD   1 year ago Annual physical exam   [facility] Birdie Sons, MD   2 years ago Need for zoster vaccination   [facility] Birdie Sons, MD   2 years ago Annual physical exam   [facility] Birdie Sons, MD   2 years ago Contact dermatitis, unspecified contact dermatitis type, unspecified trigger   [facility] Terrilee Croak, Adriana M, [place]             . metoprolol tartrate (LOPRESSOR) 50 MG tablet [Pharmacy Med Name: Metoprolol Tartrate 50 MG Oral Tablet] 90 tablet 3    Sig: TAKE 1 TABLET BY MOUTH  DAILY     Cardiovascular:  Beta Blockers Passed - 04/29/2021 10:26 PM      Passed - Last BP in normal range    BP Readings from Last 1 Encounters:  12/01/20 103/66         Passed - Last Heart Rate in normal range    Pulse Readings from Last 1 Encounters:  12/01/20 70         Passed - Valid encounter within last 6 months    Recent Outpatient Visits          5 months ago Contact dermatitis, unspecified contact dermatitis type, unspecified trigger   [facility] Birdie Sons, MD   1 year ago Annual physical exam   [facility] Birdie Sons, MD   2 years ago Need for zoster vaccination   [facility] Birdie Sons, MD   2 years ago Annual physical exam   [facility] Birdie Sons, MD   2 years ago Contact dermatitis, unspecified contact dermatitis type, unspecified trigger   [facility] [place], Adriana M, PA-C             . allopurinol (ZYLOPRIM) 300 MG tablet [Pharmacy Med Name: Allopurinol 300 MG Oral Tablet] 90 tablet 3    Sig: TAKE 1 TABLET BY MOUTH  DAILY     Endocrinology:  Gout Agents Failed - 04/29/2021 10:26 PM      Failed - Uric Acid in normal range and within 360 days    Uric Acid  Date Value Ref Range Status  02/17/2019 4.4 3.7 - 8.6 mg/dL Final    Comment:               Therapeutic target for gout patients: <6.0         Failed - Cr in normal range and within 360 days    Creatinine, Ser  Date Value Ref Range Status  11/30/2020 1.31 (H) 0.61 - 1.24 mg/dL Final         Passed - Valid encounter within last 12 months  Recent Outpatient Visits          5 months ago Contact dermatitis, unspecified contact dermatitis type, unspecified trigger   [facility] Birdie Sons, MD   1 year ago Annual physical exam   [facility] Birdie Sons, MD   2 years ago Need for zoster vaccination   [facility] Birdie Sons, MD   2 years ago Annual physical exam   [facility] Birdie Sons, MD   2 years ago Contact dermatitis, unspecified contact dermatitis type, unspecified trigger   [facility] [place], Adriana M, [place]             . simvastatin (ZOCOR) 20 MG tablet [Pharmacy Med Name: Simvastatin 20 MG Oral Tablet] 90 tablet 3    Sig: TAKE 1 TABLET BY MOUTH  DAILY     Cardiovascular:  Antilipid - Statins Failed - 04/29/2021 10:26 PM      Failed - Total Cholesterol in normal range and within 360 days    Cholesterol, Total  Date Value Ref Range Status  02/18/2020 130 100 - 199 mg/dL Final         Failed - LDL in normal range and within 360 days    LDL Chol Calc (NIH)  Date Value Ref Range Status  02/18/2020 56  0 - 99 mg/dL Final         Failed - HDL in normal range and within 360 days    HDL  Date Value Ref Range Status  02/18/2020 57 >39 mg/dL Final         Failed - Triglycerides in normal range and within 360 days    Triglycerides  Date Value Ref Range Status  02/18/2020 86 0 - 149 mg/dL Final         Passed - Patient is not pregnant      Passed - Valid encounter within last 12 months    Recent Outpatient Visits          5 months ago Contact dermatitis, unspecified contact dermatitis type, unspecified trigger   [facility] Birdie Sons, MD   1 year ago Annual physical exam   [facility] Birdie Sons, MD   2 years ago Need for zoster vaccination   [facility] Birdie Sons, MD   2 years ago Annual physical exam   [facility] Birdie Sons, MD   2 years ago Contact dermatitis, unspecified contact dermatitis type, unspecified trigger   [place], Wendee Beavers, [place]

## 2021-04-30 NOTE — Telephone Encounter (Signed)
Requested medications are due for refill today yes  Requested medications are on the active medication list yes  Last visit 02/18/20  Future visit scheduled no  Notes to clinic failed protocol of visit within 6 or 12 months, labs out of date, no upcoming appt scheduled, please assess. Requested Prescriptions  Pending Prescriptions Disp Refills   metoprolol tartrate (LOPRESSOR) 50 MG tablet [Pharmacy Med Name: Metoprolol Tartrate 50 MG Oral Tablet] 90 tablet 3    Sig: TAKE 1 TABLET BY MOUTH  DAILY     Cardiovascular:  Beta Blockers Passed - 04/29/2021 10:26 PM      Passed - Last BP in normal range    BP Readings from Last 1 Encounters:  12/01/20 103/66          Passed - Last Heart Rate in normal range    Pulse Readings from Last 1 Encounters:  12/01/20 70          Passed - Valid encounter within last 6 months    Recent Outpatient Visits           5 months ago Contact dermatitis, unspecified contact dermatitis type, unspecified trigger   [facility] Birdie Sons, MD   1 year ago Annual physical exam   [facility] Birdie Sons, MD   2 years ago Need for zoster vaccination   [facility] Birdie Sons, MD   2 years ago Annual physical exam   [facility] Birdie Sons, MD   2 years ago Contact dermatitis, unspecified contact dermatitis type, unspecified trigger   [facility] Terrilee Croak, Adriana M, PA-C               allopurinol (ZYLOPRIM) 300 MG tablet [Pharmacy Med Name: Allopurinol 300 MG Oral Tablet] 90 tablet 3    Sig: TAKE 1 TABLET BY MOUTH  DAILY     Endocrinology:  Gout Agents Failed - 04/29/2021 10:26 PM      Failed - Uric Acid in normal range and within 360 days    Uric Acid  Date Value Ref Range Status  02/17/2019 4.4 3.7 - 8.6 mg/dL Final    Comment:               Therapeutic target for gout patients: <6.0          Failed - Cr in normal range and within 360 days     Creatinine, Ser  Date Value Ref Range Status  11/30/2020 1.31 (H) 0.61 - 1.24 mg/dL Final          Passed - Valid encounter within last 12 months    Recent Outpatient Visits           5 months ago Contact dermatitis, unspecified contact dermatitis type, unspecified trigger   [facility] Birdie Sons, MD   1 year ago Annual physical exam   [facility] Birdie Sons, MD   2 years ago Need for zoster vaccination   [facility] Birdie Sons, MD   2 years ago Annual physical exam   [facility] Birdie Sons, MD   2 years ago Contact dermatitis, unspecified contact dermatitis type, unspecified trigger   [place], Adriana M, PA-C               simvastatin (ZOCOR) 20 MG tablet [Pharmacy Med Name: Simvastatin 20 MG Oral Tablet] 90 tablet 3    Sig: TAKE 1 TABLET BY MOUTH  DAILY     Cardiovascular:  Antilipid - Statins Failed - 04/29/2021 10:26 PM      Failed - Total Cholesterol in normal range and within 360 days    Cholesterol, Total  Date Value Ref Range Status  02/18/2020 130 100 - 199 mg/dL Final          Failed - LDL in normal range and within 360 days    LDL Chol Calc (NIH)  Date Value Ref Range Status  02/18/2020 56 0 - 99 mg/dL Final          Failed - HDL in normal range and within 360 days    HDL  Date Value Ref Range Status  02/18/2020 57 >39 mg/dL Final          Failed - Triglycerides in normal range and within 360 days    Triglycerides  Date Value Ref Range Status  02/18/2020 86 0 - 149 mg/dL Final          Passed - Patient is not pregnant      Passed - Valid encounter within last 12 months    Recent Outpatient Visits           5 months ago Contact dermatitis, unspecified contact dermatitis type, unspecified trigger   [facility] Birdie Sons, MD   1 year ago Annual physical exam   [facility] Birdie Sons, MD    2 years ago Need for zoster vaccination   [facility] Birdie Sons, MD   2 years ago Annual physical exam   [facility] Birdie Sons, MD   2 years ago Contact dermatitis, unspecified contact dermatitis type, unspecified trigger   [place], Adriana M, [place]              Signed Prescriptions Disp Refills   tamsulosin (FLOMAX) 0.4 MG CAPS capsule 90 capsule 0    Sig: TAKE 1 CAPSULE BY MOUTH  DAILY     Urology: Alpha-Adrenergic Blocker Passed - 04/29/2021 10:26 PM      Passed - Last BP in normal range    BP Readings from Last 1 Encounters:  12/01/20 103/66          Passed - Valid encounter within last 12 months    Recent Outpatient Visits           5 months ago Contact dermatitis, unspecified contact dermatitis type, unspecified trigger   [facility] Birdie Sons, MD   1 year ago Annual physical exam   [facility] Birdie Sons, MD   2 years ago Need for zoster vaccination   [facility] Birdie Sons, MD   2 years ago Annual physical exam   [facility] Birdie Sons, MD   2 years ago Contact dermatitis, unspecified contact dermatitis type, unspecified trigger   [facility], Wendee Beavers, [place]

## 2021-06-28 DIAGNOSIS — I495 Sick sinus syndrome: Secondary | ICD-10-CM | POA: Diagnosis not present

## 2021-07-20 ENCOUNTER — Other Ambulatory Visit: Payer: Self-pay | Admitting: Family Medicine

## 2021-07-20 DIAGNOSIS — Z8739 Personal history of other diseases of the musculoskeletal system and connective tissue: Secondary | ICD-10-CM

## 2021-07-20 MED ORDER — ALLOPURINOL 300 MG PO TABS: 300.0000 mg | ORAL_TABLET | Freq: Every day | ORAL | 0 refills | Status: DC

## 2021-07-20 NOTE — Telephone Encounter (Signed)
[facility] faxed refill request for the following medications:  allopurinol (ZYLOPRIM) 300 MG tablet    Please advise.

## 2021-07-20 NOTE — Telephone Encounter (Signed)
Last refill: 05/02/2021 #90 with 0 refills  Last office visit: 11/09/2020 (seen for evaluation of contact dermatitis)  No future appt. Scheduled.

## 2021-07-22 ENCOUNTER — Telehealth: Payer: Self-pay | Admitting: Family Medicine

## 2021-07-22 NOTE — Telephone Encounter (Signed)
Patient due for follow up appointment. Appointment scheduled for 07/29/2021 at 9:40am. Patient states he has enough medication to get by until that appointment.

## 2021-07-22 NOTE — Telephone Encounter (Signed)
[facility] faxed refill request for the following medications:  tamsulosin (FLOMAX) 0.4 MG CAPS capsule   Please advise.

## 2021-07-29 ENCOUNTER — Encounter: Payer: Self-pay | Admitting: Family Medicine

## 2021-07-29 ENCOUNTER — Ambulatory Visit (INDEPENDENT_AMBULATORY_CARE_PROVIDER_SITE_OTHER): Payer: Medicare Other | Admitting: Family Medicine

## 2021-07-29 ENCOUNTER — Other Ambulatory Visit: Payer: Self-pay

## 2021-07-29 VITALS — BP 127/79 | HR 73 | Temp 97.6°F | Resp 16 | Wt 216.0 lb

## 2021-07-29 DIAGNOSIS — E7800 Pure hypercholesterolemia, unspecified: Secondary | ICD-10-CM | POA: Diagnosis not present

## 2021-07-29 DIAGNOSIS — D689 Coagulation defect, unspecified: Secondary | ICD-10-CM

## 2021-07-29 DIAGNOSIS — I4891 Unspecified atrial fibrillation: Secondary | ICD-10-CM | POA: Diagnosis not present

## 2021-07-29 DIAGNOSIS — N400 Enlarged prostate without lower urinary tract symptoms: Secondary | ICD-10-CM

## 2021-07-29 DIAGNOSIS — E790 Hyperuricemia without signs of inflammatory arthritis and tophaceous disease: Secondary | ICD-10-CM

## 2021-07-29 DIAGNOSIS — I10 Essential (primary) hypertension: Secondary | ICD-10-CM | POA: Diagnosis not present

## 2021-07-29 MED ORDER — LOSARTAN POTASSIUM 50 MG PO TABS: 50.0000 mg | ORAL_TABLET | ORAL | 4 refills | Status: DC

## 2021-07-29 MED ORDER — SIMVASTATIN 20 MG PO TABS: 20.0000 mg | ORAL_TABLET | Freq: Every day | ORAL | 4 refills | Status: DC

## 2021-07-29 MED ORDER — METOPROLOL TARTRATE 50 MG PO TABS: 50.0000 mg | ORAL_TABLET | Freq: Every day | ORAL | 4 refills | Status: DC

## 2021-07-29 MED ORDER — TAMSULOSIN HCL 0.4 MG PO CAPS: 0.4000 mg | ORAL_CAPSULE | Freq: Every day | ORAL | 4 refills | Status: DC

## 2021-07-29 NOTE — Progress Notes (Signed)
Established patient visit   Patient: Grant Schmitt   DOB: 1928/07/12   88 y.o. Male  MRN: [id number] Visit Date: 07/29/2021  Today's healthcare provider: Lelon Huh, MD   Chief Complaint  Patient presents with   Hyperlipidemia   Hypertension   Subjective    HPI  Lipid/Cholesterol, Follow-up  Last lipid panel Other pertinent labs  Lab Results  Component Value Date   CHOL 130 02/18/2020   HDL 57 02/18/2020   LDLCALC 56 02/18/2020   TRIG 86 02/18/2020   CHOLHDL 2.3 02/18/2020   Lab Results  Component Value Date   ALT 24 02/18/2020   AST 20 02/18/2020   PLT 172 12/01/2020     He was last seen for this on 02/18/2020.    Management since that visit includes continue same medications.  He reports good compliance with treatment. He is not having side effects.   Symptoms: No chest pain No chest pressure/discomfort  No dyspnea No lower extremity edema  No numbness or tingling of extremity No orthopnea  No palpitations No paroxysmal nocturnal dyspnea  No speech difficulty No syncope   Current diet: in general, a "healthy" diet   Current exercise:  stationary bike  The ASCVD Risk score (Arnett DK, et al., 2019) failed to calculate for the following reasons:   The 2019 ASCVD risk score is only valid for ages 64 to 88  ---------------------------------------------------------------------------------------------------   Hypertension, follow-up  BP Readings from Last 3 Encounters:  07/29/21 127/79  12/01/20 103/66  11/19/20 128/86   Wt Readings from Last 3 Encounters:  07/29/21 216 lb (98 kg)  11/29/20 210 lb (95.3 kg)  11/19/20 207 lb 3.7 oz (94 kg)     He was last seen for hypertension on 02/18/2020.   BP at that visit was 125/84. Management since that visit includes continue same medication.  He reports good compliance with treatment. He is not having side effects.  He is following a Regular diet. He is exercising. He does not smoke.  Use of  agents associated with hypertension: none.   Outside blood pressures are checked occasionally. Symptoms: No chest pain No chest pressure  No palpitations No syncope  No dyspnea No orthopnea  No paroxysmal nocturnal dyspnea No lower extremity edema   Pertinent labs: Lab Results  Component Value Date   CHOL 130 02/18/2020   HDL 57 02/18/2020   LDLCALC 56 02/18/2020   TRIG 86 02/18/2020   CHOLHDL 2.3 02/18/2020   Lab Results  Component Value Date   NA 135 11/30/2020   K 4.5 11/30/2020   CREATININE 1.31 (H) 11/30/2020   GFRNONAA 52 (L) 11/30/2020   GLUCOSE 111 (H) 11/30/2020     The ASCVD Risk score (Arnett DK, et al., 2019) failed to calculate for the following reasons:   The 2019 ASCVD risk score is only valid for ages 24 to 4   ---------------------------------------------------------------------------------------------------   Medications: Outpatient Medications Prior to Visit  Medication Sig   allopurinol (ZYLOPRIM) 300 MG tablet Take 1 tablet (300 mg total) by mouth daily.   Ascorbic Acid (VITAMIN C) 1000 MG tablet Take 1,000 mg by mouth daily.   calcium carbonate (TUMS - DOSED IN MG ELEMENTAL CALCIUM) 500 MG chewable tablet Chew 1 tablet by mouth as needed for indigestion or heartburn.   Cholecalciferol (VITAMIN D3) 2000 units TABS Take 2,000 Units by mouth daily.    docusate sodium (COLACE) 100 MG capsule Take 1 capsule (100 mg total) by mouth 2 (two)  times daily.   losartan (COZAAR) 50 MG tablet Take 50 mg by mouth every morning.   metoprolol tartrate (LOPRESSOR) 50 MG tablet TAKE 1 TABLET BY MOUTH  DAILY   simvastatin (ZOCOR) 20 MG tablet TAKE 1 TABLET BY MOUTH  DAILY   tamsulosin (FLOMAX) 0.4 MG CAPS capsule TAKE 1 CAPSULE BY MOUTH  DAILY   XARELTO 20 MG TABS tablet Take 1 tablet (20 mg total) by mouth daily.   clotrimazole-betamethasone (LOTRISONE) lotion APPLY  LOTION TOPICALLY TWICE DAILY (Patient not taking: Reported on 07/29/2021)   No facility-administered  medications prior to visit.    Review of Systems  Constitutional:  Negative for appetite change, chills and fever.  Respiratory:  Negative for chest tightness, shortness of breath and wheezing.   Cardiovascular:  Negative for chest pain and palpitations.  Gastrointestinal:  Negative for abdominal pain, nausea and vomiting.      Objective    BP 127/79 (BP Location: Left Arm, Patient Position: Sitting, Cuff Size: Large)    Pulse 73    Temp 97.6 F (36.4 C) (Oral)    Resp 16    Wt 216 lb (98 kg)    SpO2 100% Comment: room air   BMI 30.13 kg/m  {Show previous vital signs (optional):23777}  Physical Exam   General: Appearance:    Mildly obese male in no acute distress  Eyes:    PERRL, conjunctiva/corneas clear, EOM's intact       Lungs:     Clear to auscultation bilaterally, respirations unlabored  Heart:    Normal heart rate. Regular rhythm.   MS:   All extremities are intact.    Neurologic:   Awake, alert, oriented x 3. No apparent focal neurological defect.         Assessment & Plan     1. Primary hypertension Well controlled.  Continue current medications.   - losartan (COZAAR) 50 MG tablet; Take 1 tablet (50 mg total) by mouth every morning.  Dispense: 90 tablet; Refill: 4  2. Hypercholesterolemia  - CBC - Comprehensive metabolic panel - Lipid panel - simvastatin (ZOCOR) 20 MG tablet; Take 1 tablet (20 mg total) by mouth daily.  Dispense: 90 tablet; Refill: 4  3. Atrial fibrillation, unspecified type ([place]) He is tolerating simvastatin well with no adverse effects.   - metoprolol tartrate (LOPRESSOR) 50 MG tablet; Take 1 tablet (50 mg total) by mouth daily.  Dispense: 90 tablet; Refill: 4  4. Acquired coagulation disorder ([place]) On [place] for a-fib. Continue routine follow up with cardiology. No s/s abnormal bleeding or brusing.   5. Hyperuricemia Doing well on allopurinol. Was just refilled last week.   6. Benign prostatic hyperplasia without lower urinary tract  symptoms Denies any light headedness of dizziness. refill tamsulosin (FLOMAX) 0.4 MG CAPS capsule; Take 1 capsule (0.4 mg total) by mouth daily.  Dispense: 90 capsule; Refill: 4       The entirety of the information documented in the History of Present Illness, Review of Systems and Physical Exam were personally obtained by me. Portions of this information were initially documented by the CMA and reviewed by me for thoroughness and accuracy.     Lelon Huh, MD  [facility] [phone] (phone) [phone] (fax)  [place]

## 2021-07-30 LAB — LIPID PANEL
Chol/HDL Ratio: 2.2 ratio (ref 0.0–5.0)
Cholesterol, Total: 119 mg/dL (ref 100–199)
HDL: 53 mg/dL (ref >39)
LDL Chol Calc (NIH): 52 mg/dL (ref 0–99)
Triglycerides: 63 mg/dL (ref 0–149)
VLDL Cholesterol Cal: 14 mg/dL (ref 5–40)

## 2021-07-30 LAB — CBC
Hematocrit: 36.6 % — ABNORMAL LOW (ref 37.5–51.0)
Hemoglobin: 12.1 g/dL — ABNORMAL LOW (ref 13.0–17.7)
MCH: 31.6 pg (ref 26.6–33.0)
MCHC: 33.1 g/dL (ref 31.5–35.7)
MCV: 96 fL (ref 79–97)
Platelets: 198 10*3/uL (ref 150–450)
RBC: 3.83 x10E6/uL — ABNORMAL LOW (ref 4.14–5.80)
RDW: 13.1 % (ref 11.6–15.4)
WBC: 10.1 10*3/uL (ref 3.4–10.8)

## 2021-07-30 LAB — COMPREHENSIVE METABOLIC PANEL
ALT: 9 IU/L (ref 0–44)
AST: 19 IU/L (ref 0–40)
Albumin/Globulin Ratio: 1.7 (ref 1.2–2.2)
Albumin: 4.3 g/dL (ref 3.5–4.6)
Alkaline Phosphatase: 79 IU/L (ref 44–121)
BUN/Creatinine Ratio: 23 (ref 10–24)
BUN: 32 mg/dL (ref 10–36)
Bilirubin Total: 0.6 mg/dL (ref 0.0–1.2)
CO2: 25 mmol/L (ref 20–29)
Calcium: 10.1 mg/dL (ref 8.6–10.2)
Chloride: 101 mmol/L (ref 96–106)
Creatinine, Ser: 1.37 mg/dL — ABNORMAL HIGH (ref 0.76–1.27)
Globulin, Total: 2.6 g/dL (ref 1.5–4.5)
Glucose: 98 mg/dL (ref 70–99)
Potassium: 5.2 mmol/L (ref 3.5–5.2)
Sodium: 139 mmol/L (ref 134–144)
Total Protein: 6.9 g/dL (ref 6.0–8.5)
eGFR: 49 mL/min/{1.73_m2} — ABNORMAL LOW (ref >59)

## 2021-09-27 ENCOUNTER — Other Ambulatory Visit: Payer: Self-pay | Admitting: Family Medicine

## 2021-09-27 DIAGNOSIS — Z8739 Personal history of other diseases of the musculoskeletal system and connective tissue: Secondary | ICD-10-CM

## 2021-10-31 DIAGNOSIS — Z96652 Presence of left artificial knee joint: Secondary | ICD-10-CM | POA: Diagnosis not present

## 2021-11-16 DIAGNOSIS — Z08 Encounter for follow-up examination after completed treatment for malignant neoplasm: Secondary | ICD-10-CM | POA: Diagnosis not present

## 2021-11-16 DIAGNOSIS — L814 Other melanin hyperpigmentation: Secondary | ICD-10-CM | POA: Diagnosis not present

## 2021-11-16 DIAGNOSIS — L570 Actinic keratosis: Secondary | ICD-10-CM | POA: Diagnosis not present

## 2021-11-16 DIAGNOSIS — D1801 Hemangioma of skin and subcutaneous tissue: Secondary | ICD-10-CM | POA: Diagnosis not present

## 2021-11-16 DIAGNOSIS — Z85828 Personal history of other malignant neoplasm of skin: Secondary | ICD-10-CM | POA: Diagnosis not present

## 2021-11-16 DIAGNOSIS — L821 Other seborrheic keratosis: Secondary | ICD-10-CM | POA: Diagnosis not present

## 2021-12-13 DIAGNOSIS — I480 Paroxysmal atrial fibrillation: Secondary | ICD-10-CM | POA: Diagnosis not present

## 2022-01-25 DIAGNOSIS — H2511 Age-related nuclear cataract, right eye: Secondary | ICD-10-CM | POA: Diagnosis not present

## 2022-01-30 ENCOUNTER — Encounter: Payer: Self-pay | Admitting: Family Medicine

## 2022-01-30 ENCOUNTER — Ambulatory Visit (INDEPENDENT_AMBULATORY_CARE_PROVIDER_SITE_OTHER): Payer: Medicare Other | Admitting: Family Medicine

## 2022-01-30 VITALS — BP 111/67 | HR 87 | Temp 97.5°F | Resp 16 | Wt 218.0 lb

## 2022-01-30 DIAGNOSIS — G2581 Restless legs syndrome: Secondary | ICD-10-CM | POA: Diagnosis not present

## 2022-01-30 DIAGNOSIS — R200 Anesthesia of skin: Secondary | ICD-10-CM | POA: Diagnosis not present

## 2022-01-30 DIAGNOSIS — R202 Paresthesia of skin: Secondary | ICD-10-CM | POA: Insufficient documentation

## 2022-01-30 DIAGNOSIS — E7800 Pure hypercholesterolemia, unspecified: Secondary | ICD-10-CM

## 2022-01-30 DIAGNOSIS — D649 Anemia, unspecified: Secondary | ICD-10-CM | POA: Diagnosis not present

## 2022-01-30 DIAGNOSIS — I10 Essential (primary) hypertension: Secondary | ICD-10-CM

## 2022-01-30 NOTE — Progress Notes (Signed)
I,Roshena L Chambers,acting as a scribe for Lelon Huh, MD.,have documented all relevant documentation on the behalf of Lelon Huh, MD,as directed by  Lelon Huh, MD while in the presence of Lelon Huh, MD.   Established patient visit   Patient: Grant Schmitt   DOB: 13-Nov-1928   88 y.o. Male  MRN: [id number] Visit Date: 01/30/2022  Today's healthcare provider: Lelon Huh, MD   Chief Complaint  Patient presents with   Hypertension   Hyperlipidemia   Subjective    Hypertension, follow-up  BP Readings from Last 3 Encounters:  01/30/22 111/67  07/29/21 127/79  12/01/20 103/66   Wt Readings from Last 3 Encounters:  01/30/22 218 lb (98.9 kg)  07/29/21 216 lb (98 kg)  11/29/20 210 lb (95.3 kg)     He was last seen for hypertension 6 months ago.  BP at that visit was 127/79. Management since that visit includes continue current medication.  He reports good compliance with treatment. He is not having side effects.  He is following a Regular diet. He is not exercising. He does not smoke.  Use of agents associated with hypertension: none.    Symptoms: No chest pain No chest pressure  No palpitations No syncope  No dyspnea No orthopnea  No paroxysmal nocturnal dyspnea No lower extremity edema   Pertinent labs Lab Results  Component Value Date   CHOL 119 07/29/2021   HDL 53 07/29/2021   LDLCALC 52 07/29/2021   TRIG 63 07/29/2021   CHOLHDL 2.2 07/29/2021   Lab Results  Component Value Date   NA 139 07/29/2021   K 5.2 07/29/2021   CREATININE 1.37 (H) 07/29/2021   EGFR 49 (L) 07/29/2021   GLUCOSE 98 07/29/2021     The ASCVD Risk score (Arnett DK, et al., 2019) failed to calculate for the following reasons:   The 2019 ASCVD risk score is only valid for ages 26 to 20  ---------------------------------------------------------------------------------------------------  Follow up for hypercholesterolemia   The patient was last seen for this 6  months ago. Changes made at last visit include continue Simvastatin 20 mg.  He reports good compliance with treatment. He feels that condition is Unchanged. He is not having side effects.   ----------------------------------------------------------------------------------------- He also complains the legs feel restless and tingly at night making it a little difficult to sleep.    Medications: Outpatient Medications Prior to Visit  Medication Sig   allopurinol (ZYLOPRIM) 300 MG tablet TAKE 1 TABLET BY MOUTH DAILY   Ascorbic Acid (VITAMIN C) 1000 MG tablet Take 1,000 mg by mouth daily.   calcium carbonate (TUMS - DOSED IN MG ELEMENTAL CALCIUM) 500 MG chewable tablet Chew 1 tablet by mouth as needed for indigestion or heartburn.   Cholecalciferol (VITAMIN D3) 2000 units TABS Take 2,000 Units by mouth daily.    clotrimazole-betamethasone (LOTRISONE) lotion APPLY  LOTION TOPICALLY TWICE DAILY   docusate sodium (COLACE) 100 MG capsule Take 1 capsule (100 mg total) by mouth 2 (two) times daily.   losartan (COZAAR) 50 MG tablet Take 1 tablet (50 mg total) by mouth every morning.   metoprolol tartrate (LOPRESSOR) 50 MG tablet Take 1 tablet (50 mg total) by mouth daily.   simvastatin (ZOCOR) 20 MG tablet Take 1 tablet (20 mg total) by mouth daily.   tamsulosin (FLOMAX) 0.4 MG CAPS capsule Take 1 capsule (0.4 mg total) by mouth daily.   XARELTO 20 MG TABS tablet Take 1 tablet (20 mg total) by mouth daily.  No facility-administered medications prior to visit.    Review of Systems  Constitutional:  Negative for appetite change, chills and fever.  Respiratory:  Negative for chest tightness, shortness of breath and wheezing.   Cardiovascular:  Negative for chest pain and palpitations.  Gastrointestinal:  Negative for abdominal pain, nausea and vomiting.       Objective    BP 111/67 (BP Location: Left Arm, Patient Position: Sitting, Cuff Size: Normal)   Pulse 87   Temp (!) 97.5 F (36.4 C)  (Oral)   Resp 16   Wt 218 lb (98.9 kg)   SpO2 100% Comment: room air  BMI 30.40 kg/m    Physical Exam    General: Appearance:    Overweight male in no acute distress  Eyes:    PERRL, conjunctiva/corneas clear, EOM's intact       Lungs:     Clear to auscultation bilaterally, respirations unlabored  Heart:    Normal heart rate. Irregularly irregular rhythm.   MS:   All extremities are intact.    Neurologic:   Awake, alert, oriented x 3. No apparent focal neurological defect.         Assessment & Plan     1. Hypercholesterolemia He is tolerating simvastatin well with no adverse effects.   - Lipid panel  2. Primary hypertension Well controlled. Continue current medications.   - Renal function panel - TSH  3. Anemia, unspecified type  - CBC  4. Restless leg syndrome  - Iron, TIBC and Ferritin Panel   5. Numbness and tingling Consider checking B12 levels.       The entirety of the information documented in the History of Present Illness, Review of Systems and Physical Exam were personally obtained by me. Portions of this information were initially documented by the CMA and reviewed by me for thoroughness and accuracy.     Lelon Huh, MD  [facility] [phone] (phone) [phone] (fax)  [place]

## 2022-01-31 LAB — CBC
Hematocrit: 36.5 % — ABNORMAL LOW (ref 37.5–51.0)
Hemoglobin: 12.2 g/dL — ABNORMAL LOW (ref 13.0–17.7)
MCH: 32.4 pg (ref 26.6–33.0)
MCHC: 33.4 g/dL (ref 31.5–35.7)
MCV: 97 fL (ref 79–97)
Platelets: 222 10*3/uL (ref 150–450)
RBC: 3.77 x10E6/uL — ABNORMAL LOW (ref 4.14–5.80)
RDW: 13.2 % (ref 11.6–15.4)
WBC: 9.8 10*3/uL (ref 3.4–10.8)

## 2022-01-31 LAB — RENAL FUNCTION PANEL
Albumin: 4 g/dL (ref 3.6–4.6)
BUN/Creatinine Ratio: 33 — ABNORMAL HIGH (ref 10–24)
BUN: 42 mg/dL — ABNORMAL HIGH (ref 10–36)
CO2: 22 mmol/L (ref 20–29)
Calcium: 9.8 mg/dL (ref 8.6–10.2)
Chloride: 103 mmol/L (ref 96–106)
Creatinine, Ser: 1.27 mg/dL (ref 0.76–1.27)
Glucose: 110 mg/dL — ABNORMAL HIGH (ref 70–99)
Phosphorus: 2.8 mg/dL (ref 2.8–4.1)
Potassium: 4.4 mmol/L (ref 3.5–5.2)
Sodium: 138 mmol/L (ref 134–144)
eGFR: 54 mL/min/{1.73_m2} — ABNORMAL LOW (ref >59)

## 2022-01-31 LAB — TSH: TSH: 2.2 u[IU]/mL (ref 0.450–4.500)

## 2022-01-31 LAB — IRON,TIBC AND FERRITIN PANEL
Ferritin: 116 ng/mL (ref 30–400)
Iron Saturation: 23 % (ref 15–55)
Iron: 69 ug/dL (ref 38–169)
Total Iron Binding Capacity: 294 ug/dL (ref 250–450)
UIBC: 225 ug/dL (ref 111–343)

## 2022-01-31 LAB — LIPID PANEL
Chol/HDL Ratio: 2.6 ratio (ref 0.0–5.0)
Cholesterol, Total: 126 mg/dL (ref 100–199)
HDL: 49 mg/dL (ref >39)
LDL Chol Calc (NIH): 53 mg/dL (ref 0–99)
Triglycerides: 142 mg/dL (ref 0–149)
VLDL Cholesterol Cal: 24 mg/dL (ref 5–40)

## 2022-02-09 ENCOUNTER — Telehealth: Payer: Self-pay

## 2022-02-09 ENCOUNTER — Ambulatory Visit: Payer: Self-pay | Admitting: *Deleted

## 2022-02-09 NOTE — Telephone Encounter (Signed)
Copied from [place] [id number]. Topic: General - Other >> Feb 09, 2022  1:10 PM Ja-Kwan M wrote: Reason for CRM: Pt stated he had a message to call the office. Pt requests call back at [phone]

## 2022-02-09 NOTE — Patient Outreach (Signed)
  Care Coordination   Initial Visit Note   02/09/2022 Name: Grant Schmitt MRN: [id number] DOB: [birth year]  Grant Schmitt is a 88 y.o. year old male who sees Fisher, Kirstie Peri, MD for primary care. I spoke with  Grant Schmitt by phone today  What matters to the patients health and wellness today?  Patient verbalized having no nursing or community resource needs at this time    Goals Addressed             This Visit's Progress    care coordination activities       Care Coordination Interventions: Care Management Program offered/discussed SDOH screening completed Next PCP appointment confirmed for 07/20/22 Patient encouraged to call his providers office to schedule an updated AWV-last visit 01/17/21 Patient states that he currently lives in a retirement community-[facility] and has no nursing or community resource needs at this time         SDOH assessments and interventions completed:  No  SDOH Interventions Today    Flowsheet Row Most Recent Value  SDOH Interventions   Food Insecurity Interventions Intervention Not Indicated  Housing Interventions Intervention Not Indicated  Transportation Interventions Intervention Not Indicated        Care Coordination Interventions Activated:  Yes  Care Coordination Interventions:  Yes, provided   Follow up plan: No further intervention required.   Encounter Outcome:  Pt. Visit Completed

## 2022-02-09 NOTE — Patient Instructions (Signed)
Visit Information  Thank you for taking time to visit with me today. Please don't hesitate to contact me if I can be of assistance to you.   Following are the goals we discussed today:   Goals Addressed             This Visit's Progress    care coordination activities       Care Coordination Interventions: Care Management Program offered/discussed SDOH screening completed Next PCP appointment confirmed for 07/20/22 Patient encouraged to call his providers office to schedule an updated AWV-last visit 01/17/21 Patient states that he currently lives in a retirement community-[place] and has no nursing or community resource needs at this time          If you are experiencing a Mental Health or [place] or need someone to talk to, please call the Suicide and Crisis Lifeline: 988   Patient verbalizes understanding of instructions and care plan provided today and agrees to view in [place]. Active MyChart status and patient understanding of how to access instructions and care plan via MyChart confirmed with patient.     Next PCP appointment scheduled for: 07/20/22 No further follow up required:   Elliot Gurney, [place] Worker  [facility] [phone]

## 2022-02-10 NOTE — Telephone Encounter (Signed)
I don't see a reason for a missed call to pt.  He said it came up as our number.  Also states it was something about a medicare visit. (Maybe)  he wasn't sure.

## 2022-02-22 DIAGNOSIS — M25469 Effusion, unspecified knee: Secondary | ICD-10-CM | POA: Insufficient documentation

## 2022-02-22 DIAGNOSIS — M25462 Effusion, left knee: Secondary | ICD-10-CM | POA: Diagnosis not present

## 2022-02-22 DIAGNOSIS — S82002A Unspecified fracture of left patella, initial encounter for closed fracture: Secondary | ICD-10-CM | POA: Insufficient documentation

## 2022-02-22 DIAGNOSIS — S82015A Nondisplaced osteochondral fracture of left patella, initial encounter for closed fracture: Secondary | ICD-10-CM | POA: Diagnosis not present

## 2022-02-22 HISTORY — DX: Unspecified fracture of left patella, initial encounter for closed fracture: S82.002A

## 2022-02-25 LAB — VITAMIN B12: Vitamin B-12: >2000 pg/mL — ABNORMAL HIGH (ref 232–1245)

## 2022-02-25 LAB — SPECIMEN STATUS REPORT

## 2022-02-27 DIAGNOSIS — S82015A Nondisplaced osteochondral fracture of left patella, initial encounter for closed fracture: Secondary | ICD-10-CM | POA: Diagnosis not present

## 2022-03-04 IMAGING — DX DG KNEE 1-2V PORT*L*
2 series · 2 of 2 positions shown · non-contrast
Comparison: None.

CLINICAL DATA: Status post knee replacement

EXAM:
PORTABLE LEFT KNEE - 1-2 VIEW

[knee ap]
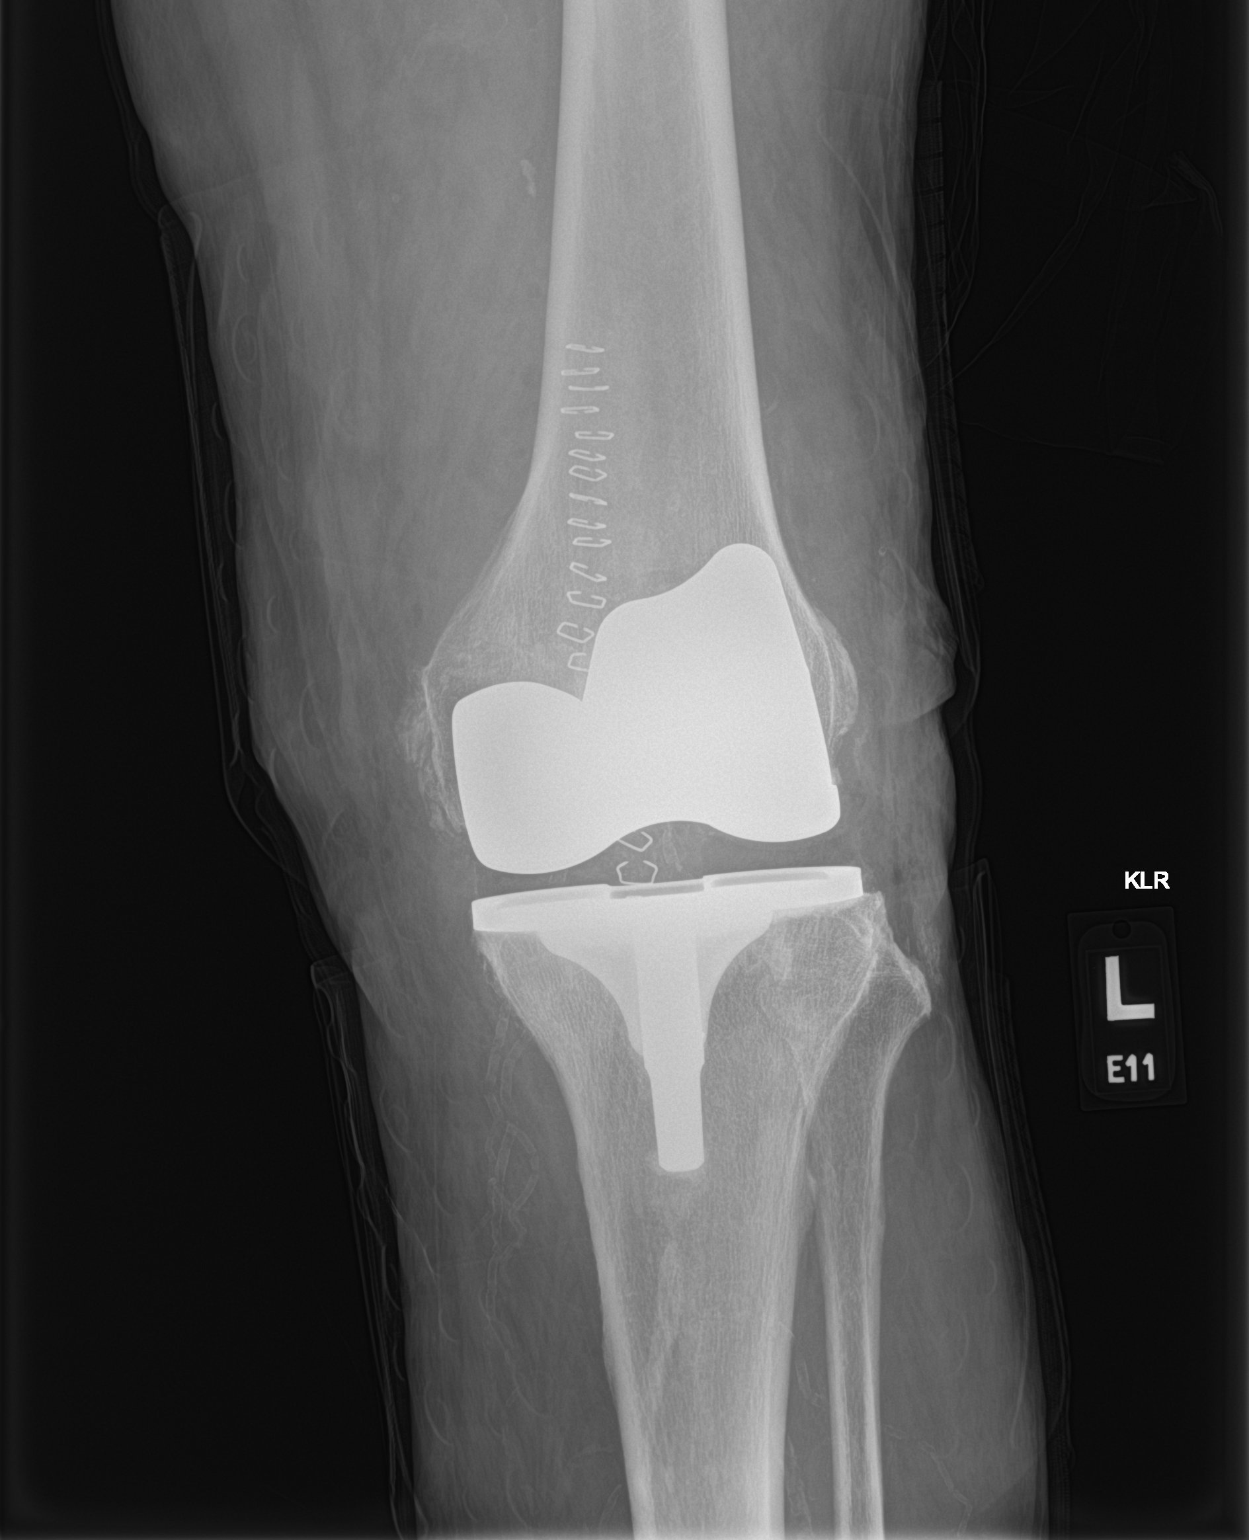

[knee lat]
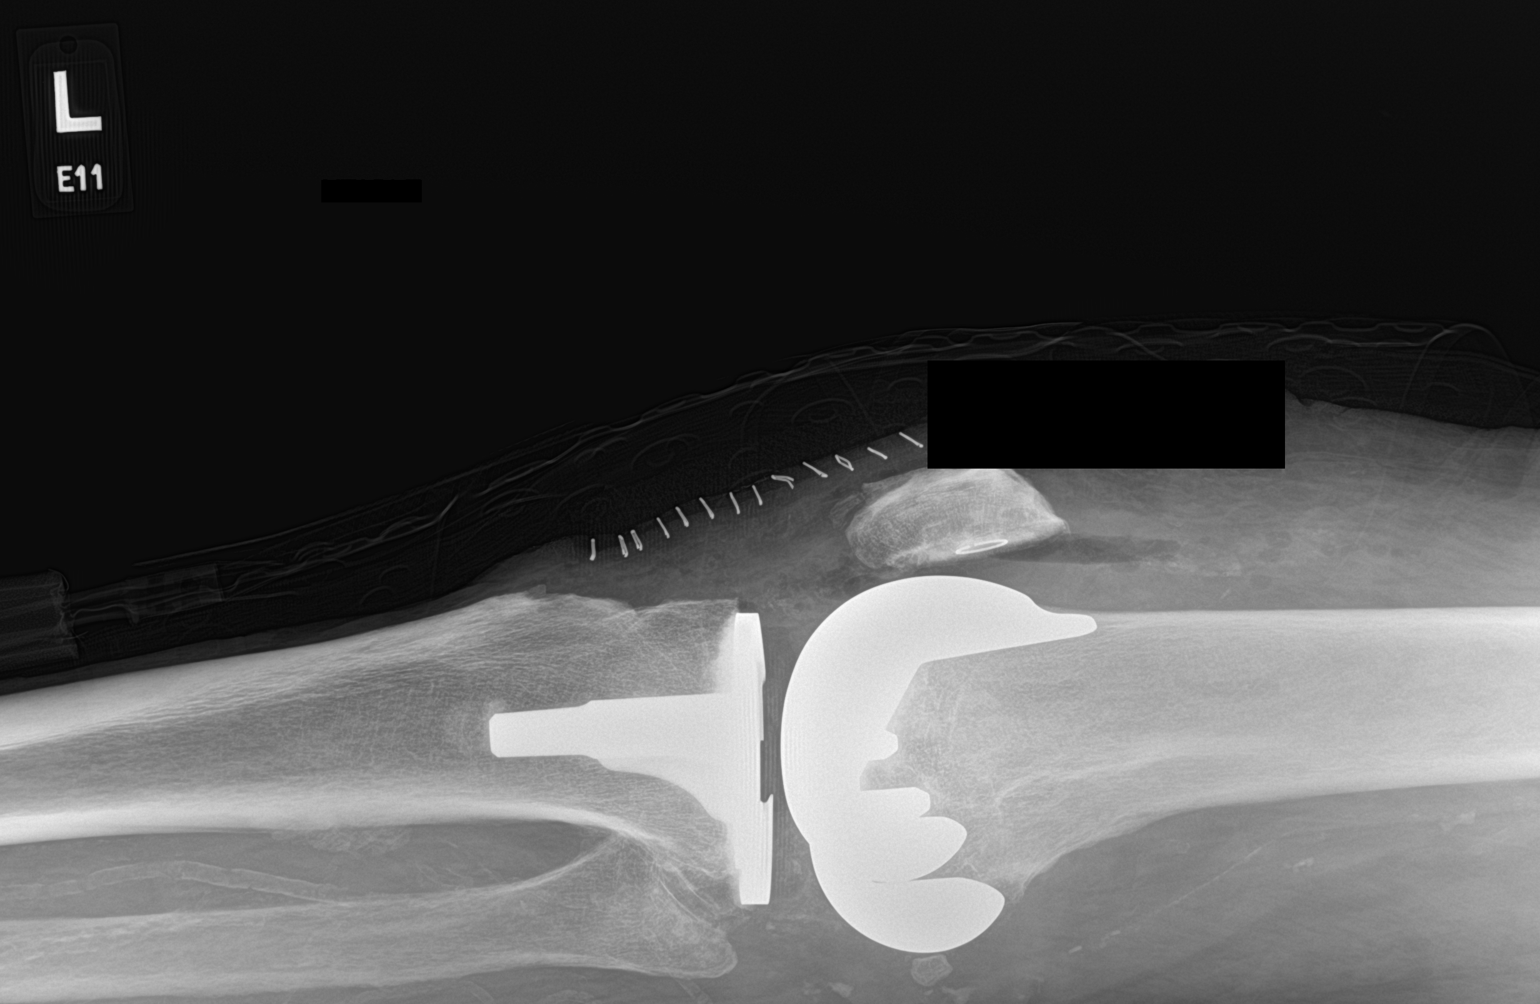

[2 of 2 positions shown; findings below may reference images not displayed]

FINDINGS: Left knee prosthesis is noted in satisfactory position. No acute
fracture or dislocation is noted. No soft tissue abnormality is seen
IMPRESSION: Status post left knee replacement

## 2022-03-15 ENCOUNTER — Ambulatory Visit (INDEPENDENT_AMBULATORY_CARE_PROVIDER_SITE_OTHER): Payer: Medicare Other

## 2022-03-15 VITALS — Wt 218.0 lb

## 2022-03-15 DIAGNOSIS — Z23 Encounter for immunization: Secondary | ICD-10-CM | POA: Insufficient documentation

## 2022-03-15 DIAGNOSIS — M25562 Pain in left knee: Secondary | ICD-10-CM | POA: Insufficient documentation

## 2022-03-15 DIAGNOSIS — B029 Zoster without complications: Secondary | ICD-10-CM | POA: Insufficient documentation

## 2022-03-15 DIAGNOSIS — Z0000 Encounter for general adult medical examination without abnormal findings: Secondary | ICD-10-CM

## 2022-03-15 DIAGNOSIS — I10 Essential (primary) hypertension: Secondary | ICD-10-CM | POA: Insufficient documentation

## 2022-03-15 DIAGNOSIS — R2689 Other abnormalities of gait and mobility: Secondary | ICD-10-CM | POA: Insufficient documentation

## 2022-03-15 DIAGNOSIS — Z950 Presence of cardiac pacemaker: Secondary | ICD-10-CM | POA: Insufficient documentation

## 2022-03-15 DIAGNOSIS — Z85828 Personal history of other malignant neoplasm of skin: Secondary | ICD-10-CM | POA: Insufficient documentation

## 2022-03-15 DIAGNOSIS — W19XXXA Unspecified fall, initial encounter: Secondary | ICD-10-CM | POA: Insufficient documentation

## 2022-03-15 DIAGNOSIS — D689 Coagulation defect, unspecified: Secondary | ICD-10-CM | POA: Insufficient documentation

## 2022-03-15 DIAGNOSIS — N400 Enlarged prostate without lower urinary tract symptoms: Secondary | ICD-10-CM | POA: Insufficient documentation

## 2022-03-15 DIAGNOSIS — Z719 Counseling, unspecified: Secondary | ICD-10-CM | POA: Insufficient documentation

## 2022-03-15 NOTE — Patient Instructions (Signed)
Grant Schmitt , Thank you for taking time to come for your Medicare Wellness Visit. I appreciate your ongoing commitment to your health goals. Please review the following plan we discussed and let me know if I can assist you in the future.   Screening recommendations/referrals: Colonoscopy: aged out Recommended yearly ophthalmology/optometry visit for glaucoma screening and checkup Recommended yearly dental visit for hygiene and checkup  Vaccinations: Influenza vaccine: 03/10/21 Pneumococcal vaccine: 03/10/21 Tdap vaccine: 08/28/13 Shingles vaccine: Shingrix 12/24/18, 02/25/19   Covid-19: 07/12/19, 08/02/19, 03/18/20, 10/11/20, 02/28/21  Advanced directives: no  Conditions/risks identified: none  Next appointment: Follow up in one year for your annual wellness visit. 03/19/23 @ 8:45 am by phone  Preventive Care 65 Years and Older, Male Preventive care refers to lifestyle choices and visits with your health care provider that can promote health and wellness. What does preventive care include? A yearly physical exam. This is also called an annual well check. Dental exams once or twice a year. Routine eye exams. Ask your health care provider how often you should have your eyes checked. Personal lifestyle choices, including: Daily care of your teeth and gums. Regular physical activity. Eating a healthy diet. Avoiding tobacco and drug use. Limiting alcohol use. Practicing safe sex. Taking low doses of aspirin every day. Taking vitamin and mineral supplements as recommended by your health care provider. What happens during an annual well check? The services and screenings done by your health care provider during your annual well check will depend on your age, overall health, lifestyle risk factors, and family history of disease. Counseling  Your health care provider may ask you questions about your: Alcohol use. Tobacco use. Drug use. Emotional well-being. Home and relationship  well-being. Sexual activity. Eating habits. History of falls. Memory and ability to understand (cognition). Work and work Statistician. Screening  You may have the following tests or measurements: Height, weight, and BMI. Blood pressure. Lipid and cholesterol levels. These may be checked every 5 years, or more frequently if you are over 73 years old. Skin check. Lung cancer screening. You may have this screening every year starting at age 82 if you have a 30-pack-year history of smoking and currently smoke or have quit within the past 15 years. Fecal occult blood test (FOBT) of the stool. You may have this test every year starting at age 13. Flexible sigmoidoscopy or colonoscopy. You may have a sigmoidoscopy every 5 years or a colonoscopy every 10 years starting at age 7. Prostate cancer screening. Recommendations will vary depending on your family history and other risks. Hepatitis C blood test. Hepatitis B blood test. Sexually transmitted disease (STD) testing. Diabetes screening. This is done by checking your blood sugar (glucose) after you have not eaten for a while (fasting). You may have this done every 1-3 years. Abdominal aortic aneurysm (AAA) screening. You may need this if you are a current or former smoker. Osteoporosis. You may be screened starting at age 22 if you are at high risk. Talk with your health care provider about your test results, treatment options, and if necessary, the need for more tests. Vaccines  Your health care provider may recommend certain vaccines, such as: Influenza vaccine. This is recommended every year. Tetanus, diphtheria, and acellular pertussis (Tdap, Td) vaccine. You may need a Td booster every 10 years. Zoster vaccine. You may need this after age 13. Pneumococcal 13-valent conjugate (PCV13) vaccine. One dose is recommended after age 18. Pneumococcal polysaccharide (PPSV23) vaccine. One dose is recommended after age 74.  Talk to your health care  provider about which screenings and vaccines you need and how often you need them. This information is not intended to replace advice given to you by your health care provider. Make sure you discuss any questions you have with your health care provider. Document Released: 07/02/2015 Document Revised: 02/23/2016 Document Reviewed: 04/06/2015 Elsevier Interactive Patient Education  2017 [place] Prevention in the Home Falls can cause injuries. They can happen to people of all ages. There are many things you can do to make your home safe and to help prevent falls. What can I do on the outside of my home? Regularly fix the edges of walkways and driveways and fix any cracks. Remove anything that might make you trip as you walk through a door, such as a raised step or threshold. Trim any bushes or trees on the path to your home. Use bright outdoor lighting. Clear any walking paths of anything that might make someone trip, such as rocks or tools. Regularly check to see if handrails are loose or broken. Make sure that both sides of any steps have handrails. Any raised decks and porches should have guardrails on the edges. Have any leaves, snow, or ice cleared regularly. Use sand or salt on walking paths during winter. Clean up any spills in your garage right away. This includes oil or grease spills. What can I do in the bathroom? Use night lights. Install grab bars by the toilet and in the tub and shower. Do not use towel bars as grab bars. Use non-skid mats or decals in the tub or shower. If you need to sit down in the shower, use a plastic, non-slip stool. Keep the floor dry. Clean up any water that spills on the floor as soon as it happens. Remove soap buildup in the tub or shower regularly. Attach bath mats securely with double-sided non-slip rug tape. Do not have throw rugs and other things on the floor that can make you trip. What can I do in the bedroom? Use night lights. Make  sure that you have a light by your bed that is easy to reach. Do not use any sheets or blankets that are too big for your bed. They should not hang down onto the floor. Have a firm chair that has side arms. You can use this for support while you get dressed. Do not have throw rugs and other things on the floor that can make you trip. What can I do in the kitchen? Clean up any spills right away. Avoid walking on wet floors. Keep items that you use a lot in easy-to-reach places. If you need to reach something above you, use a strong step stool that has a grab bar. Keep electrical cords out of the way. Do not use floor polish or wax that makes floors slippery. If you must use wax, use non-skid floor wax. Do not have throw rugs and other things on the floor that can make you trip. What can I do with my stairs? Do not leave any items on the stairs. Make sure that there are handrails on both sides of the stairs and use them. Fix handrails that are broken or loose. Make sure that handrails are as long as the stairways. Check any carpeting to make sure that it is firmly attached to the stairs. Fix any carpet that is loose or worn. Avoid having throw rugs at the top or bottom of the stairs. If you do have throw  rugs, attach them to the floor with carpet tape. Make sure that you have a light switch at the top of the stairs and the bottom of the stairs. If you do not have them, ask someone to add them for you. What else can I do to help prevent falls? Wear shoes that: Do not have high heels. Have rubber bottoms. Are comfortable and fit you well. Are closed at the toe. Do not wear sandals. If you use a stepladder: Make sure that it is fully opened. Do not climb a closed stepladder. Make sure that both sides of the stepladder are locked into place. Ask someone to hold it for you, if possible. Clearly mark and make sure that you can see: Any grab bars or handrails. First and last steps. Where the  edge of each step is. Use tools that help you move around (mobility aids) if they are needed. These include: Canes. Walkers. Scooters. Crutches. Turn on the lights when you go into a dark area. Replace any light bulbs as soon as they burn out. Set up your furniture so you have a clear path. Avoid moving your furniture around. If any of your floors are uneven, fix them. If there are any pets around you, be aware of where they are. Review your medicines with your doctor. Some medicines can make you feel dizzy. This can increase your chance of falling. Ask your doctor what other things that you can do to help prevent falls. This information is not intended to replace advice given to you by your health care provider. Make sure you discuss any questions you have with your health care provider. Document Released: 04/01/2009 Document Revised: 11/11/2015 Document Reviewed: 07/10/2014 Elsevier Interactive Patient Education  2017 [employer].

## 2022-03-15 NOTE — Progress Notes (Signed)
Virtual Visit via Telephone Note  I connected with  Grant Schmitt on 03/15/22 at 10:45 AM EDT by telephone and verified that I am speaking with the correct person using two identifiers.  Location: Patient: home Provider: [facility] Persons participating in the virtual visit: [place]   I discussed the limitations, risks, security and privacy concerns of performing an evaluation and management service by telephone and the availability of in person appointments. The patient expressed understanding and agreed to proceed.  Interactive audio and video telecommunications were attempted between this nurse and patient, however failed, due to patient having technical difficulties OR patient did not have access to video capability.  We continued and completed visit with audio only.  Some vital signs may be absent or patient reported.   Dionisio David, LPN  Subjective:   Grant Schmitt is a 88 y.o. male who presents for Medicare Annual/Subsequent preventive examination.  Review of Systems     Cardiac Risk Factors include: advanced age (>57mn, >>38women);hypertension;dyslipidemia;male gender     Objective:    There were no vitals filed for this visit. There is no height or weight on file to calculate BMI.     03/15/2022   10:49 AM 03/12/2021   10:51 AM 11/29/2020    6:36 PM 11/29/2020   10:42 AM 11/19/2020   10:56 AM 11/25/2019   11:43 AM 11/20/2018   11:07 AM  Advanced Directives  Does Patient Have a Medical Advance Directive? No Yes Yes Yes Yes Yes Yes  Type of Advance Directive  Living will;Out of facility DNR (pink MOST or yellow form) Living will Living will  HBoazLiving will HRomaLiving will  Does patient want to make changes to medical advance directive?   Yes (Inpatient - patient requests chaplain consult to change a medical advance directive) No - Patient declined     Copy of [place] Chart?  Yes -  validated most recent copy scanned in chart (See row information)    Yes - validated most recent copy scanned in chart (See row information) Yes - validated most recent copy scanned in chart (See row information)  Would patient like information on creating a medical advance directive? No - Patient declined          Current Medications (verified) Outpatient Encounter Medications as of 03/15/2022  Medication Sig   allopurinol (ZYLOPRIM) 300 MG tablet TAKE 1 TABLET BY MOUTH DAILY   Ascorbic Acid (VITAMIN C) 1000 MG tablet Take 1,000 mg by mouth daily.   calcium carbonate (TUMS - DOSED IN MG ELEMENTAL CALCIUM) 500 MG chewable tablet Chew 1 tablet by mouth as needed for indigestion or heartburn.   Cholecalciferol (VITAMIN D3) 2000 units TABS Take 2,000 Units by mouth daily.    clotrimazole-betamethasone (LOTRISONE) lotion APPLY  LOTION TOPICALLY TWICE DAILY   losartan (COZAAR) 50 MG tablet Take 1 tablet (50 mg total) by mouth every morning.   methocarbamol (ROBAXIN) 500 MG tablet    metoprolol tartrate (LOPRESSOR) 50 MG tablet Take 1 tablet (50 mg total) by mouth daily.   simvastatin (ZOCOR) 20 MG tablet Take 1 tablet (20 mg total) by mouth daily.   tamsulosin (FLOMAX) 0.4 MG CAPS capsule Take 1 capsule (0.4 mg total) by mouth daily.   XARELTO 20 MG TABS tablet Take 1 tablet (20 mg total) by mouth daily.   docusate sodium (COLACE) 100 MG capsule Take 1 capsule (100 mg total) by mouth 2 (two) times daily. (  Patient not taking: Reported on 03/15/2022)   HYDROcodone-acetaminophen (NORCO/VICODIN) 5-325 MG tablet hydrocodone 5 mg-acetaminophen 325 mg tablet  Take 1 tablet every 6 hours by oral route as needed. (Patient not taking: Reported on 03/15/2022)   [DISCONTINUED] losartan (COZAAR) 50 MG tablet Take 1 tablet by mouth daily.   No facility-administered encounter medications on file as of 03/15/2022.    Allergies (verified) Patient has no known allergies.   History: Past Medical History:   Diagnosis Date   A-fib ([place])    Allergy    Aortic insufficiency with aortic stenosis    Arthritis    Cardiomyopathy, secondary ([place])    Chronic anticoagulation    Rivaroxaban   GERD (gastroesophageal reflux disease)    occ-tums prn   Gout    H/O hemorrhoidectomy 1984   History of chickenpox    Hyperlipidemia    Hypertension    Pacemaker    Sick sinus syndrome ([place])    Skin cancer    Sleep apnea    does not use cpap    Tricuspid regurgitation    Vertigo    Past Surgical History:  Procedure Laterality Date   CATARACT EXTRACTION W/ INTRAOCULAR LENS IMPLANT Left    COLONOSCOPY     COLONOSCOPY W/ POLYPECTOMY     EYE SURGERY     hemorrhoidectomy  1984   IMPLANTABLE CARDIOVERTER DEFIBRILLATOR (ICD) GENERATOR CHANGE Left 06/26/2018   Procedure: SINGLE CHAMBER BATTERY CHANGEOUT;  Surgeon: Isaias Cowman, MD;  Location: [facility];  Service: Cardiovascular;  Laterality: Left;   INSERT / REPLACE / REMOVE PACEMAKER  2012   MOHS SURGERY Right 05/14/2018   ear. Dr. Karin Golden [facility]   REPLACEMENT TOTAL KNEE Right 2005   TOTAL KNEE ARTHROPLASTY Left 11/29/2020   Procedure: TOTAL KNEE ARTHROPLASTY;  Surgeon: Lovell Sheehan, MD;  Location: [facility];  Service: Orthopedics;  Laterality: Left;   History reviewed. No pertinent family history. Social History   Socioeconomic History   Marital status: Widowed    Spouse name: Not on file   Number of children: 2   Years of education: 103   Highest education level: 12th grade  Occupational History   Occupation: Retired  Tobacco Use   Smoking status: Former    Packs/day: 1.00    Years: 20.00    Total pack years: 20.00    Types: Cigarettes    Quit date: 06/19/1958    Years since quitting: 63.7   Smokeless tobacco: Never  Vaping Use   Vaping Use: Never used  Substance and Sexual Activity   Alcohol use: Yes    Alcohol/week: 0.0 - 1.0 standard drinks of alcohol   Drug use: No    Comment: occ beer   Sexual activity: Not  on file  Other Topics Concern   Not on file  Social History Narrative   Lives at [facility] Strain: Low Risk  (03/15/2022)   Overall Financial Resource Strain (CARDIA)    Difficulty of Paying Living Expenses: Not hard at all  Food Insecurity: No Food Insecurity (03/15/2022)   Hunger Vital Sign    Worried About Running Out of Food in the Last Year: Never true    Ran Out of Food in the Last Year: Never true  Transportation Needs: No Transportation Needs (03/15/2022)   PRAPARE - Hydrologist (Medical): No    Lack of Transportation (Non-Medical): No  Physical Activity: Insufficiently Active (03/15/2022)  Exercise Vital Sign    Days of Exercise per Week: 3 days    Minutes of Exercise per Session: 30 min  Stress: No Stress Concern Present (03/15/2022)   [place]    Feeling of Stress : Not at all  Social Connections: Socially Isolated (03/15/2022)   Social Connection and Isolation Panel [NHANES]    Frequency of Communication with Friends and Family: More than three times a week    Frequency of Social Gatherings with Friends and Family: Once a week    Attends Religious Services: Never    Marine scientist or Organizations: No    Attends Music therapist: Not on file    Marital Status: Widowed    Tobacco Counseling Counseling given: Not Answered   Clinical Intake:  Pre-visit preparation completed: Yes  Pain : No/denies pain     Diabetes: No  How often do you need to have someone help you when you read instructions, pamphlets, or other written materials from your doctor or pharmacy?: 1 - Never  Diabetic?no  Interpreter Needed?: No  Information entered by :: Kirke Shaggy, LPN   Activities of Daily Living    03/15/2022   10:50 AM 07/29/2021    9:48 AM  In your present state of health, do you have any  difficulty performing the following activities:  Hearing? 0 0  Vision? 0 0  Difficulty concentrating or making decisions? 0 0  Walking or climbing stairs? 1 1  Dressing or bathing? 0 0  Doing errands, shopping? 0 0  Preparing Food and eating ? N   Using the Toilet? N   In the past six months, have you accidently leaked urine? N   Do you have problems with loss of bowel control? N   Managing your Medications? N   Managing your Finances? N   Housekeeping or managing your Housekeeping? N     Patient Care Team: Birdie Sons, MD as PCP - General (Family Medicine) Ubaldo Glassing Javier Docker, MD as Consulting Physician (Cardiology) Dingeldein, Remo Lipps, MD as Consulting Physician (Ophthalmology) Will Bonnet, MD as Referring Physician (Internal Medicine) Lovell Sheehan, MD as Consulting Physician (Orthopedic Surgery)  Indicate any recent Medical Services you may have received from other than [facility] providers in the past year (date may be approximate).     Assessment:   This is a routine wellness examination for [place].  Hearing/Vision screen Hearing Screening - Comments:: No aids Vision Screening - Comments:: Wears glasses- [facility]  Dietary issues and exercise activities discussed: Current Exercise Habits: Home exercise routine, Type of exercise: walking, Time (Minutes): 30, Frequency (Times/Week): 3, Weekly Exercise (Minutes/Week): 90, Intensity: Mild   Goals Addressed             This Visit's Progress    DIET - EAT MORE FRUITS AND VEGETABLES         Depression Screen    03/15/2022   10:48 AM 02/09/2022    1:00 PM 07/29/2021    9:48 AM 03/12/2021   10:47 AM 11/25/2019   11:42 AM 02/17/2019    9:26 AM 11/20/2018   11:07 AM  PHQ 2/9 Scores  PHQ - 2 Score 0 0 0 0 0 0 0  PHQ- 9 Score 0  2   2 0    Fall Risk    03/15/2022   10:50 AM 07/29/2021    9:49 AM 03/12/2021   10:46 AM 11/25/2019   11:44 AM  02/17/2019    9:27 AM  Fall Risk   Falls in the past year? 1 0 0 0 0   Number falls in past yr: 0 0 0 0   Injury with Fall? 1 0 0 0   Risk for fall due to : History of fall(s) No Fall Risks     Follow up Falls evaluation completed;Falls prevention discussed Falls evaluation completed Education provided  Falls evaluation completed    FALL RISK PREVENTION PERTAINING TO THE HOME:  Any stairs in or around the home? No  If so, are there any without handrails? No  Home free of loose throw rugs in walkways, pet beds, electrical cords, etc? Yes  Adequate lighting in your home to reduce risk of falls? Yes   ASSISTIVE DEVICES UTILIZED TO PREVENT FALLS:  Life alert? Yes  Use of a cane, walker or w/c? No  Grab bars in the bathroom? Yes  Shower chair or bench in shower? Yes  Elevated toilet seat or a handicapped toilet? Yes   Cognitive Function:        03/15/2022   10:52 AM 09/01/2016    9:47 AM  6CIT Screen  What Year? 0 points 0 points  What month? 0 points 0 points  What time? 0 points 0 points  Count back from 20 0 points 0 points  Months in reverse 0 points 0 points  Repeat phrase 2 points 0 points  Total Score 2 points 0 points    Immunizations Immunization History  Administered Date(s) Administered   Fluad Quad(high Dose 65+) 02/17/2019, 03/10/2020   Influenza, High Dose Seasonal PF 03/11/2018, 03/10/2021   Influenza-Unspecified 02/05/2012, 03/22/2014, 03/15/2015, 03/31/2016   PFIZER Comirnaty(Gray Top)Covid-19 Tri-Sucrose Vaccine 10/11/2020   PFIZER(Purple Top)SARS-COV-2 Vaccination 07/12/2019, 08/02/2019, 03/18/2020   Pfizer Covid-19 Vaccine Bivalent Booster 29yr & up 02/28/2021   Pneumococcal Conjugate-13 08/28/2013, 06/23/2014   Pneumococcal Polysaccharide-23 05/19/2002, 06/03/2002, 10/22/2017, 03/10/2021   Tdap 04/21/2011, 08/28/2013, 02/21/2022   Zoster Recombinat (Shingrix) 12/24/2018, 02/25/2019   Zoster, Live 08/21/2011    TDAP status: Up to date  Flu Vaccine status: Up to date  Pneumococcal vaccine status: Up to  date  Covid-19 vaccine status: Completed vaccines  Qualifies for Shingles Vaccine? Yes   Zostavax completed No   Shingrix Completed?: Yes  Screening Tests Health Maintenance  Topic Date Due   COVID-19 Vaccine (6 - Pfizer risk series) 04/25/2021   INFLUENZA VACCINE  01/17/2022   TETANUS/TDAP  02/22/2032   Pneumonia Vaccine 88 Years old  Completed   Zoster Vaccines- Shingrix  Completed   HPV VACCINES  Aged Out    Health Maintenance  Health Maintenance Due  Topic Date Due   COVID-19 Vaccine (6 - Pfizer risk series) 04/25/2021   INFLUENZA VACCINE  01/17/2022    Colorectal cancer screening: No longer required.   Lung Cancer Screening: (Low Dose CT Chest recommended if Age 86-80years, 30 pack-year currently smoking OR have quit w/in 15years.) does not qualify.   Additional Screening:  Hepatitis C Screening: does not qualify; Completed no  Vision Screening: Recommended annual ophthalmology exams for early detection of glaucoma and other disorders of the eye. Is the patient up to date with their annual eye exam?  Yes  Who is the provider or what is the name of the office in which the patient attends annual eye exams[place] [facility] is not established with a provider, would they like to be referred to a provider to establish care? No .   Dental Screening:  Recommended annual dental exams for proper oral hygiene  Community Resource Referral / Chronic Care Management: CRR required this visit?  No   CCM required this visit?  No      Plan:     I have personally reviewed and noted the following in the patient's chart:   Medical and social history Use of alcohol, tobacco or illicit drugs  Current medications and supplements including opioid prescriptions. Patient is not currently taking opioid prescriptions. Functional ability and status Nutritional status Physical activity Advanced directives List of other physicians Hospitalizations, surgeries, and ER visits in  previous 12 months Vitals Screenings to include cognitive, depression, and falls Referrals and appointments  In addition, I have reviewed and discussed with patient certain preventive protocols, quality metrics, and best practice recommendations. A written personalized care plan for preventive services as well as general preventive health recommendations were provided to patient.     Dionisio David, LPN   11/27/6433   Nurse Notes: none

## 2022-03-20 DIAGNOSIS — S82015A Nondisplaced osteochondral fracture of left patella, initial encounter for closed fracture: Secondary | ICD-10-CM | POA: Diagnosis not present

## 2022-04-17 ENCOUNTER — Ambulatory Visit
Admission: RE | Admit: 2022-04-17 | Discharge: 2022-04-17 | Disposition: A | Discharge status: Home / Self Care | Payer: Medicare Other | Source: Ambulatory Visit | Attending: Physician Assistant | Admitting: Physician Assistant

## 2022-04-17 ENCOUNTER — Ambulatory Visit (INDEPENDENT_AMBULATORY_CARE_PROVIDER_SITE_OTHER): Payer: Medicare Other | Admitting: Physician Assistant

## 2022-04-17 ENCOUNTER — Encounter: Payer: Self-pay | Admitting: Physician Assistant

## 2022-04-17 VITALS — BP 94/56 | HR 82 | Temp 99.0°F | Resp 20

## 2022-04-17 DIAGNOSIS — R051 Acute cough: Secondary | ICD-10-CM

## 2022-04-17 DIAGNOSIS — R918 Other nonspecific abnormal finding of lung field: Secondary | ICD-10-CM | POA: Insufficient documentation

## 2022-04-17 DIAGNOSIS — J439 Emphysema, unspecified: Secondary | ICD-10-CM | POA: Diagnosis not present

## 2022-04-17 DIAGNOSIS — R059 Cough, unspecified: Secondary | ICD-10-CM | POA: Diagnosis not present

## 2022-04-17 DIAGNOSIS — R0600 Dyspnea, unspecified: Secondary | ICD-10-CM | POA: Diagnosis not present

## 2022-04-17 MED ORDER — BENZONATATE 100 MG PO CAPS: 100.0000 mg | ORAL_CAPSULE | Freq: Two times a day (BID) | ORAL | 0 refills | Status: DC | PRN

## 2022-04-17 MED ORDER — DOXYCYCLINE HYCLATE 100 MG PO TABS: 100.0000 mg | ORAL_TABLET | Freq: Two times a day (BID) | ORAL | 0 refills | Status: DC

## 2022-04-17 MED ORDER — AZITHROMYCIN 250 MG PO TABS: ORAL_TABLET | ORAL | 0 refills | Status: DC

## 2022-04-17 NOTE — Progress Notes (Signed)
I,Roshena L Chambers,acting as a Education administrator for [employer], PA-C.,have documented all relevant documentation on the behalf of Mardene Speak, PA-C,as directed by  [employer], PA-C while in the presence of [employer], PA-C.    Established patient visit   Patient: Grant Schmitt   DOB: 1928/07/14   88 y.o. Male  MRN: [id number] Visit Date: 04/17/2022  Today's healthcare provider: Mardene Speak, PA-C   Chief Complaint  Patient presents with   Cough   Subjective    Cough This is a new problem. Episode onset: 3 days ago. The problem has been unchanged. The cough is Productive of sputum (brown colored sputum). Associated symptoms include headaches, hemoptysis, rhinorrhea, shortness of breath and wheezing. Pertinent negatives include no chest pain, chills, fever, myalgias, postnasal drip or sore throat. Treatments tried: OTC cough medication and Tylenol. The treatment provided mild relief.     Medications: Outpatient Medications Prior to Visit  Medication Sig   allopurinol (ZYLOPRIM) 300 MG tablet TAKE 1 TABLET BY MOUTH DAILY   Ascorbic Acid (VITAMIN C) 1000 MG tablet Take 1,000 mg by mouth daily.   calcium carbonate (TUMS - DOSED IN MG ELEMENTAL CALCIUM) 500 MG chewable tablet Chew 1 tablet by mouth as needed for indigestion or heartburn.   Cholecalciferol (VITAMIN D3) 2000 units TABS Take 2,000 Units by mouth daily.    clotrimazole-betamethasone (LOTRISONE) lotion APPLY  LOTION TOPICALLY TWICE DAILY   docusate sodium (COLACE) 100 MG capsule Take 1 capsule (100 mg total) by mouth 2 (two) times daily.   HYDROcodone-acetaminophen (NORCO/VICODIN) 5-325 MG tablet    losartan (COZAAR) 50 MG tablet Take 1 tablet (50 mg total) by mouth every morning.   methocarbamol (ROBAXIN) 500 MG tablet    metoprolol tartrate (LOPRESSOR) 50 MG tablet Take 1 tablet (50 mg total) by mouth daily.   simvastatin (ZOCOR) 20 MG tablet Take 1 tablet (20 mg total) by mouth daily.   tamsulosin (FLOMAX) 0.4  MG CAPS capsule Take 1 capsule (0.4 mg total) by mouth daily.   XARELTO 20 MG TABS tablet Take 1 tablet (20 mg total) by mouth daily.   No facility-administered medications prior to visit.    Review of Systems  Constitutional:  Positive for fatigue. Negative for appetite change, chills and fever.  HENT:  Positive for congestion (nasal and chest congestion) and rhinorrhea. Negative for postnasal drip and sore throat.   Respiratory:  Positive for cough, hemoptysis, shortness of breath and wheezing. Negative for chest tightness.   Cardiovascular:  Negative for chest pain and palpitations.  Gastrointestinal:  Negative for abdominal pain, nausea and vomiting.  Musculoskeletal:  Negative for myalgias.  Neurological:  Positive for headaches.       Objective    BP (!) 94/56 (BP Location: Left Arm, Patient Position: Sitting, Cuff Size: Normal)   Pulse 82   Temp 99 F (37.2 C) (Oral)   SpO2 100% Comment: room air   Physical Exam Vitals reviewed.  Constitutional:      General: He is in acute distress.     Appearance: Normal appearance. He is not ill-appearing, toxic-appearing or diaphoretic.  HENT:     Head: Normocephalic and atraumatic.     Right Ear: There is impacted cerumen.     Left Ear: Tympanic membrane, ear canal and external ear normal.     Nose: Congestion (very mild) and rhinorrhea present.     Mouth/Throat:     Pharynx: Posterior oropharyngeal erythema present.  Eyes:     General: No  scleral icterus.       Right eye: No discharge.        Left eye: No discharge.     Extraocular Movements: Extraocular movements intact.     Conjunctiva/sclera: Conjunctivae normal.     Pupils: Pupils are equal, round, and reactive to light.  Cardiovascular:     Rate and Rhythm: Normal rate and regular rhythm.     Pulses: Normal pulses.     Heart sounds: Normal heart sounds. No murmur heard. Pulmonary:     Effort: Pulmonary effort is normal. No respiratory distress.     Breath sounds:  Rhonchi and rales present. No wheezing.  Abdominal:     General: Abdomen is flat. Bowel sounds are normal.     Palpations: Abdomen is soft.  Musculoskeletal:        General: Normal range of motion.     Cervical back: Normal range of motion and neck supple.     Right lower leg: No edema.     Left lower leg: No edema.  Lymphadenopathy:     Cervical: No cervical adenopathy.  Skin:    General: Skin is warm and dry.     Findings: No rash.  Neurological:     General: No focal deficit present.     Mental Status: He is alert and oriented to person, place, and time. Mental status is at baseline.  Psychiatric:        Behavior: Behavior normal.        Thought Content: Thought content normal.        Judgment: Judgment normal.      No results found for any visits on 04/17/22.  Assessment & Plan     1. Acute cough  X 3 days , with nasal congestion - DG Chest 2 View; Future - benzonatate (TESSALON) 100 MG capsule; Take 1 capsule (100 mg total) by mouth 2 (two) times daily as needed for cough.  Dispense: 20 capsule; Refill: 0 - doxycycline (VIBRA-TABS) 100 MG tablet; Take 1 tablet (100 mg total) by mouth 2 (two) times daily.  Dispense: 14 tablet; Refill: 0  2. Dyspnea, unspecified type With some chest congestion 2/2 cough - DG Chest 2 View; Future - benzonatate (TESSALON) 100 MG capsule; Take 1 capsule (100 mg total) by mouth 2 (two) times daily as needed for cough.  Dispense: 20 capsule; Refill: 0 - doxycycline (VIBRA-TABS) 100 MG tablet; Take 1 tablet (100 mg total) by mouth 2 (two) times daily.  Dispense: 14 tablet; Refill: 0  3. Lung field abnormal finding on examination Due to URI, bronchitis, pneumonia - DG Chest 2 View; Future - benzonatate (TESSALON) 100 MG capsule; Take 1 capsule (100 mg total) by mouth 2 (two) times daily as needed for cough.  Dispense: 20 capsule; Refill: 0 - doxycycline (VIBRA-TABS) 100 MG tablet; Take 1 tablet (100 mg total) by mouth 2 (two) times daily.   Dispense: 14 tablet; Refill: 0  FU PRN  The patient was advised to call back or seek an in-person evaluation if the symptoms worsen or if the condition fails to improve as anticipated.  I discussed the assessment and treatment plan with the patient. The patient was provided an opportunity to ask questions and all were answered. The patient agreed with the plan and demonstrated an understanding of the instructions.  The entirety of the information documented in the History of Present Illness, Review of Systems and Physical Exam were personally obtained by me. Portions of this information were initially documented  by the [place] and reviewed by me for thoroughness and accuracy.  Portions of this note were created using dictation software and may contain typographical errors.      Mardene Speak, PA-C  [facility] [phone] (phone) [phone] (fax)  [place]

## 2022-04-21 NOTE — Progress Notes (Signed)
Please, let pt know that his cxr was negative for acute cardiopulmonary disease. Please, asked if he is doing better

## 2022-05-30 ENCOUNTER — Other Ambulatory Visit: Payer: Self-pay | Admitting: Family Medicine

## 2022-05-30 DIAGNOSIS — N400 Enlarged prostate without lower urinary tract symptoms: Secondary | ICD-10-CM

## 2022-05-30 DIAGNOSIS — I10 Essential (primary) hypertension: Secondary | ICD-10-CM

## 2022-05-30 DIAGNOSIS — I495 Sick sinus syndrome: Secondary | ICD-10-CM | POA: Diagnosis not present

## 2022-05-30 NOTE — Telephone Encounter (Signed)
Unable to refill per protocol, request is too soon. Last refill 07/29/21 for 90 and 2 refills.  Requested Prescriptions  Pending Prescriptions Disp Refills   tamsulosin (FLOMAX) 0.4 MG CAPS capsule [Pharmacy Med Name: Tamsulosin HCl 0.4 MG Oral Capsule] 100 capsule 2    Sig: TAKE 1 CAPSULE BY MOUTH DAILY     Urology: Alpha-Adrenergic Blocker Failed - 05/30/2022 12:17 AM      Failed - PSA in normal range and within 360 days    No results found for: "LABPSA", "PSA", "PSA1", "ULTRAPSA"       Passed - Last BP in normal range    BP Readings from Last 1 Encounters:  04/17/22 (!) 94/56         Passed - Valid encounter within last 12 months    Recent Outpatient Visits           1 month ago Acute cough   [employer], [place], PA-C   4 months ago Hypercholesterolemia   [place], Kirstie Peri, MD   10 months ago Primary hypertension   [facility] Birdie Sons, MD   1 year ago Contact dermatitis, unspecified contact dermatitis type, unspecified trigger   [facility] Birdie Sons, MD   2 years ago Annual physical exam   [facility] Birdie Sons, MD       Future Appointments             In 2 months Fisher, Kirstie Peri, MD [facility]             losartan (COZAAR) 50 MG tablet [Pharmacy Med Name: Losartan Potassium 50 MG Oral Tablet] 100 tablet 2    Sig: TAKE 1 TABLET BY MOUTH IN THE  MORNING     Cardiovascular:  Angiotensin Receptor Blockers Passed - 05/30/2022 12:17 AM      Passed - Cr in normal range and within 180 days    Creatinine, Ser  Date Value Ref Range Status  01/30/2022 1.27 0.76 - 1.27 mg/dL Final         Passed - K in normal range and within 180 days    Potassium  Date Value Ref Range Status  01/30/2022 4.4 3.5 - 5.2 mmol/L Final         Passed - Patient is not pregnant      Passed - Last BP in normal range    BP Readings from Last 1  Encounters:  04/17/22 (!) 94/56         Passed - Valid encounter within last 6 months    Recent Outpatient Visits           1 month ago Acute cough   [facility] [place], [place], PA-C   4 months ago Hypercholesterolemia   [facility] Birdie Sons, MD   10 months ago Primary hypertension   [facility] Birdie Sons, MD   1 year ago Contact dermatitis, unspecified contact dermatitis type, unspecified trigger   [facility] Birdie Sons, MD   2 years ago Annual physical exam   [facility] Birdie Sons, MD       Future Appointments             In 2 months Fisher, Kirstie Peri, MD [facility], [place]

## 2022-06-27 DIAGNOSIS — I429 Cardiomyopathy, unspecified: Secondary | ICD-10-CM | POA: Diagnosis not present

## 2022-06-27 DIAGNOSIS — I10 Essential (primary) hypertension: Secondary | ICD-10-CM | POA: Diagnosis not present

## 2022-06-27 DIAGNOSIS — E785 Hyperlipidemia, unspecified: Secondary | ICD-10-CM | POA: Diagnosis not present

## 2022-06-27 DIAGNOSIS — I480 Paroxysmal atrial fibrillation: Secondary | ICD-10-CM | POA: Diagnosis not present

## 2022-06-27 DIAGNOSIS — Z950 Presence of cardiac pacemaker: Secondary | ICD-10-CM | POA: Diagnosis not present

## 2022-06-27 DIAGNOSIS — R001 Bradycardia, unspecified: Secondary | ICD-10-CM | POA: Diagnosis not present

## 2022-06-27 DIAGNOSIS — R0602 Shortness of breath: Secondary | ICD-10-CM | POA: Diagnosis not present

## 2022-06-27 DIAGNOSIS — I495 Sick sinus syndrome: Secondary | ICD-10-CM | POA: Diagnosis not present

## 2022-07-12 DIAGNOSIS — C44329 Squamous cell carcinoma of skin of other parts of face: Secondary | ICD-10-CM | POA: Diagnosis not present

## 2022-07-12 DIAGNOSIS — L821 Other seborrheic keratosis: Secondary | ICD-10-CM | POA: Diagnosis not present

## 2022-07-12 DIAGNOSIS — D485 Neoplasm of uncertain behavior of skin: Secondary | ICD-10-CM | POA: Diagnosis not present

## 2022-07-20 DIAGNOSIS — C44329 Squamous cell carcinoma of skin of other parts of face: Secondary | ICD-10-CM | POA: Diagnosis not present

## 2022-07-25 ENCOUNTER — Other Ambulatory Visit: Payer: Self-pay | Admitting: Family Medicine

## 2022-07-25 DIAGNOSIS — I10 Essential (primary) hypertension: Secondary | ICD-10-CM

## 2022-07-25 DIAGNOSIS — N400 Enlarged prostate without lower urinary tract symptoms: Secondary | ICD-10-CM

## 2022-07-31 ENCOUNTER — Ambulatory Visit (INDEPENDENT_AMBULATORY_CARE_PROVIDER_SITE_OTHER): Payer: Medicare Other | Admitting: Family Medicine

## 2022-07-31 ENCOUNTER — Encounter: Payer: Self-pay | Admitting: Family Medicine

## 2022-07-31 VITALS — BP 121/78 | HR 87 | Resp 98 | Wt 217.0 lb

## 2022-07-31 DIAGNOSIS — D649 Anemia, unspecified: Secondary | ICD-10-CM

## 2022-07-31 DIAGNOSIS — I10 Essential (primary) hypertension: Secondary | ICD-10-CM

## 2022-07-31 DIAGNOSIS — E7800 Pure hypercholesterolemia, unspecified: Secondary | ICD-10-CM

## 2022-07-31 DIAGNOSIS — I4891 Unspecified atrial fibrillation: Secondary | ICD-10-CM | POA: Diagnosis not present

## 2022-07-31 NOTE — Progress Notes (Signed)
Ar[place] Ponder [place],acting as a scribe for Lelon Huh, MD.,have documented all relevant documentation on the behalf of Lelon Huh, MD,as directed by  Lelon Huh, MD while in the presence of Lelon Huh, MD.     Established patient visit   Patient: Grant Schmitt   DOB: 1928-10-28   89 y.o. Male  MRN: [id number] Visit Date: 07/31/2022  Today's healthcare provider: Lelon Huh, MD   No chief complaint on file.  Subjective    HPI  Hypertension, follow-up  BP Readings from Last 3 Encounters:  07/31/22 121/78  04/17/22 (!) 94/56  01/30/22 111/67   Wt Readings from Last 3 Encounters:  07/31/22 217 lb (98.4 kg)  03/15/22 218 lb (98.9 kg)  01/30/22 218 lb (98.9 kg)     He was last seen for hypertension 6 months ago.  BP at that visit was as above. Management since that visit includes none.  He reports good compliance with treatment. He is not having side effects.  He is following a Regular diet. He is not exercising. He does not smoke.  Use of agents associated with hypertension: none.    Symptoms: No chest pain No chest pressure  No palpitations No syncope  No dyspnea No orthopnea  No paroxysmal nocturnal dyspnea No lower extremity edema   Pertinent labs Lab Results  Component Value Date   CHOL 126 01/30/2022   HDL 49 01/30/2022   LDLCALC 53 01/30/2022   TRIG 142 01/30/2022   CHOLHDL 2.6 01/30/2022   Lab Results  Component Value Date   NA 138 01/30/2022   K 4.4 01/30/2022   CREATININE 1.27 01/30/2022   EGFR 54 (L) 01/30/2022   GLUCOSE 110 (H) 01/30/2022   TSH 2.200 01/30/2022     The ASCVD Risk score (Arnett DK, et al., 2019) failed to calculate for the following reasons:   The 2019 ASCVD risk score is only valid for ages 63 to 13  --------------------------------------------------------------------------------------------------- Lipid/Cholesterol, Follow-up  Last lipid panel Other pertinent labs  Lab Results  Component Value Date    CHOL 126 01/30/2022   HDL 49 01/30/2022   LDLCALC 53 01/30/2022   TRIG 142 01/30/2022   CHOLHDL 2.6 01/30/2022   Lab Results  Component Value Date   ALT 9 07/29/2021   AST 19 07/29/2021   PLT 222 01/30/2022   TSH 2.200 01/30/2022     He was last seen for this 6 months ago.  Management since that visit includes none.  He reports good compliance with treatment. He is not having side effects.   Symptoms: No chest pain No chest pressure/discomfort  No dyspnea No lower extremity edema  No numbness or tingling of extremity No orthopnea  No palpitations No paroxysmal nocturnal dyspnea  No speech difficulty No syncope     The ASCVD Risk score (Arnett DK, et al., 2019) failed to calculate for the following reasons:   The 2019 ASCVD risk score is only valid for ages 54 to 82  ---------------------------------------------------------------------------------------------------   Medications: Outpatient Medications Prior to Visit  Medication Sig   allopurinol (ZYLOPRIM) 300 MG tablet TAKE 1 TABLET BY MOUTH DAILY   Ascorbic Acid (VITAMIN C) 1000 MG tablet Take 1,000 mg by mouth daily.   calcium carbonate (TUMS - DOSED IN MG ELEMENTAL CALCIUM) 500 MG chewable tablet Chew 1 tablet by mouth as needed for indigestion or heartburn.   Cholecalciferol (VITAMIN D3) 2000 units TABS Take 2,000 Units by mouth daily.    clotrimazole-betamethasone (LOTRISONE) lotion APPLY  LOTION TOPICALLY  TWICE DAILY   docusate sodium (COLACE) 100 MG capsule Take 1 capsule (100 mg total) by mouth 2 (two) times daily.   HYDROcodone-acetaminophen (NORCO/VICODIN) 5-325 MG tablet    losartan (COZAAR) 50 MG tablet TAKE 1 TABLET BY MOUTH IN THE  MORNING   methocarbamol (ROBAXIN) 500 MG tablet    metoprolol tartrate (LOPRESSOR) 50 MG tablet Take 1 tablet (50 mg total) by mouth daily.   simvastatin (ZOCOR) 20 MG tablet Take 1 tablet (20 mg total) by mouth daily.   tamsulosin (FLOMAX) 0.4 MG CAPS capsule TAKE 1 CAPSULE  BY MOUTH DAILY   XARELTO 20 MG TABS tablet Take 1 tablet (20 mg total) by mouth daily.   No facility-administered medications prior to visit.    Review of Systems  Constitutional:  Negative for appetite change, chills and fever.  Respiratory:  Negative for chest tightness, shortness of breath and wheezing.   Cardiovascular:  Negative for chest pain and palpitations.  Gastrointestinal:  Negative for abdominal pain, nausea and vomiting.        Objective    BP 121/78 (BP Location: Right Arm, Patient Position: Sitting, Cuff Size: Normal)   Pulse 87   Resp (!) 98   Wt 217 lb (98.4 kg)   BMI 30.27 kg/m    Physical Exam    General: Appearance:    Overweight male in no acute distress  Eyes:    PERRL, conjunctiva/corneas clear, EOM's intact       Lungs:     Clear to auscultation bilaterally, respirations unlabored  Heart:    Normal heart rate. Regular rhythm.  3/6 harsh, crescendo-decrescendo, systolic murmur at right upper sternal border   MS:   All extremities are intact.    Neurologic:   Awake, alert, oriented x 3. No apparent focal neurological defect.         Assessment & Plan     1. Primary hypertension Well controlled.  .ccm  - Comprehensive metabolic panel  2. Hypercholesterolemia He is tolerating simvastatin well with no adverse effects.   - Lipid panel  3. Atrial fibrillation, unspecified type (HCC) Paced rhythm. On DOAC. Rate well controlled.   4. Anemia, unspecified type  - CBC      The entirety of the information documented in the History of Present Illness, Review of Systems and Physical Exam were personally obtained by me. Portions of this information were initially documented by the CMA and reviewed by me for thoroughness and accuracy.     Lelon Huh, MD  [place] [phone] (phone) [phone] (fax)  [place]

## 2022-08-01 LAB — COMPREHENSIVE METABOLIC PANEL
ALT: 15 IU/L (ref 0–44)
AST: 21 IU/L (ref 0–40)
Albumin/Globulin Ratio: 1.8 (ref 1.2–2.2)
Albumin: 4.2 g/dL (ref 3.6–4.6)
Alkaline Phosphatase: 97 IU/L (ref 44–121)
BUN/Creatinine Ratio: 32 — ABNORMAL HIGH (ref 10–24)
BUN: 35 mg/dL (ref 10–36)
Bilirubin Total: 0.4 mg/dL (ref 0.0–1.2)
CO2: 22 mmol/L (ref 20–29)
Calcium: 10.2 mg/dL (ref 8.6–10.2)
Chloride: 102 mmol/L (ref 96–106)
Creatinine, Ser: 1.11 mg/dL (ref 0.76–1.27)
Globulin, Total: 2.4 g/dL (ref 1.5–4.5)
Glucose: 91 mg/dL (ref 70–99)
Potassium: 4.5 mmol/L (ref 3.5–5.2)
Sodium: 140 mmol/L (ref 134–144)
Total Protein: 6.6 g/dL (ref 6.0–8.5)
eGFR: 63 mL/min/{1.73_m2} (ref >59)

## 2022-08-01 LAB — CBC
Hematocrit: 38 % (ref 37.5–51.0)
Hemoglobin: 12.4 g/dL — ABNORMAL LOW (ref 13.0–17.7)
MCH: 32 pg (ref 26.6–33.0)
MCHC: 32.6 g/dL (ref 31.5–35.7)
MCV: 98 fL — ABNORMAL HIGH (ref 79–97)
Platelets: 176 10*3/uL (ref 150–450)
RBC: 3.88 x10E6/uL — ABNORMAL LOW (ref 4.14–5.80)
RDW: 13.1 % (ref 11.6–15.4)
WBC: 10.6 10*3/uL (ref 3.4–10.8)

## 2022-08-01 LAB — LIPID PANEL
Chol/HDL Ratio: 2.2 ratio (ref 0.0–5.0)
Cholesterol, Total: 127 mg/dL (ref 100–199)
HDL: 59 mg/dL (ref >39)
LDL Chol Calc (NIH): 51 mg/dL (ref 0–99)
Triglycerides: 90 mg/dL (ref 0–149)
VLDL Cholesterol Cal: 17 mg/dL (ref 5–40)

## 2022-08-10 ENCOUNTER — Other Ambulatory Visit: Payer: Self-pay | Admitting: Family Medicine

## 2022-08-10 DIAGNOSIS — Z8739 Personal history of other diseases of the musculoskeletal system and connective tissue: Secondary | ICD-10-CM

## 2022-09-01 ENCOUNTER — Other Ambulatory Visit: Payer: Self-pay | Admitting: Family Medicine

## 2022-09-01 DIAGNOSIS — I4891 Unspecified atrial fibrillation: Secondary | ICD-10-CM

## 2022-09-01 DIAGNOSIS — E7800 Pure hypercholesterolemia, unspecified: Secondary | ICD-10-CM

## 2022-09-04 NOTE — Telephone Encounter (Signed)
Unable to refill per protocol, Rx request is too soon. Last refill 07/29/21 for 90 and 4 refills. Next refill due in April.  Requested Prescriptions  Pending Prescriptions Disp Refills   simvastatin (ZOCOR) 20 MG tablet [Pharmacy Med Name: Simvastatin 20 MG Oral Tablet] 100 tablet 2    Sig: TAKE 1 TABLET BY MOUTH DAILY     Cardiovascular:  Antilipid - Statins Failed - 09/01/2022 10:39 PM      Failed - Lipid Panel in normal range within the last 12 months    Cholesterol, Total  Date Value Ref Range Status  07/31/2022 127 100 - 199 mg/dL Final   LDL Chol Calc (NIH)  Date Value Ref Range Status  07/31/2022 51 0 - 99 mg/dL Final   HDL  Date Value Ref Range Status  07/31/2022 59 >39 mg/dL Final   Triglycerides  Date Value Ref Range Status  07/31/2022 90 0 - 149 mg/dL Final         Passed - Patient is not pregnant      Passed - Valid encounter within last 12 months    Recent Outpatient Visits           1 month ago Primary hypertension   [place], Donald E, MD   4 months ago Acute cough   [place], [place], PA-C   7 months ago Hypercholesterolemia   [facility] Birdie Sons, MD   1 year ago Primary hypertension   [place], Donald E, MD   1 year ago Contact dermatitis, unspecified contact dermatitis type, unspecified trigger   [place], Donald E, MD               metoprolol tartrate (LOPRESSOR) 50 MG tablet [Pharmacy Med Name: Metoprolol Tartrate 50 MG Oral Tablet] 100 tablet 2    Sig: TAKE 1 TABLET BY MOUTH DAILY     Cardiovascular:  Beta Blockers Passed - 09/01/2022 10:39 PM      Passed - Last BP in normal range    BP Readings from Last 1 Encounters:  07/31/22 121/78         Passed - Last Heart Rate in normal range    Pulse Readings from Last 1 Encounters:  07/31/22 87         Passed -  Valid encounter within last 6 months    Recent Outpatient Visits           1 month ago Primary hypertension   [place] Birdie Sons, MD   4 months ago Acute cough   [place], [place], PA-C   7 months ago Hypercholesterolemia   [facility] Birdie Sons, MD   1 year ago Primary hypertension   [place] Birdie Sons, MD   1 year ago Contact dermatitis, unspecified contact dermatitis type, unspecified trigger   [facility] Birdie Sons, MD

## 2022-09-14 ENCOUNTER — Ambulatory Visit: Payer: Self-pay

## 2022-09-14 DIAGNOSIS — R0981 Nasal congestion: Secondary | ICD-10-CM | POA: Diagnosis not present

## 2022-09-14 DIAGNOSIS — Z03818 Encounter for observation for suspected exposure to other biological agents ruled out: Secondary | ICD-10-CM | POA: Diagnosis not present

## 2022-09-14 DIAGNOSIS — J40 Bronchitis, not specified as acute or chronic: Secondary | ICD-10-CM | POA: Diagnosis not present

## 2022-09-14 NOTE — Telephone Encounter (Signed)
Chief Complaint: SOB, cough, chest pain Symptoms: above Frequency: last couple of days Pertinent Negatives: Patient denies  Disposition: [x] ED /[] Urgent Care (no appt availability in office) / [] Appointment(In office/virtual)/ []  [place] [facility]/ [] Home Care/ [] Refused Recommended Disposition /[] [place] [facility]/ []  Follow-up with PCP Additional Notes: PT called with SOB, cough and chest pain. PT had URI, but now has s/s above. Unclear if chest pain/ SOB  is related to cough. Recommended ED. PT was hesitant. Called FC, Nicole, No appts available this afternoon. Again recommended ED.  Unsure if pt will go.  Appt for tomorrow afternoon in office.    Reason for Disposition  Difficulty breathing  Answer Assessment - Initial Assessment Questions 1. RESPIRATORY STATUS: "Describe your breathing?" (e.g., wheezing, shortness of breath, unable to speak, severe coughing)      Wheezing 2. ONSET: "When did this breathing problem begin?"      Last couple of days 3. PATTERN "Does the difficult breathing come and go, or has it been constant since it started?"      Comes and goes 4. SEVERITY: "How bad is your breathing?" (e.g., mild, moderate, severe)    - MILD: No SOB at rest, mild SOB with walking, speaks normally in sentences, can lie down, no retractions, pulse < 100.    - MODERATE: SOB at rest, SOB with minimal exertion and prefers to sit, cannot lie down flat, speaks in phrases, mild retractions, audible wheezing, pulse 100-120.    - SEVERE: Very SOB at rest, speaks in single words, struggling to breathe, sitting hunched forward, retractions, pulse > 120      mild 5. RECURRENT SYMPTOM: "Have you had difficulty breathing before?" If Yes, ask: "When was the last time?" and "What happened that time?"       6. CARDIAC HISTORY: "Do you have any history of heart disease?" (e.g., heart attack, angina, bypass surgery, angioplasty)      no 7. LUNG HISTORY: "Do you have any history of lung  disease?"  (e.g., pulmonary embolus, asthma, emphysema)     no 8. CAUSE: "What do you think is causing the breathing problem?"      URI 9. OTHER SYMPTOMS: "Do you have any other symptoms? (e.g., dizziness, runny nose, cough, chest pain, fever)     Cough, Stomach hurts, Short of breath, runny nose  Answer Assessment - Initial Assessment Questions 1. LOCATION: "Where does it hurt?"       Chest  - more the left side 2. RADIATION: "Does the pain go anywhere else?" (e.g., into neck, jaw, arms, back)     no 3. ONSET: "When did the chest pain begin?" (Minutes, hours or days)      A couple days ago 4. PATTERN: "Does the pain come and go, or has it been constant since it started?"  "Does it get worse with exertion?"      constant 5. DURATION: "How long does it last" (e.g., seconds, minutes, hours)      6. SEVERITY: "How bad is the pain?"  (e.g., Scale 1-10; mild, moderate, or severe)    - MILD (1-3): doesn't interfere with normal activities     - MODERATE (4-7): interferes with normal activities or awakens from sleep    - SEVERE (8-10): excruciating pain, unable to do any normal activities       7-8/10 7. CARDIAC RISK FACTORS: "Do you have any history of heart problems or risk factors for heart disease?" (e.g., angina, prior heart attack; diabetes, high blood pressure,  high cholesterol, smoker, or strong family history of heart disease)     no 8. PULMONARY RISK FACTORS: "Do you have any history of lung disease?"  (e.g., blood clots in lung, asthma, emphysema, birth control pills)     no 9. CAUSE: "What do you think is causing the chest pain?"     unsure 10. OTHER SYMPTOMS: "Do you have any other symptoms?" (e.g., dizziness, nausea, vomiting, sweating, fever, difficulty breathing, cough)       Cough  Protocols used: Breathing Difficulty-A-AH, Chest Pain-A-AH

## 2022-09-15 ENCOUNTER — Ambulatory Visit: Payer: Medicare Other | Admitting: Family Medicine

## 2022-10-02 ENCOUNTER — Other Ambulatory Visit: Payer: Self-pay | Admitting: Family Medicine

## 2022-10-02 DIAGNOSIS — I4891 Unspecified atrial fibrillation: Secondary | ICD-10-CM

## 2022-10-02 DIAGNOSIS — E7800 Pure hypercholesterolemia, unspecified: Secondary | ICD-10-CM

## 2022-10-28 ENCOUNTER — Other Ambulatory Visit: Payer: Self-pay | Admitting: Family Medicine

## 2022-10-28 DIAGNOSIS — I10 Essential (primary) hypertension: Secondary | ICD-10-CM

## 2022-10-28 DIAGNOSIS — N400 Enlarged prostate without lower urinary tract symptoms: Secondary | ICD-10-CM

## 2022-11-02 DIAGNOSIS — L821 Other seborrheic keratosis: Secondary | ICD-10-CM | POA: Diagnosis not present

## 2022-11-02 DIAGNOSIS — D1801 Hemangioma of skin and subcutaneous tissue: Secondary | ICD-10-CM | POA: Diagnosis not present

## 2022-11-02 DIAGNOSIS — L814 Other melanin hyperpigmentation: Secondary | ICD-10-CM | POA: Diagnosis not present

## 2022-11-02 DIAGNOSIS — Z08 Encounter for follow-up examination after completed treatment for malignant neoplasm: Secondary | ICD-10-CM | POA: Diagnosis not present

## 2022-11-02 DIAGNOSIS — L570 Actinic keratosis: Secondary | ICD-10-CM | POA: Diagnosis not present

## 2022-11-02 DIAGNOSIS — Z85828 Personal history of other malignant neoplasm of skin: Secondary | ICD-10-CM | POA: Diagnosis not present

## 2022-11-02 DIAGNOSIS — L728 Other follicular cysts of the skin and subcutaneous tissue: Secondary | ICD-10-CM | POA: Diagnosis not present

## 2022-12-06 ENCOUNTER — Emergency Department: Payer: Medicare Other

## 2022-12-06 ENCOUNTER — Emergency Department
Admission: EM | Admit: 2022-12-06 | Discharge: 2022-12-06 | Disposition: A | Discharge status: Home / Self Care | Payer: Medicare Other | Attending: Emergency Medicine | Admitting: Emergency Medicine

## 2022-12-06 ENCOUNTER — Other Ambulatory Visit: Payer: Self-pay

## 2022-12-06 DIAGNOSIS — K8020 Calculus of gallbladder without cholecystitis without obstruction: Secondary | ICD-10-CM | POA: Insufficient documentation

## 2022-12-06 DIAGNOSIS — K5730 Diverticulosis of large intestine without perforation or abscess without bleeding: Secondary | ICD-10-CM | POA: Diagnosis not present

## 2022-12-06 DIAGNOSIS — W19XXXA Unspecified fall, initial encounter: Secondary | ICD-10-CM | POA: Diagnosis not present

## 2022-12-06 DIAGNOSIS — S0003XA Contusion of scalp, initial encounter: Secondary | ICD-10-CM | POA: Insufficient documentation

## 2022-12-06 DIAGNOSIS — S41111A Laceration without foreign body of right upper arm, initial encounter: Secondary | ICD-10-CM | POA: Diagnosis not present

## 2022-12-06 DIAGNOSIS — Z043 Encounter for examination and observation following other accident: Secondary | ICD-10-CM | POA: Diagnosis not present

## 2022-12-06 DIAGNOSIS — R0781 Pleurodynia: Secondary | ICD-10-CM | POA: Insufficient documentation

## 2022-12-06 DIAGNOSIS — W010XXA Fall on same level from slipping, tripping and stumbling without subsequent striking against object, initial encounter: Secondary | ICD-10-CM | POA: Diagnosis not present

## 2022-12-06 DIAGNOSIS — S61011A Laceration without foreign body of right thumb without damage to nail, initial encounter: Secondary | ICD-10-CM | POA: Diagnosis not present

## 2022-12-06 DIAGNOSIS — Z7901 Long term (current) use of anticoagulants: Secondary | ICD-10-CM | POA: Diagnosis not present

## 2022-12-06 DIAGNOSIS — I10 Essential (primary) hypertension: Secondary | ICD-10-CM | POA: Insufficient documentation

## 2022-12-06 DIAGNOSIS — S60931A Unspecified superficial injury of right thumb, initial encounter: Secondary | ICD-10-CM | POA: Diagnosis present

## 2022-12-06 DIAGNOSIS — I700 Atherosclerosis of aorta: Secondary | ICD-10-CM | POA: Insufficient documentation

## 2022-12-06 DIAGNOSIS — S51011A Laceration without foreign body of right elbow, initial encounter: Secondary | ICD-10-CM | POA: Diagnosis not present

## 2022-12-06 DIAGNOSIS — M4312 Spondylolisthesis, cervical region: Secondary | ICD-10-CM | POA: Diagnosis not present

## 2022-12-06 DIAGNOSIS — I4891 Unspecified atrial fibrillation: Secondary | ICD-10-CM | POA: Diagnosis not present

## 2022-12-06 DIAGNOSIS — S0990XA Unspecified injury of head, initial encounter: Secondary | ICD-10-CM | POA: Insufficient documentation

## 2022-12-06 LAB — CBC WITH DIFFERENTIAL/PLATELET
Abs Immature Granulocytes: 0.08 10*3/uL — ABNORMAL HIGH (ref 0.00–0.07)
Basophils Absolute: 0.1 10*3/uL (ref 0.0–0.1)
Basophils Relative: 0 %
Eosinophils Absolute: 0.1 10*3/uL (ref 0.0–0.5)
Eosinophils Relative: 1 %
HCT: 38.3 % — ABNORMAL LOW (ref 39.0–52.0)
Hemoglobin: 12.6 g/dL — ABNORMAL LOW (ref 13.0–17.0)
Immature Granulocytes: 1 %
Lymphocytes Relative: 6 %
Lymphs Abs: 1.1 10*3/uL (ref 0.7–4.0)
MCH: 32.3 pg (ref 26.0–34.0)
MCHC: 32.9 g/dL (ref 30.0–36.0)
MCV: 98.2 fL (ref 80.0–100.0)
Monocytes Absolute: 0.5 10*3/uL (ref 0.1–1.0)
Monocytes Relative: 3 %
Neutro Abs: 14.7 10*3/uL — ABNORMAL HIGH (ref 1.7–7.7)
Neutrophils Relative %: 89 %
Platelets: 217 10*3/uL (ref 150–400)
RBC: 3.9 MIL/uL — ABNORMAL LOW (ref 4.22–5.81)
RDW: 14.2 % (ref 11.5–15.5)
WBC: 16.5 10*3/uL — ABNORMAL HIGH (ref 4.0–10.5)
nRBC: 0 % (ref 0.0–0.2)

## 2022-12-06 LAB — BASIC METABOLIC PANEL
Anion gap: 12 (ref 5–15)
BUN: 34 mg/dL — ABNORMAL HIGH (ref 8–23)
CO2: 23 mmol/L (ref 22–32)
Calcium: 9.9 mg/dL (ref 8.9–10.3)
Chloride: 102 mmol/L (ref 98–111)
Creatinine, Ser: 1.06 mg/dL (ref 0.61–1.24)
GFR, Estimated: >60 mL/min (ref >60)
Glucose, Bld: 130 mg/dL — ABNORMAL HIGH (ref 70–99)
Potassium: 4 mmol/L (ref 3.5–5.1)
Sodium: 137 mmol/L (ref 135–145)

## 2022-12-06 MED ORDER — MORPHINE SULFATE (PF) 4 MG/ML IV SOLN
4.0000 mg | Freq: Once | INTRAVENOUS | Status: AC
  Administered 2022-12-06: 4 mg via INTRAVENOUS
  Filled 2022-12-06: qty 1

## 2022-12-06 MED ORDER — LIDOCAINE 5 % EX PTCH
1.0000 | MEDICATED_PATCH | CUTANEOUS | Status: DC
  Administered 2022-12-06: 1 via TRANSDERMAL
  Filled 2022-12-06: qty 1

## 2022-12-06 MED ORDER — IOHEXOL 300 MG/ML  SOLN
100.0000 mL | Freq: Once | INTRAMUSCULAR | Status: AC | PRN
  Administered 2022-12-06: 100 mL via INTRAVENOUS

## 2022-12-06 MED ORDER — ACETAMINOPHEN 325 MG PO TABS
650.0000 mg | ORAL_TABLET | Freq: Once | ORAL | Status: AC
  Administered 2022-12-06: 650 mg via ORAL
  Filled 2022-12-06: qty 2

## 2022-12-06 NOTE — ED Provider Notes (Signed)
Assumed patient care from Cruz Condon.  CT chest, abdomen pelvis unremarkable aside from an incidental finding of a pulmonary nodule.  Patient was given an ambulatory referral to pulmonology and results were communicated.  All patient questions were answered.   Pia Mau [place], Cordelia Poche 12/06/22 2053    Chesley Noon, MD 12/07/22 1901

## 2022-12-06 NOTE — ED Provider Notes (Signed)
[facility] Provider Note    Event Date/Time   First MD Initiated Contact with Patient 12/06/22 1640     (approximate)   History   Fall   HPI  Grant Schmitt is a 89 y.o. male PMH of HTN, A-fib on blood thinners who presents after mechanical fall earlier today.  Patient tripped over a rug walking out of a building and fell on his right side.  Patient tried to catch himself with his right hand, but ultimately hit his head on the ground.  He denies any LOC, but endorses right sided rib pain and multiple abrasions.      Physical Exam   Triage Vital Signs: ED Triage Vitals  Enc Vitals Group     BP 12/06/22 1630 (!) 151/92     Pulse Rate 12/06/22 1630 82     Resp 12/06/22 1630 18     Temp 12/06/22 1630 98.2 F (36.8 C)     Temp src --      SpO2 12/06/22 1630 100 %     Weight 12/06/22 1628 212 lb (96.2 kg)     Height 12/06/22 1628 5\' 11"  (1.803 m)     Head Circumference --      Peak Flow --      Pain Score 12/06/22 1628 2     Pain Loc --      Pain Edu? --      Excl. in GC? --     Most recent vital signs: Vitals:   12/06/22 1630  BP: (!) 151/92  Pulse: 82  Resp: 18  Temp: 98.2 F (36.8 C)  SpO2: 100%    General: Awake, no distress.  CV:  Good peripheral perfusion. RRR. Resp:  Normal effort. Splinting. CTAB. Abd:  No distention. Very TTP along right side of abdomen and chest.  Other:  Skin tear to right elbow, 3 cm laceration to right thumb pad   ED Results / Procedures / Treatments   Labs (all labs ordered are listed, but only abnormal results are displayed) Labs Reviewed  CBC WITH DIFFERENTIAL/PLATELET - Abnormal; Notable for the following components:      Result Value   WBC 16.5 (*)    RBC 3.90 (*)    Hemoglobin 12.6 (*)    HCT 38.3 (*)    Neutro Abs 14.7 (*)    Abs Immature Granulocytes 0.08 (*)    All other components within normal limits  BASIC METABOLIC PANEL - Abnormal; Notable for the following components:    Glucose, Bld 130 (*)    BUN 34 (*)    All other components within normal limits    RADIOLOGY  Chest x-ray, Noncon head CT and cervical spine CT obtained.  I interpreted the images as well as reviewed the radiologist report.   PROCEDURES:  Critical Care performed: No  ..Laceration Repair  Date/Time: 12/06/2022 6:18 PM  Performed by: Cameron Ali, PA-C Authorized by: Cameron Ali, PA-C   Consent:    Consent obtained:  Verbal   Consent given by:  Patient   Risks, benefits, and alternatives were discussed: yes     Risks discussed:  Pain and poor cosmetic result Laceration details:    Length (cm):  3   Depth (mm):  2 Treatment:    Area cleansed with:  Saline   Amount of cleaning:  Standard   Irrigation solution:  Sterile saline   Irrigation method:  Tap Skin repair:    Repair method:  Tissue  adhesive Approximation:    Approximation:  Close Repair type:    Repair type:  Simple Post-procedure details:    Dressing:  Open (no dressing)    MEDICATIONS ORDERED IN ED: Medications  lidocaine (LIDODERM) 5 % 1 patch (1 patch Transdermal Patch Applied 12/06/22 1856)  acetaminophen (TYLENOL) tablet 650 mg (650 mg Oral Given 12/06/22 1741)  morphine (PF) 4 MG/ML injection 4 mg (4 mg Intravenous Given 12/06/22 1854)  iohexol (OMNIPAQUE) 300 MG/ML solution 100 mL (100 mLs Intravenous Contrast Given 12/06/22 2000)     IMPRESSION / MDM / ASSESSMENT AND PLAN / ED COURSE  I reviewed the triage vital signs and the nursing notes.                              Patient presented to the ED for evaluation after mechanical fall earlier today.  He landed on his right side hitting his head and chest as well as scraping his right elbow and cutting his right thumb. Differential diagnosis includes, but is not limited to, intracranial bleed, contusion, hematoma, laceration, abrasion, rib fracture, liver laceration.  Patient's presentation is most consistent with acute complicated  illness / injury requiring diagnostic workup.  Skin tear to the elbow was bandaged with nonadherent gauze and a bulky dressing.  I offered to stitch the laceration on patient's thumb but he preferred that I use Dermabond.  Risk benefits and alternatives were discussed with the patient and the procedure was completed without complication.  Noncon CT of head and neck were obtained in the ED today.  I interpreted the images as well as reviewed the radiologist report.  Was no acute intracranial abnormality or acute fracture or traumatic subluxation of the cervical spine.  There was evidence of a soft tissue hematoma along the right lateral frontal scalp but no underlying calvarial fracture.  Chest x-ray was also obtained to evaluate for rib fracture.  I interpreted the images as well as reviewed the radiologist report.  There were no fractures visualized, but patient does have cardiomegaly and degenerative changes of the shoulders.  Due to patient being exquisitely tender over the right side and him being on blood thinners, a CT chest abdomen and pelvis was ordered to evaluate for nondisplaced fractures and rule out any internal bleeding.   Patient was given Tylenol, a lidocaine patch and morphine for pain control while in the ED today.  Patient's CMP notable for elevated BUN, however based on chart review patient's BUN is elevated chronically.  CBC notable for anemia and elevated WBCs.  Patient's anemia is chronic and he also has a history of elevated white count.  At the time of shift change I was still waiting for patient to go to CT.  Care of patient was passed off to [facility], [place].      FINAL CLINICAL IMPRESSION(S) / ED DIAGNOSES   Final diagnoses:  Laceration of right thumb without foreign body without damage to nail, initial encounter  Scalp hematoma, initial encounter  Skin tear of elbow without complication, right, initial encounter     Rx / DC Orders   ED Discharge Orders      None        Note:  This document was prepared using Dragon voice recognition software and may include unintentional dictation errors.   Rosalee Kaufman 12/06/22 Argentina Donovan, MD 12/07/22 1901

## 2022-12-06 NOTE — ED Triage Notes (Signed)
Pt to ED from [facility] for fall, tripped over rug coming out of building. Hit head, no LOC. +blood thinners. Laceration to right thumb. Reports right rib pain.

## 2022-12-08 ENCOUNTER — Inpatient Hospital Stay: Payer: Medicare Other | Admitting: Family Medicine

## 2022-12-13 ENCOUNTER — Telehealth: Payer: Self-pay

## 2022-12-13 NOTE — Telephone Encounter (Signed)
    Transition Care Management Follow-up Telephone Call Date of discharge and from where: [facility] Schmitt 6/19 How have you been since you were released from the hospital? Pretty good Any questions or concerns? No  Items Reviewed: Did the pt receive and understand the discharge instructions provided? Yes  Medications obtained and verified? Yes  Other? No  Any new allergies since your discharge? No  Dietary orders reviewed? No Do you have support at home? Yes    Follow up appointments reviewed:  PCP Hospital f/u appt confirmed? Yes  Scheduled to see PCP on 6/278 @ . Specialist Hospital f/u appt confirmed? No  Scheduled to see  on  @ . Are transportation arrangements needed? No  If their condition worsens, is the pt aware to call PCP or go to the Emergency Dept.? Yes Was the patient provided with contact information for the PCP's office or ED? Yes Was to pt encouraged to call back with questions or concerns? Yes

## 2022-12-14 NOTE — Progress Notes (Signed)
I,Vanessa  Vital,acting as a Neurosurgeon for [employer], MD.,have documented all relevant documentation on the behalf of Ronnald Ramp, MD,as directed by  Ronnald Ramp, MD while in the presence of Ronnald Ramp, MD.   Established patient visit   Patient: Grant Schmitt   DOB: 1928-08-27   89 y.o. Male  MRN: [id number] Visit Date: 12/15/2022  Today's healthcare provider: Ronnald Ramp, MD   Chief Complaint  Patient presents with   Follow-up     Patient states that he has noticed the his vision in his right has blurred some. Is not sure if it could be from fall, does state to have soreness in back and ribcage. Other than than reports no other issues since fall.    Subjective     HPI     Follow-up    Additional comments:  Patient states that he has noticed the his vision in his right has blurred some. Is not sure if it could be from fall, does state to have soreness in back and ribcage. Other than than reports no other issues since fall.       Last edited by Lubertha Basque, CMA on 12/15/2022 10:53 AM.     ED Follow Up  Grant Schmitt was evaluated in the ED on 12/06/2022 for a fall causing Laceration of right thumb and hitting his head.  Significant labs included glucose of 130 and WBC 16  Imaging findings showed no acute intracranial abnormalities, no skull fracture or cervical spine abnormalities, patient noted to have soft tissue hematoma on the right scalp that has since resolved    No new medications were added     Today He states that he has had blurry vision in his right eye that he just noticed last night  Reports that the soreness in his chest is most bothersome on the right  He reports using a patch  Reports that he has been sleeping in his lift chair as it is easier for him to get up from than lying flat in the bed  He reports soreness with deep breaths but otherwise has no difficulty with breathing       Medications: Outpatient Medications Prior to Visit  Medication Sig   allopurinol (ZYLOPRIM) 300 MG tablet TAKE 1 TABLET BY MOUTH DAILY   Ascorbic Acid (VITAMIN C) 1000 MG tablet Take 1,000 mg by mouth daily.   Cholecalciferol (VITAMIN D3) 2000 units TABS Take 2,000 Units by mouth daily.    clotrimazole-betamethasone (LOTRISONE) lotion APPLY  LOTION TOPICALLY TWICE DAILY   docusate sodium (COLACE) 100 MG capsule Take 1 capsule (100 mg total) by mouth 2 (two) times daily.   losartan (COZAAR) 50 MG tablet TAKE 1 TABLET BY MOUTH IN THE  MORNING   metoprolol tartrate (LOPRESSOR) 50 MG tablet TAKE 1 TABLET BY MOUTH DAILY   simvastatin (ZOCOR) 20 MG tablet TAKE 1 TABLET BY MOUTH DAILY   tamsulosin (FLOMAX) 0.4 MG CAPS capsule TAKE 1 CAPSULE BY MOUTH DAILY   XARELTO 20 MG TABS tablet Take 1 tablet (20 mg total) by mouth daily.   [DISCONTINUED] calcium carbonate (TUMS - DOSED IN MG ELEMENTAL CALCIUM) 500 MG chewable tablet Chew 1 tablet by mouth as needed for indigestion or heartburn.   [DISCONTINUED] HYDROcodone-acetaminophen (NORCO/VICODIN) 5-325 MG tablet    [DISCONTINUED] methocarbamol (ROBAXIN) 500 MG tablet    No facility-administered medications prior to visit.    Review of Systems  Eyes:  Positive for visual disturbance.  Right eye has blurry vision       Objective    BP 122/82 (BP Location: Left Arm, Patient Position: Sitting, Cuff Size: Normal)   Pulse 75   Temp 97.8 F (36.6 C) (Oral)   Resp 13   Ht 5\' 11"  (1.803 m)   Wt 211 lb 8 oz (95.9 kg)   SpO2 96%   BMI 29.50 kg/m    Physical Exam Eyes:     General: Lids are normal. No allergic shiner or scleral icterus.       Right eye: No discharge.        Left eye: No discharge.     Extraocular Movements: Extraocular movements intact.     Conjunctiva/sclera:     Right eye: No exudate or hemorrhage.    Left eye: No exudate or hemorrhage. Cardiovascular:     Rate and Rhythm: Normal rate.     Heart sounds:  Murmur heard.  Pulmonary:     Effort: Pulmonary effort is normal. No respiratory distress.     Breath sounds: Normal breath sounds. No wheezing, rhonchi or rales.  Chest:     Chest wall: Tenderness present.  Abdominal:     General: Bowel sounds are normal.     Tenderness: There is abdominal tenderness.     Comments: Protuberant abdomen, tender to palpation on the right, normal BS   Musculoskeletal:     Comments: Right sided lower ribs are tender to palpation  There is no overlying skin change noted on exam  No lacerations on the chest nor abdomen  No bruising visualized today         No results found for any visits on 12/15/22.  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         The entirety of the information documented in the History of Present Illness, Review of Systems and Physical Exam were personally obtained by me. Portions of this information were initially documented by Lubertha Basque, CMA. I, Ronnald Ramp, MD have reviewed the documentation above for thoroughness and accuracy.     Ronnald Ramp, MD  [facility] [phone] (phone) [phone] (fax)  [facility]

## 2022-12-15 ENCOUNTER — Ambulatory Visit (INDEPENDENT_AMBULATORY_CARE_PROVIDER_SITE_OTHER): Payer: Medicare Other | Admitting: Family Medicine

## 2022-12-15 ENCOUNTER — Encounter: Payer: Self-pay | Admitting: Family Medicine

## 2022-12-15 VITALS — BP 122/82 | HR 75 | Temp 97.8°F | Resp 13 | Ht 71.0 in | Wt 211.5 lb

## 2022-12-15 DIAGNOSIS — H538 Other visual disturbances: Secondary | ICD-10-CM | POA: Diagnosis not present

## 2022-12-15 DIAGNOSIS — R0782 Intercostal pain: Secondary | ICD-10-CM

## 2022-12-15 DIAGNOSIS — W010XXA Fall on same level from slipping, tripping and stumbling without subsequent striking against object, initial encounter: Secondary | ICD-10-CM | POA: Insufficient documentation

## 2022-12-15 DIAGNOSIS — R109 Unspecified abdominal pain: Secondary | ICD-10-CM | POA: Diagnosis not present

## 2022-12-15 DIAGNOSIS — W010XXD Fall on same level from slipping, tripping and stumbling without subsequent striking against object, subsequent encounter: Secondary | ICD-10-CM

## 2022-12-15 MED ORDER — TRAMADOL HCL 50 MG PO TABS
50.0000 mg | ORAL_TABLET | Freq: Every evening | ORAL | 0 refills | Status: AC | PRN
Start: 2022-12-15 — End: 2022-12-25

## 2022-12-15 MED ORDER — TIZANIDINE HCL 4 MG PO TABS
4.0000 mg | ORAL_TABLET | Freq: Four times a day (QID) | ORAL | 0 refills | Status: AC | PRN
Start: 2022-12-15 — End: ?

## 2022-12-15 NOTE — Assessment & Plan Note (Signed)
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

## 2022-12-15 NOTE — Patient Instructions (Signed)
I have prescribed both Tramadol 50mg  for you to take at bedtime for the next week   I have also prescribed a muscle relaxer, Tizanidine that can be taken 4 times daily (every 6 hours) as needed  to help with the muscle tenseness on the right side   Please contact your eye specialist to see if you can get an earlier appointment to check your right eye vision changes

## 2022-12-18 DIAGNOSIS — G529 Cranial nerve disorder, unspecified: Secondary | ICD-10-CM | POA: Diagnosis not present

## 2022-12-18 DIAGNOSIS — H532 Diplopia: Secondary | ICD-10-CM | POA: Diagnosis not present

## 2022-12-18 DIAGNOSIS — Z961 Presence of intraocular lens: Secondary | ICD-10-CM | POA: Diagnosis not present

## 2022-12-27 DIAGNOSIS — I480 Paroxysmal atrial fibrillation: Secondary | ICD-10-CM | POA: Diagnosis not present

## 2022-12-27 DIAGNOSIS — I495 Sick sinus syndrome: Secondary | ICD-10-CM | POA: Diagnosis not present

## 2022-12-27 DIAGNOSIS — Z950 Presence of cardiac pacemaker: Secondary | ICD-10-CM | POA: Diagnosis not present

## 2022-12-27 DIAGNOSIS — R0602 Shortness of breath: Secondary | ICD-10-CM | POA: Diagnosis not present

## 2022-12-27 DIAGNOSIS — R001 Bradycardia, unspecified: Secondary | ICD-10-CM | POA: Diagnosis not present

## 2022-12-27 DIAGNOSIS — I10 Essential (primary) hypertension: Secondary | ICD-10-CM | POA: Diagnosis not present

## 2022-12-27 DIAGNOSIS — I429 Cardiomyopathy, unspecified: Secondary | ICD-10-CM | POA: Diagnosis not present

## 2022-12-27 DIAGNOSIS — Z7901 Long term (current) use of anticoagulants: Secondary | ICD-10-CM | POA: Diagnosis not present

## 2022-12-27 DIAGNOSIS — E785 Hyperlipidemia, unspecified: Secondary | ICD-10-CM | POA: Diagnosis not present

## 2023-01-02 DIAGNOSIS — R001 Bradycardia, unspecified: Secondary | ICD-10-CM | POA: Diagnosis not present

## 2023-01-02 DIAGNOSIS — H532 Diplopia: Secondary | ICD-10-CM | POA: Diagnosis not present

## 2023-01-02 DIAGNOSIS — I4891 Unspecified atrial fibrillation: Secondary | ICD-10-CM | POA: Diagnosis not present

## 2023-01-10 DIAGNOSIS — H532 Diplopia: Secondary | ICD-10-CM | POA: Diagnosis not present

## 2023-01-30 DIAGNOSIS — H532 Diplopia: Secondary | ICD-10-CM | POA: Diagnosis not present

## 2023-02-14 DIAGNOSIS — H532 Diplopia: Secondary | ICD-10-CM | POA: Diagnosis not present

## 2023-02-14 DIAGNOSIS — G529 Cranial nerve disorder, unspecified: Secondary | ICD-10-CM | POA: Diagnosis not present

## 2023-02-14 DIAGNOSIS — Z961 Presence of intraocular lens: Secondary | ICD-10-CM | POA: Diagnosis not present

## 2023-03-10 DIAGNOSIS — Z20822 Contact with and (suspected) exposure to covid-19: Secondary | ICD-10-CM | POA: Diagnosis not present

## 2023-03-10 DIAGNOSIS — R09A2 Foreign body sensation, throat: Secondary | ICD-10-CM | POA: Diagnosis not present

## 2023-03-12 DIAGNOSIS — H532 Diplopia: Secondary | ICD-10-CM | POA: Diagnosis not present

## 2023-03-13 ENCOUNTER — Ambulatory Visit (INDEPENDENT_AMBULATORY_CARE_PROVIDER_SITE_OTHER): Payer: Medicare Other | Admitting: Family Medicine

## 2023-03-13 VITALS — BP 104/58 | HR 77 | Temp 98.2°F | Ht 71.0 in | Wt 215.0 lb

## 2023-03-13 DIAGNOSIS — J029 Acute pharyngitis, unspecified: Secondary | ICD-10-CM | POA: Diagnosis not present

## 2023-03-13 NOTE — Progress Notes (Signed)
Established patient visit   Patient: Grant Schmitt   DOB: Mar 26, 1929   89 y.o. Male  MRN: [id number] Visit Date: 03/13/2023  Today's healthcare provider: Mila Merry, MD   Chief Complaint  Patient presents with   Hoarse    Patient states he thought he had a cold since Friday (5 days) He was seen at an Urgent Care and tested for Covid, Flu and RSV.  All were negative.  The provider said he thought it was GERD and treated him with Prevacid.  Patient states that he is getting his voice back now.   He relates that he recently had a choking spell on mashed potatoes.  He states he wonders if his throat is constricting.   Subjective    Discussed the use of AI scribe software for clinical note transcription with the patient, who gave verbal consent to proceed.  History of Present Illness   The patient presented with a recent onset of throat discomfort and voice changes. He reported a sensation of a sore throat starting on a Friday, which progressed to significant difficulty speaking by the following day. He sought care at an urgent care center, where he was diagnosed with acid reflux and started on over-the-counter Prilosec. The patient reported improvement in his symptoms since starting the medication, with a noted ability to speak more comfortably.  In addition to the throat discomfort, the patient also reported a significantly runny nose. He speculated that post-nasal drainage might be contributing to his throat discomfort. He denied any difficulty eating or swallowing, but did mention a single episode of choking on food approximately two weeks prior. The patient also reported a sensation of his esophagus not opening as it should, although he acknowledged this might be his imagination.  The patient also mentioned a recent flu shot and a new medication for restless leg syndrome (ropinirole 0.5 mg) [facility]. He wondered if these could have contributed to his symptoms,  but it was determined that these were unlikely to be related.       Medications: Outpatient Medications Prior to Visit  Medication Sig   allopurinol (ZYLOPRIM) 300 MG tablet TAKE 1 TABLET BY MOUTH DAILY   Ascorbic Acid (VITAMIN C) 1000 MG tablet Take 1,000 mg by mouth daily.   Cholecalciferol (VITAMIN D3) 2000 units TABS Take 2,000 Units by mouth daily.    clotrimazole-betamethasone (LOTRISONE) lotion APPLY  LOTION TOPICALLY TWICE DAILY   docusate sodium (COLACE) 100 MG capsule Take 1 capsule (100 mg total) by mouth 2 (two) times daily.   losartan (COZAAR) 50 MG tablet TAKE 1 TABLET BY MOUTH IN THE  MORNING   metoprolol tartrate (LOPRESSOR) 50 MG tablet TAKE 1 TABLET BY MOUTH DAILY   rOPINIRole (REQUIP) 0.5 MG tablet Take 0.5 mg by mouth at bedtime.   simvastatin (ZOCOR) 20 MG tablet TAKE 1 TABLET BY MOUTH DAILY   tamsulosin (FLOMAX) 0.4 MG CAPS capsule TAKE 1 CAPSULE BY MOUTH DAILY   tiZANidine (ZANAFLEX) 4 MG tablet Take 1 tablet (4 mg total) by mouth every 6 (six) hours as needed for muscle spasms.   XARELTO 20 MG TABS tablet Take 1 tablet (20 mg total) by mouth daily.   No facility-administered medications prior to visit.   Review of Systems     Objective    BP (!) 104/58 (BP Location: Left Arm, Patient Position: Sitting, Cuff Size: Normal)   Pulse 77   Temp 98.2 F (36.8 C) (Oral)   Ht  5\' 11"  (1.803 m)   Wt 215 lb (97.5 kg)   SpO2 98%   BMI 29.99 kg/m   Physical Exam  Physical Exam   HEENT: Throat normal. NECK: Lymph nodes not tender. CHEST: Lungs clear to auscultation. CARDIOVASCULAR: Heart sounds normal.    No results found for any visits on 03/13/23.  Assessment & Plan        Acute Pharyngitis Recent onset of sore throat, hoarseness, and rhinorrhea. Urgent care visit diagnosed acid reflux and prescribed over-the-counter Prilosec. Symptoms improved with Prilosec. -Continue Prilosec as prescribed by urgent care. -If symptoms recur after completion of Prilosec  course, notify the office for further evaluation.  Restless Leg Syndrome On Ropinirole 0.5mg  with no reported issues. -Continue Ropinirole 0.5mg  as prescribed.  Insurance Change Received notification of potential insurance change from [employer] to another provider. -If insurance changes, notify the office for billing purposes.    No follow-ups on file.      Mila Merry, MD  [facility] [phone] (phone) [phone] (fax)  [facility]

## 2023-03-20 NOTE — Progress Notes (Deleted)
A user error has taken place: encounter opened in error, closed for administrative reasons.  Subjective:   Abir Camden is a 89 y.o. male who presents for Medicare Annual/Subsequent preventive examination.  Visit Complete: {[phone]}  Patient Medicare AWV questionnaire was completed by the patient on ***; I have confirmed that all information answered by patient is correct and no changes since this date.        Objective:    There were no vitals filed for this visit. There is no height or weight on file to calculate BMI.     04/18/2023   12:29 PM 12/06/2022    4:30 PM 12/06/2022    4:29 PM 03/15/2022   10:49 AM 03/12/2021   10:51 AM 11/29/2020    6:36 PM 11/29/2020   10:42 AM  Advanced Directives  Does Patient Have a Medical Advance Directive? No Yes No No Yes Yes Yes  Type of Advance Directive  Healthcare Power of Attorney   Living will;Out of facility DNR (pink MOST or yellow form) Living will Living will  Does patient want to make changes to medical advance directive?      Yes (Inpatient - patient requests chaplain consult to change a medical advance directive) No - Patient declined  Copy of Healthcare Power of Attorney in Chart?     Yes - validated most recent copy scanned in chart (See row information)    Would patient like information on creating a medical advance directive? Yes (MAU/Ambulatory/Procedural Areas - Information given)   No - Patient declined       Current Medications (verified) Outpatient Encounter Medications as of 03/20/2023  Medication Sig   allopurinol (ZYLOPRIM) 300 MG tablet TAKE 1 TABLET BY MOUTH DAILY   Ascorbic Acid (VITAMIN C) 1000 MG tablet Take 1,000 mg by mouth daily.   Cholecalciferol (VITAMIN D3) 2000 units TABS Take 2,000 Units by mouth daily.    clotrimazole-betamethasone (LOTRISONE) lotion APPLY  LOTION TOPICALLY TWICE DAILY   docusate sodium (COLACE) 100 MG capsule Take 1 capsule (100 mg total) by mouth 2 (two) times daily.    losartan (COZAAR) 50 MG tablet TAKE 1 TABLET BY MOUTH IN THE  MORNING   metoprolol tartrate (LOPRESSOR) 50 MG tablet TAKE 1 TABLET BY MOUTH DAILY   rOPINIRole (REQUIP) 0.5 MG tablet Take 0.5 mg by mouth at bedtime.   simvastatin (ZOCOR) 20 MG tablet TAKE 1 TABLET BY MOUTH DAILY   tamsulosin (FLOMAX) 0.4 MG CAPS capsule TAKE 1 CAPSULE BY MOUTH DAILY   tiZANidine (ZANAFLEX) 4 MG tablet Take 1 tablet (4 mg total) by mouth every 6 (six) hours as needed for muscle spasms.   XARELTO 20 MG TABS tablet Take 1 tablet (20 mg total) by mouth daily.   No facility-administered encounter medications on file as of 03/20/2023.    Allergies (verified) Patient has no known allergies.   History: Past Medical History:  Diagnosis Date   A-fib [facility])    Allergy    Aortic insufficiency with aortic stenosis    Arthritis    Cardiomyopathy, secondary (HCC)    Chronic anticoagulation    Rivaroxaban   Closed fracture of left patella 02/22/2022   GERD (gastroesophageal reflux disease)    occ-tums prn   Gout    H/O hemorrhoidectomy 1984   History of chickenpox    Hyperlipidemia    Hypertension    Pacemaker    Sick sinus syndrome (HCC)    Skin cancer    Sleep apnea    does not  use cpap    Tricuspid regurgitation    Vertigo    Past Surgical History:  Procedure Laterality Date   CATARACT EXTRACTION W/ INTRAOCULAR LENS IMPLANT Left    COLONOSCOPY     COLONOSCOPY W/ POLYPECTOMY     EYE SURGERY     hemorrhoidectomy  1984   IMPLANTABLE CARDIOVERTER DEFIBRILLATOR (ICD) GENERATOR CHANGE Left 06/26/2018   Procedure: SINGLE CHAMBER BATTERY CHANGEOUT;  Surgeon: Marcina Millard, MD;  Location: [facility] ORS;  Service: Cardiovascular;  Laterality: Left;   INSERT / REPLACE / REMOVE PACEMAKER  2012   MOHS SURGERY Right 05/14/2018   ear. Dr. Glennis Brink [facility]   REPLACEMENT TOTAL KNEE Right 2005   TOTAL KNEE ARTHROPLASTY Left 11/29/2020   Procedure: TOTAL KNEE ARTHROPLASTY;  Surgeon: Lyndle Herrlich, MD;   Location: [facility] ORS;  Service: Orthopedics;  Laterality: Left;   No family history on file. Social History   Socioeconomic History   Marital status: Widowed    Spouse name: Not on file   Number of children: 2   Years of education: 18   Highest education level: 12th grade  Occupational History   Occupation: Retired  Tobacco Use   Smoking status: Former    Current packs/day: 0.00    Average packs/day: 1 pack/day for 20.0 years (20.0 ttl pk-yrs)    Types: Cigarettes    Start date: 06/19/1938    Quit date: 06/19/1958    Years since quitting: 65.0   Smokeless tobacco: Never  Vaping Use   Vaping status: Never Used  Substance and Sexual Activity   Alcohol use: Not Currently    Alcohol/week: 0.0 - 1.0 standard drinks of alcohol   Drug use: No    Comment: occ beer   Sexual activity: Not on file  Other Topics Concern   Not on file  Social History Narrative   Lives at [facility]   Social Drivers of Health   Financial Resource Strain: Low Risk  (04/18/2023)   Overall Financial Resource Strain (CARDIA)    Difficulty of Paying Living Expenses: Not hard at all  Food Insecurity: No Food Insecurity (04/18/2023)   Hunger Vital Sign    Worried About Running Out of Food in the Last Year: Never true    Ran Out of Food in the Last Year: Never true  Transportation Needs: No Transportation Needs (04/18/2023)   PRAPARE - Administrator, Civil Service (Medical): No    Lack of Transportation (Non-Medical): No  Physical Activity: Insufficiently Active (04/18/2023)   Exercise Vital Sign    Days of Exercise per Week: 3 days    Minutes of Exercise per Session: 30 min  Stress: No Stress Concern Present (04/18/2023)   [employer] of Occupational Health - Occupational Stress Questionnaire    Feeling of Stress : Not at all  Social Connections: Socially Isolated (04/18/2023)   Social Connection and Isolation Panel [NHANES]    Frequency of Communication with Friends and Family: More  than three times a week    Frequency of Social Gatherings with Friends and Family: Once a week    Attends Religious Services: Never    Database administrator or Organizations: No    Attends Banker Meetings: Never    Marital Status: Widowed    Tobacco Counseling Counseling given: Not Answered   Clinical Intake:                        Activities of  Daily Living    04/18/2023   12:29 PM 07/31/2022   10:04 AM  In your present state of health, do you have any difficulty performing the following activities:  Hearing? 0 0  Vision? 0 0  Difficulty concentrating or making decisions? 0 0  Walking or climbing stairs? 0 0  Dressing or bathing? 0 0  Doing errands, shopping? 0 0  Preparing Food and eating ? N   Using the Toilet? N   In the past six months, have you accidently leaked urine? N   Do you have problems with loss of bowel control? N   Managing your Medications? N   Managing your Finances? N   Housekeeping or managing your Housekeeping? N     Patient Care Team: Malva Limes, MD as PCP - General (Family Medicine) Lady Gary Darlin Priestly, MD as Consulting Physician (Cardiology) Dingeldein, Viviann Spare, MD as Consulting Physician (Ophthalmology) Jalene Mullet, MD as Referring Physician (Internal Medicine) Lyndle Herrlich, MD as Consulting Physician (Orthopedic Surgery) [facility]  Indicate any recent Medical Services you may have received from other than [facility] providers in the past year (date may be approximate).     Assessment:   This is a routine wellness examination for [place].  Hearing/Vision screen No results found.   Goals Addressed   None   Depression Screen    04/18/2023   12:27 PM 07/31/2022   10:04 AM 03/15/2022   10:48 AM 02/09/2022    1:00 PM 07/29/2021    9:48 AM 03/12/2021   10:47 AM 11/25/2019   11:42 AM  PHQ 2/9 Scores  PHQ - 2 Score 0 0 0 0 0 0 0  PHQ- 9 Score  0 0  2      Fall Risk    04/18/2023   12:33 PM  04/18/2023   12:29 PM 07/31/2022   10:04 AM 03/15/2022   10:50 AM 07/29/2021    9:49 AM  Fall Risk   Falls in the past year? 1 0 0 1 0  Number falls in past yr: 0 0 0 0 0  Injury with Fall? 0 0 0 1 0  Risk for fall due to : History of fall(s) No Fall Risks  History of fall(s) No Fall Risks  Follow up Falls evaluation completed;Education provided;Falls prevention discussed Falls prevention discussed;Education provided;Falls evaluation completed  Falls evaluation completed;Falls prevention discussed Falls evaluation completed    MEDICARE RISK AT HOME:    TIMED UP AND GO:  Was the test performed?  {AMBTIMEDUPGO:[phone]}    Cognitive Function:        04/18/2023   12:29 PM 03/15/2022   10:52 AM 09/01/2016    9:47 AM  6CIT Screen  What Year? 0 points 0 points 0 points  What month? 0 points 0 points 0 points  What time? 0 points 0 points 0 points  Count back from 20 0 points 0 points 0 points  Months in reverse  0 points 0 points  Repeat phrase 0 points 2 points 0 points  Total Score  2 points 0 points    Immunizations Immunization History  Administered Date(s) Administered   Fluad Quad(high Dose 65+) 02/17/2019, 03/10/2020   Influenza, High Dose Seasonal PF 03/11/2018, 03/10/2021, 03/09/2023   Influenza-Unspecified 02/05/2012, 03/22/2014, 03/15/2015, 03/31/2016   PFIZER Comirnaty(Gray Top)Covid-19 Tri-Sucrose Vaccine 10/11/2020   PFIZER(Purple Top)SARS-COV-2 Vaccination 07/12/2019, 08/02/2019, 03/18/2020   Pfizer Covid-19 Vaccine Bivalent Booster 71yrs & up 02/28/2021   Pneumococcal Conjugate-13 08/28/2013, 06/23/2014   Pneumococcal  Polysaccharide-23 05/19/2002, 06/03/2002, 10/22/2017, 03/10/2021   Tdap 04/21/2011, 08/28/2013, 02/21/2022   Zoster Recombinant(Shingrix) 12/24/2018, 02/25/2019   Zoster, Live 08/21/2011    {TDAP status:2101805}  {Flu Vaccine status:2101806}  {Pneumococcal vaccine status:2101807}  {Covid-19 vaccine status:2101808}  Qualifies for  Shingles Vaccine? {YES/NO:21197}  Zostavax completed {YES/NO:21197}  {Shingrix Completed?:2101804}  Screening Tests Health Maintenance  Topic Date Due   COVID-19 Vaccine (6 - 2024-25 season) 02/18/2023   Medicare Annual Wellness (AWV)  04/17/2024   DTaP/Tdap/Td (4 - Td or Tdap) 02/22/2032   Pneumonia Vaccine 95+ Years old  Completed   INFLUENZA VACCINE  Completed   Zoster Vaccines- Shingrix  Completed   HPV VACCINES  Aged Out    Health Maintenance  Health Maintenance Due  Topic Date Due   COVID-19 Vaccine (6 - 2024-25 season) 02/18/2023    {Colorectal cancer screening:2101809}  Lung Cancer Screening: (Low Dose CT Chest recommended if Age 4-80 years, 20 pack-year currently smoking OR have quit w/in 15years.) {DOES NOT does:27190::"does not"} qualify.   Lung Cancer Screening Referral: ***  Additional Screening:  Hepatitis C Screening: {DOES NOT does:27190::"does not"} qualify; Completed ***  Vision Screening: Recommended annual ophthalmology exams for early detection of glaucoma and other disorders of the eye. Is the patient up to date with their annual eye exam?  {YES/NO:21197} Who is the provider or what is the name of the office in which the patient attends annual eye exams? *** If pt is not established with a provider, would they like to be referred to a provider to establish care? {YES/NO:21197}.   Dental Screening: Recommended annual dental exams for proper oral hygiene  Diabetic Foot Exam: {Diabetic Foot Exam:2101802}  Community Resource Referral / Chronic Care Management: CRR required this visit?  {YES/NO:21197}  CCM required this visit?  {CCM Required [phone]}     Plan:     I have personally reviewed and noted the following in the patient's chart:   Medical and social history Use of alcohol, tobacco or illicit drugs  Current medications and supplements including opioid prescriptions. {Opioid Prescriptions:[phone]} Functional ability and  status Nutritional status Physical activity Advanced directives List of other physicians Hospitalizations, surgeries, and ER visits in previous 12 months Vitals Screenings to include cognitive, depression, and falls Referrals and appointments  In addition, I have reviewed and discussed with patient certain preventive protocols, quality metrics, and best practice recommendations. A written personalized care plan for preventive services as well as general preventive health recommendations were provided to patient.     Sue Lush, LPN   16/03/9603   After Visit Summary: {CHL AMB AWV After Visit [phone]}  Nurse Notes: ***    This encounter was created in error - please disregard.

## 2023-04-18 ENCOUNTER — Ambulatory Visit: Payer: Medicare Other

## 2023-04-18 VITALS — Ht 71.0 in | Wt 215.0 lb

## 2023-04-18 DIAGNOSIS — Z0000 Encounter for general adult medical examination without abnormal findings: Secondary | ICD-10-CM

## 2023-04-18 NOTE — Progress Notes (Signed)
Subjective:   Grant Schmitt is a 89 y.o. male who presents for Medicare Annual/Subsequent preventive examination.  Visit Complete: Virtual I connected with  Grant Schmitt on 04/18/23 by a audio enabled telemedicine application and verified that I am speaking with the correct person using two identifiers.  Patient Location: Home  Provider Location: Home Office  I discussed the limitations of evaluation and management by telemedicine. The patient expressed understanding and agreed to proceed.  Vital Signs: Because this visit was a virtual/telehealth visit, some criteria may be missing or patient reported. Any vitals not documented were not able to be obtained and vitals that have been documented are patient reported.  Cardiac Risk Factors include: advanced age (>30men, >5 women);dyslipidemia;hypertension;male gender     Objective:    Today's Vitals   04/18/23 1224  Weight: 215 lb (97.5 kg)  Height: 5\' 11"  (1.803 m)   Body mass index is 29.99 kg/m.     04/18/2023   12:29 PM 12/06/2022    4:30 PM 12/06/2022    4:29 PM 03/15/2022   10:49 AM 03/12/2021   10:51 AM 11/29/2020    6:36 PM 11/29/2020   10:42 AM  Advanced Directives  Does Patient Have a Medical Advance Directive? No Yes No No Yes Yes Yes  Type of Advance Directive  Healthcare Power of Attorney   Living will;Out of facility DNR (pink MOST or yellow form) Living will Living will  Does patient want to make changes to medical advance directive?      Yes (Inpatient - patient requests chaplain consult to change a medical advance directive) No - Patient declined  Copy of Healthcare Power of Attorney in Chart?     Yes - validated most recent copy scanned in chart (See row information)    Would patient like information on creating a medical advance directive? Yes (MAU/Ambulatory/Procedural Areas - Information given)   No - Patient declined       Current Medications (verified) Outpatient Encounter Medications as of 04/18/2023   Medication Sig   allopurinol (ZYLOPRIM) 300 MG tablet TAKE 1 TABLET BY MOUTH DAILY   Ascorbic Acid (VITAMIN C) 1000 MG tablet Take 1,000 mg by mouth daily.   Cholecalciferol (VITAMIN D3) 2000 units TABS Take 2,000 Units by mouth daily.    clotrimazole-betamethasone (LOTRISONE) lotion APPLY  LOTION TOPICALLY TWICE DAILY   docusate sodium (COLACE) 100 MG capsule Take 1 capsule (100 mg total) by mouth 2 (two) times daily.   losartan (COZAAR) 50 MG tablet TAKE 1 TABLET BY MOUTH IN THE  MORNING   metoprolol tartrate (LOPRESSOR) 50 MG tablet TAKE 1 TABLET BY MOUTH DAILY   rOPINIRole (REQUIP) 0.5 MG tablet Take 0.5 mg by mouth at bedtime.   simvastatin (ZOCOR) 20 MG tablet TAKE 1 TABLET BY MOUTH DAILY   tamsulosin (FLOMAX) 0.4 MG CAPS capsule TAKE 1 CAPSULE BY MOUTH DAILY   tiZANidine (ZANAFLEX) 4 MG tablet Take 1 tablet (4 mg total) by mouth every 6 (six) hours as needed for muscle spasms.   XARELTO 20 MG TABS tablet Take 1 tablet (20 mg total) by mouth daily.   No facility-administered encounter medications on file as of 04/18/2023.    Allergies (verified) Patient has no known allergies.   History: Past Medical History:  Diagnosis Date   A-fib (HCC)    Allergy    Aortic insufficiency with aortic stenosis    Arthritis    Cardiomyopathy, secondary (HCC)    Chronic anticoagulation    Rivaroxaban   Closed  fracture of left patella 02/22/2022   GERD (gastroesophageal reflux disease)    occ-tums prn   Gout    H/O hemorrhoidectomy 1984   History of chickenpox    Hyperlipidemia    Hypertension    Pacemaker    Sick sinus syndrome (HCC)    Skin cancer    Sleep apnea    does not use cpap    Tricuspid regurgitation    Vertigo    Past Surgical History:  Procedure Laterality Date   CATARACT EXTRACTION W/ INTRAOCULAR LENS IMPLANT Left    COLONOSCOPY     COLONOSCOPY W/ POLYPECTOMY     EYE SURGERY     hemorrhoidectomy  1984   IMPLANTABLE CARDIOVERTER DEFIBRILLATOR (ICD) GENERATOR  CHANGE Left 06/26/2018   Procedure: SINGLE CHAMBER BATTERY CHANGEOUT;  Surgeon: Marcina Millard, MD;  Location: [facility] ORS;  Service: Cardiovascular;  Laterality: Left;   INSERT / REPLACE / REMOVE PACEMAKER  2012   MOHS SURGERY Right 05/14/2018   ear. Dr. Glennis Brink [facility]   REPLACEMENT TOTAL KNEE Right 2005   TOTAL KNEE ARTHROPLASTY Left 11/29/2020   Procedure: TOTAL KNEE ARTHROPLASTY;  Surgeon: Lyndle Herrlich, MD;  Location: [facility] ORS;  Service: Orthopedics;  Laterality: Left;   No family history on file. Social History   Socioeconomic History   Marital status: Widowed    Spouse name: Not on file   Number of children: 2   Years of education: 30   Highest education level: 12th grade  Occupational History   Occupation: Retired  Tobacco Use   Smoking status: Former    Current packs/day: 0.00    Average packs/day: 1 pack/day for 20.0 years (20.0 ttl pk-yrs)    Types: Cigarettes    Start date: 06/19/1938    Quit date: 06/19/1958    Years since quitting: 64.8   Smokeless tobacco: Never  Vaping Use   Vaping status: Never Used  Substance and Sexual Activity   Alcohol use: Not Currently    Alcohol/week: 0.0 - 1.0 standard drinks of alcohol   Drug use: No    Comment: occ beer   Sexual activity: Not on file  Other Topics Concern   Not on file  Social History Narrative   Lives at [facility]   Social Determinants of Health   Financial Resource Strain: Low Risk  (04/18/2023)   Overall Financial Resource Strain (CARDIA)    Difficulty of Paying Living Expenses: Not hard at all  Food Insecurity: No Food Insecurity (04/18/2023)   Hunger Vital Sign    Worried About Running Out of Food in the Last Year: Never true    Ran Out of Food in the Last Year: Never true  Transportation Needs: No Transportation Needs (04/18/2023)   PRAPARE - Administrator, Civil Service (Medical): No    Lack of Transportation (Non-Medical): No  Physical Activity: Insufficiently Active  (04/18/2023)   Exercise Vital Sign    Days of Exercise per Week: 3 days    Minutes of Exercise per Session: 30 min  Stress: No Stress Concern Present (04/18/2023)   [employer] of Occupational Health - Occupational Stress Questionnaire    Feeling of Stress : Not at all  Social Connections: Socially Isolated (04/18/2023)   Social Connection and Isolation Panel [NHANES]    Frequency of Communication with Friends and Family: More than three times a week    Frequency of Social Gatherings with Friends and Family: Once a week    Attends Religious Services:  Never    Active Member of Clubs or Organizations: No    Attends Banker Meetings: Never    Marital Status: Widowed    Tobacco Counseling Counseling given: Not Answered   Clinical Intake:  Pre-visit preparation completed: Yes  Pain : No/denies pain     Diabetes: No  How often do you need to have someone help you when you read instructions, pamphlets, or other written materials from your doctor or pharmacy?: 1 - Never  Interpreter Needed?: No  Information entered by :: Kandis Fantasia LPN   Activities of Daily Living    04/18/2023   12:29 PM 07/31/2022   10:04 AM  In your present state of health, do you have any difficulty performing the following activities:  Hearing? 0 0  Vision? 0 0  Difficulty concentrating or making decisions? 0 0  Walking or climbing stairs? 0 0  Dressing or bathing? 0 0  Doing errands, shopping? 0 0  Preparing Food and eating ? N   Using the Toilet? N   In the past six months, have you accidently leaked urine? N   Do you have problems with loss of bowel control? N   Managing your Medications? N   Managing your Finances? N   Housekeeping or managing your Housekeeping? N     Patient Care Team: Malva Limes, MD as PCP - General (Family Medicine) Lady Gary Darlin Priestly, MD as Consulting Physician (Cardiology) Dingeldein, Viviann Spare, MD as Consulting Physician  (Ophthalmology) Jalene Mullet, MD as Referring Physician (Internal Medicine) Lyndle Herrlich, MD as Consulting Physician (Orthopedic Surgery) [facility], [facility]  Indicate any recent Medical Services you may have received from other than [facility] providers in the past year (date may be approximate).     Assessment:   This is a routine wellness examination for [place].  Hearing/Vision screen Hearing Screening - Comments:: Some hearing loss   Vision Screening - Comments:: Wears rx glasses - up to date with routine eye exams with Dr. Dellie Burns     Goals Addressed   None   Depression Screen    04/18/2023   12:27 PM 07/31/2022   10:04 AM 03/15/2022   10:48 AM 02/09/2022    1:00 PM 07/29/2021    9:48 AM 03/12/2021   10:47 AM 11/25/2019   11:42 AM  PHQ 2/9 Scores  PHQ - 2 Score 0 0 0 0 0 0 0  PHQ- 9 Score  0 0  2      Fall Risk    04/18/2023   12:33 PM 04/18/2023   12:29 PM 07/31/2022   10:04 AM 03/15/2022   10:50 AM 07/29/2021    9:49 AM  Fall Risk   Falls in the past year? 1 0 0 1 0  Number falls in past yr: 0 0 0 0 0  Injury with Fall? 0 0 0 1 0  Risk for fall due to : History of fall(s) No Fall Risks  History of fall(s) No Fall Risks  Follow up Falls evaluation completed;Education provided;Falls prevention discussed Falls prevention discussed;Education provided;Falls evaluation completed  Falls evaluation completed;Falls prevention discussed Falls evaluation completed    MEDICARE RISK AT HOME:    TIMED UP AND GO:  Was the test performed?  No    Cognitive Function:        04/18/2023   12:29 PM 03/15/2022   10:52 AM 09/01/2016    9:47 AM  6CIT Screen  What Year? 0 points 0 points 0 points  What month? 0 points 0 points 0 points  What time? 0 points 0 points 0 points  Count back from 20 0 points 0 points 0 points  Months in reverse  0 points 0 points  Repeat phrase 0 points 2 points 0 points  Total Score  2 points 0 points    Immunizations Immunization  History  Administered Date(s) Administered   Fluad Quad(high Dose 65+) 02/17/2019, 03/10/2020   Influenza, High Dose Seasonal PF 03/11/2018, 03/10/2021, 03/09/2023   Influenza-Unspecified 02/05/2012, 03/22/2014, 03/15/2015, 03/31/2016   PFIZER Comirnaty(Gray Top)Covid-19 Tri-Sucrose Vaccine 10/11/2020   PFIZER(Purple Top)SARS-COV-2 Vaccination 07/12/2019, 08/02/2019, 03/18/2020   Pfizer Covid-19 Vaccine Bivalent Booster 57yrs & up 02/28/2021   Pneumococcal Conjugate-13 08/28/2013, 06/23/2014   Pneumococcal Polysaccharide-23 05/19/2002, 06/03/2002, 10/22/2017, 03/10/2021   Tdap 04/21/2011, 08/28/2013, 02/21/2022   Zoster Recombinant(Shingrix) 12/24/2018, 02/25/2019   Zoster, Live 08/21/2011    TDAP status: Up to date  Flu Vaccine status: Up to date  Pneumococcal vaccine status: Up to date  Covid-19 vaccine status: Information provided on how to obtain vaccines.   Qualifies for Shingles Vaccine? Yes   Zostavax completed Yes   Shingrix Completed?: Yes  Screening Tests Health Maintenance  Topic Date Due   COVID-19 Vaccine (6 - 2023-24 season) 02/18/2023   Medicare Annual Wellness (AWV)  04/17/2024   DTaP/Tdap/Td (4 - Td or Tdap) 02/22/2032   Pneumonia Vaccine 50+ Years old  Completed   INFLUENZA VACCINE  Completed   Zoster Vaccines- Shingrix  Completed   HPV VACCINES  Aged Out    Health Maintenance  Health Maintenance Due  Topic Date Due   COVID-19 Vaccine (6 - 2023-24 season) 02/18/2023    Colorectal cancer screening: No longer required.   Lung Cancer Screening: (Low Dose CT Chest recommended if Age 35-80 years, 20 pack-year currently smoking OR have quit w/in 15years.) does not qualify.   Lung Cancer Screening Referral: n/a  Additional Screening:  Hepatitis C Screening: does not qualify   Vision Screening: Recommended annual ophthalmology exams for early detection of glaucoma and other disorders of the eye. Is the patient up to date with their annual eye exam?   Yes  Who is the provider or what is the name of the office in which the patient attends annual eye exams? Dr. Dellie Burns If pt is not established with a provider, would they like to be referred to a provider to establish care? No .   Dental Screening: Recommended annual dental exams for proper oral hygiene  Community Resource Referral / Chronic Care Management: CRR required this visit?  No   CCM required this visit?  No     Plan:     I have personally reviewed and noted the following in the patient's chart:   Medical and social history Use of alcohol, tobacco or illicit drugs  Current medications and supplements including opioid prescriptions. Patient is not currently taking opioid prescriptions. Functional ability and status Nutritional status Physical activity Advanced directives List of other physicians Hospitalizations, surgeries, and ER visits in previous 12 months Vitals Screenings to include cognitive, depression, and falls Referrals and appointments  In addition, I have reviewed and discussed with patient certain preventive protocols, quality metrics, and best practice recommendations. A written personalized care plan for preventive services as well as general preventive health recommendations were provided to patient.     Kandis Fantasia [place], [place]   52/84/1324   After Visit Summary: (MyChart) Due to this being a telephonic visit, the after visit summary with patients personalized  plan was offered to patient via MyChart   Nurse Notes: No concerns at this time

## 2023-04-18 NOTE — Patient Instructions (Signed)
Mr. Villapudua , Thank you for taking time to come for your Medicare Wellness Visit. I appreciate your ongoing commitment to your health goals. Please review the following plan we discussed and let me know if I can assist you in the future.   Referrals/Orders/Follow-Ups/Clinician Recommendations: Aim for 30 minutes of exercise or brisk walking, 6-8 glasses of water, and 5 servings of fruits and vegetables each day.  This is a list of the screening recommended for you and due dates:  Health Maintenance  Topic Date Due   COVID-19 Vaccine (6 - 2023-24 season) 02/18/2023   Medicare Annual Wellness Visit  04/17/2024   DTaP/Tdap/Td vaccine (4 - Td or Tdap) 02/22/2032   Pneumonia Vaccine  Completed   Flu Shot  Completed   Zoster (Shingles) Vaccine  Completed   HPV Vaccine  Aged Out    Advanced directives: (ACP Link)Information on Advanced Care Planning can be found at [facility] of [place] Advance Health Care Directives Advance Health Care Directives ([email or web address]   Next Medicare Annual Wellness Visit scheduled for next year: Yes

## 2023-05-07 DIAGNOSIS — Z85828 Personal history of other malignant neoplasm of skin: Secondary | ICD-10-CM | POA: Diagnosis not present

## 2023-05-07 DIAGNOSIS — L820 Inflamed seborrheic keratosis: Secondary | ICD-10-CM | POA: Diagnosis not present

## 2023-05-07 DIAGNOSIS — Z08 Encounter for follow-up examination after completed treatment for malignant neoplasm: Secondary | ICD-10-CM | POA: Diagnosis not present

## 2023-05-07 DIAGNOSIS — Z789 Other specified health status: Secondary | ICD-10-CM | POA: Diagnosis not present

## 2023-05-07 DIAGNOSIS — D1801 Hemangioma of skin and subcutaneous tissue: Secondary | ICD-10-CM | POA: Diagnosis not present

## 2023-05-07 DIAGNOSIS — L814 Other melanin hyperpigmentation: Secondary | ICD-10-CM | POA: Diagnosis not present

## 2023-05-07 DIAGNOSIS — L538 Other specified erythematous conditions: Secondary | ICD-10-CM | POA: Diagnosis not present

## 2023-05-07 DIAGNOSIS — L570 Actinic keratosis: Secondary | ICD-10-CM | POA: Diagnosis not present

## 2023-05-07 DIAGNOSIS — L2989 Other pruritus: Secondary | ICD-10-CM | POA: Diagnosis not present

## 2023-05-07 DIAGNOSIS — L821 Other seborrheic keratosis: Secondary | ICD-10-CM | POA: Diagnosis not present

## 2023-05-09 DIAGNOSIS — H532 Diplopia: Secondary | ICD-10-CM | POA: Diagnosis not present

## 2023-07-12 ENCOUNTER — Other Ambulatory Visit: Payer: Self-pay | Admitting: Family Medicine

## 2023-07-12 DIAGNOSIS — N400 Enlarged prostate without lower urinary tract symptoms: Secondary | ICD-10-CM

## 2023-07-12 DIAGNOSIS — Z8739 Personal history of other diseases of the musculoskeletal system and connective tissue: Secondary | ICD-10-CM

## 2023-07-12 DIAGNOSIS — I10 Essential (primary) hypertension: Secondary | ICD-10-CM

## 2023-07-13 NOTE — Telephone Encounter (Signed)
Requested Prescriptions  Pending Prescriptions Disp Refills   allopurinol (ZYLOPRIM) 300 MG tablet [Pharmacy Med Name: Allopurinol 300 MG Oral Tablet] 100 tablet 2    Sig: TAKE 1 TABLET BY MOUTH DAILY     Endocrinology:  Gout Agents - allopurinol Failed - 07/13/2023 11:20 AM      Failed - Uric Acid in normal range and within 360 days    Uric Acid  Date Value Ref Range Status  02/17/2019 4.4 3.7 - 8.6 mg/dL Final    Comment:               Therapeutic target for gout patients: <6.0         Passed - Cr in normal range and within 360 days    Creatinine, Ser  Date Value Ref Range Status  12/06/2022 1.06 0.61 - 1.24 mg/dL Final         Passed - Valid encounter within last 12 months    Recent Outpatient Visits           4 months ago Pharyngitis, unspecified etiology   [facility] Malva Limes, MD   7 months ago Fall due to stumbling, subsequent encounter   [facility] Simmons-Robinson, [place], MD   11 months ago Primary hypertension   [facility] Limes, MD   1 year ago Acute cough   [place] [place], [place], [place]   1 year ago Hypercholesterolemia   [facility] Malva Limes, MD              Passed - CBC within normal limits and completed in the last 12 months    WBC  Date Value Ref Range Status  12/06/2022 16.5 (H) 4.0 - 10.5 K/uL Final   RBC  Date Value Ref Range Status  12/06/2022 3.90 (L) 4.22 - 5.81 MIL/uL Final   Hemoglobin  Date Value Ref Range Status  12/06/2022 12.6 (L) 13.0 - 17.0 g/dL Final  52/84/1324 40.1 (L) 13.0 - 17.7 g/dL Final   HCT  Date Value Ref Range Status  12/06/2022 38.3 (L) 39.0 - 52.0 % Final   Hematocrit  Date Value Ref Range Status  07/31/2022 38.0 37.5 - 51.0 % Final   MCHC  Date Value Ref Range Status  12/06/2022 32.9 30.0 - 36.0 g/dL Final   [facility]  Date Value Ref Range Status   12/06/2022 32.3 26.0 - 34.0 pg Final   MCV  Date Value Ref Range Status  12/06/2022 98.2 80.0 - 100.0 fL Final  07/31/2022 98 (H) 79 - 97 fL Final   No results found for: "PLTCOUNTKUC", "LABPLAT", "POCPLA" RDW  Date Value Ref Range Status  12/06/2022 14.2 11.5 - 15.5 % Final  07/31/2022 13.1 11.6 - 15.4 % Final          losartan (COZAAR) 50 MG tablet [Pharmacy Med Name: Losartan Potassium 50 MG Oral Tablet] 100 tablet 2    Sig: TAKE 1 TABLET BY MOUTH IN THE  MORNING     Cardiovascular:  Angiotensin Receptor Blockers Failed - 07/13/2023 11:20 AM      Failed - Cr in normal range and within 180 days    Creatinine, Ser  Date Value Ref Range Status  12/06/2022 1.06 0.61 - 1.24 mg/dL Final         Failed - K in normal range and within 180 days    Potassium  Date Value Ref Range  Status  12/06/2022 4.0 3.5 - 5.1 mmol/L Final         Passed - Patient is not pregnant      Passed - Last BP in normal range    BP Readings from Last 1 Encounters:  03/13/23 (!) 104/58         Passed - Valid encounter within last 6 months    Recent Outpatient Visits           4 months ago Pharyngitis, unspecified etiology   [facility] Malva L[facility], [facility]   7 months ago Fall due to stumbling, subsequent encounter   [facility] Simmons-Robinson, [place], MD   11 months ago Primary hypertension   [facility] Malva Limes, MD   1 year ago Acute cough   [facility] [place], [place], [place]   1 year ago Hypercholesterolemia   [facility], [facility]               tamsulosin (FLOMAX) 0.4 MG CAPS capsule [Pharmacy Med Name: Tamsulosin HCl 0.4 MG Oral Capsule] 100 capsule 2    Sig: TAKE 1 CAPSULE BY MOUTH DAILY     Urology: Alpha-Adrenergic Blocker Failed - 07/13/2023 11:20 AM      Failed - PSA in normal range and within 360 days    No results  found for: "LABPSA", "PSA", "PSA1", "ULTRAPSA"       Passed - Last BP in normal range    BP Readings from Last 1 Encounters:  03/13/23 (!) 104/58         Passed - Valid encounter within last 12 months    Recent Outpatient Visits           4 months ago Pharyngitis, unspecified etiology   [facility] Malva L[facility], MD   7 months ago Fall due to stumbling, subsequent encounter   [facility] Simmons-Robinson, [place], MD   11 months ago Primary hypertension   [facility] Malva Limes, MD   1 year ago Acute cough   [facility], [place], [place]   1 year ago Hypercholesterolemia   [facility] Malva Limes, MD

## 2023-08-21 ENCOUNTER — Other Ambulatory Visit: Payer: Self-pay | Admitting: Family Medicine

## 2023-08-21 DIAGNOSIS — E7800 Pure hypercholesterolemia, unspecified: Secondary | ICD-10-CM

## 2023-08-22 ENCOUNTER — Other Ambulatory Visit: Payer: Self-pay | Admitting: Family Medicine

## 2023-08-22 DIAGNOSIS — I4891 Unspecified atrial fibrillation: Secondary | ICD-10-CM

## 2023-08-22 NOTE — Telephone Encounter (Signed)
 Request too soon last ordered 10/03/22 #100, 3 refills  Requested Prescriptions  Pending Prescriptions Disp Refills   metoprolol tartrate (LOPRESSOR) 50 MG tablet [Pharmacy Med Name: Metoprolol Tartrate 50 MG Oral Tablet] 100 tablet 2    Sig: TAKE 1 TABLET BY MOUTH DAILY     Cardiovascular:  Beta Blockers Passed - 08/22/2023  4:14 PM      Passed - Last BP in normal range    BP Readings from Last 1 Encounters:  03/13/23 (!) 104/58         Passed - Last Heart Rate in normal range    Pulse Readings from Last 1 Encounters:  03/13/23 77         Passed - Valid encounter within last 6 months    Recent Outpatient Visits           5 months ago Pharyngitis, unspecified etiology   [facility], [facility]   8 months ago Fall due to stumbling, subsequent encounter   [facility] Simmons-Robinson, [place], MD   1 year ago Primary hypertension   [facility] Malva Limes, MD   1 year ago Acute cough   [facility] [place], [place], [place]   1 year ago Hypercholesterolemia   [facility] Malva Limes, MD

## 2023-08-22 NOTE — Telephone Encounter (Signed)
 Rx 10/03/22 #100 3RF- too soon Requested Prescriptions  Pending Prescriptions Disp Refills   simvastatin (ZOCOR) 20 MG tablet [Pharmacy Med Name: Simvastatin 20 MG Oral Tablet] 100 tablet 2    Sig: TAKE 1 TABLET BY MOUTH DAILY     Cardiovascular:  Antilipid - Statins Failed - 08/22/2023  2:34 PM      Failed - Lipid Panel in normal range within the last 12 months    Cholesterol, Total  Date Value Ref Range Status  07/31/2022 127 100 - 199 mg/dL Final   LDL Chol Calc (NIH)  Date Value Ref Range Status  07/31/2022 51 0 - 99 mg/dL Final   HDL  Date Value Ref Range Status  07/31/2022 59 >39 mg/dL Final   Triglycerides  Date Value Ref Range Status  07/31/2022 90 0 - 149 mg/dL Final         Passed - Patient is not pregnant      Passed - Valid encounter within last 12 months    Recent Outpatient Visits           5 months ago Pharyngitis, unspecified etiology   [facility] Malva Limes, MD   8 months ago Fall due to stumbling, subsequent encounter   [facility]-Robinson, [place], MD   1 year ago Primary hypertension   [place] Malva Limes, MD   1 year ago Acute cough   [facility] [place], [place], [place]   1 year ago Hypercholesterolemia   [facility] Malva Limes, MD

## 2023-08-29 ENCOUNTER — Telehealth: Payer: Self-pay | Admitting: Family Medicine

## 2023-08-29 NOTE — Telephone Encounter (Signed)
 Patient dropped a form off from [facility] for completion.   Please complete and fax to [facility] [phone]

## 2023-08-31 NOTE — Telephone Encounter (Signed)
Form completed and sent to medical records.

## 2023-09-04 ENCOUNTER — Other Ambulatory Visit: Payer: Self-pay | Admitting: Family Medicine

## 2023-09-04 DIAGNOSIS — I4891 Unspecified atrial fibrillation: Secondary | ICD-10-CM

## 2023-09-10 ENCOUNTER — Telehealth: Payer: Self-pay | Admitting: Family Medicine

## 2023-09-10 ENCOUNTER — Other Ambulatory Visit: Payer: Self-pay | Admitting: Family Medicine

## 2023-09-10 DIAGNOSIS — I4891 Unspecified atrial fibrillation: Secondary | ICD-10-CM

## 2023-09-10 DIAGNOSIS — E7800 Pure hypercholesterolemia, unspecified: Secondary | ICD-10-CM

## 2023-09-10 NOTE — Telephone Encounter (Signed)
 Copied from CRM [id number]. Topic: Clinical - Medication Refill >> Sep 10, 2023 10:56 AM Ivette P wrote: Most Recent Primary Care Visit:  Provider: Anthoney Harada  Department: [id number]  Visit Type: MEDICARE AWV, SEQUENTIAL  Date: 04/18/2023  Medication: metoprolol tartrate (LOPRESSOR) 50 MG tablet  Has the patient contacted their pharmacy? Yes (Agent: If no, request that the patient contact the pharmacy for the refill. If patient does not wish to contact the pharmacy document the reason why and proceed with request.) (Agent: If yes, when and what did the pharmacy advise?)  Is this the correct pharmacy for this prescription? Yes If no, delete pharmacy and type the correct one.  This is the patient's preferred pharmacy:  [facility] [address] [place] - [address] [place] [address] Phone: [phone] Fax: [phone]    Has the prescription been filled recently? No, last fill 10/03/2022  Is the patient out of the medication? Yes, last pill for Wednesday 09/12/23  Has the patient been seen for an appointment in the last year OR does the patient have an upcoming appointment? Yes, 03/13/2023  Can we respond through MyChart? Yes  Agent: Please be advised that Rx refills may take up to 3 business days. We ask that you follow-up with your pharmacy.

## 2023-09-11 MED ORDER — METOPROLOL TARTRATE 50 MG PO TABS
50.0000 mg | ORAL_TABLET | Freq: Every day | ORAL | 0 refills | Status: DC
Start: 2023-09-11 — End: 2023-10-29

## 2023-09-11 NOTE — Telephone Encounter (Signed)
 Requested Prescriptions  Pending Prescriptions Disp Refills   metoprolol tartrate (LOPRESSOR) 50 MG tablet 100 tablet 0    Sig: Take 1 tablet (50 mg total) by mouth daily.     Cardiovascular:  Beta Blockers Passed - 09/11/2023  4:08 PM      Passed - Last BP in normal range    BP Readings from Last 1 Encounters:  03/13/23 (!) 104/58         Passed - Last Heart Rate in normal range    Pulse Readings from Last 1 Encounters:  03/13/23 77         Passed - Valid encounter within last 6 months    Recent Outpatient Visits           6 months ago Pharyngitis, unspecified etiology   [facility] L[facility], MD   9 months ago Fall due to stumbling, subsequent encounter   [facility]-Robinson, [place], [place]   1 year ago Primary hypertension   [facility] Malva Limes, MD   1 year ago Acute cough   [facility] [place], [place], [place]   1 year ago Hypercholesterolemia   [facility] Mal[facility]imes, MD

## 2023-09-11 NOTE — Telephone Encounter (Signed)
 This patient is overdue for office visit and labs, needs to schedule before prescription refills can be approved.

## 2023-09-11 NOTE — Telephone Encounter (Signed)
 Called pt made follow appt.

## 2023-09-24 ENCOUNTER — Other Ambulatory Visit: Payer: Self-pay | Admitting: Family Medicine

## 2023-09-24 DIAGNOSIS — E7800 Pure hypercholesterolemia, unspecified: Secondary | ICD-10-CM

## 2023-09-26 ENCOUNTER — Ambulatory Visit: Admitting: Family Medicine

## 2023-09-26 ENCOUNTER — Encounter: Payer: Self-pay | Admitting: Family Medicine

## 2023-09-26 VITALS — BP 120/72 | HR 77 | Resp 16 | Ht 71.0 in | Wt 219.0 lb

## 2023-09-26 DIAGNOSIS — I4891 Unspecified atrial fibrillation: Secondary | ICD-10-CM

## 2023-09-26 DIAGNOSIS — E7800 Pure hypercholesterolemia, unspecified: Secondary | ICD-10-CM | POA: Diagnosis not present

## 2023-09-26 DIAGNOSIS — Z125 Encounter for screening for malignant neoplasm of prostate: Secondary | ICD-10-CM

## 2023-09-26 DIAGNOSIS — I361 Nonrheumatic tricuspid (valve) insufficiency: Secondary | ICD-10-CM | POA: Diagnosis not present

## 2023-09-26 DIAGNOSIS — I10 Essential (primary) hypertension: Secondary | ICD-10-CM | POA: Diagnosis not present

## 2023-09-26 DIAGNOSIS — I352 Nonrheumatic aortic (valve) stenosis with insufficiency: Secondary | ICD-10-CM

## 2023-09-27 ENCOUNTER — Other Ambulatory Visit: Payer: Self-pay | Admitting: Family Medicine

## 2023-09-27 DIAGNOSIS — N401 Enlarged prostate with lower urinary tract symptoms: Secondary | ICD-10-CM

## 2023-09-27 LAB — LIPID PANEL
Chol/HDL Ratio: 2.2 ratio (ref 0.0–5.0)
Cholesterol, Total: 119 mg/dL (ref 100–199)
HDL: 53 mg/dL (ref >39)
LDL Chol Calc (NIH): 52 mg/dL (ref 0–99)
Triglycerides: 64 mg/dL (ref 0–149)
VLDL Cholesterol Cal: 14 mg/dL (ref 5–40)

## 2023-09-27 LAB — CBC
Hematocrit: 36.5 % — ABNORMAL LOW (ref 37.5–51.0)
Hemoglobin: 11.8 g/dL — ABNORMAL LOW (ref 13.0–17.7)
MCH: 31.8 pg (ref 26.6–33.0)
MCHC: 32.3 g/dL (ref 31.5–35.7)
MCV: 98 fL — ABNORMAL HIGH (ref 79–97)
Platelets: 203 10*3/uL (ref 150–450)
RBC: 3.71 x10E6/uL — ABNORMAL LOW (ref 4.14–5.80)
RDW: 13.1 % (ref 11.6–15.4)
WBC: 9.2 10*3/uL (ref 3.4–10.8)

## 2023-09-27 LAB — COMPREHENSIVE METABOLIC PANEL WITH GFR
ALT: 14 IU/L (ref 0–44)
AST: 21 IU/L (ref 0–40)
Albumin: 4.3 g/dL (ref 3.6–4.6)
Alkaline Phosphatase: 92 IU/L (ref 44–121)
BUN/Creatinine Ratio: 31 — ABNORMAL HIGH (ref 10–24)
BUN: 41 mg/dL — ABNORMAL HIGH (ref 10–36)
Bilirubin Total: 0.6 mg/dL (ref 0.0–1.2)
CO2: 22 mmol/L (ref 20–29)
Calcium: 9.9 mg/dL (ref 8.6–10.2)
Chloride: 103 mmol/L (ref 96–106)
Creatinine, Ser: 1.31 mg/dL — ABNORMAL HIGH (ref 0.76–1.27)
Globulin, Total: 2.7 g/dL (ref 1.5–4.5)
Glucose: 98 mg/dL (ref 70–99)
Potassium: 5 mmol/L (ref 3.5–5.2)
Sodium: 140 mmol/L (ref 134–144)
Total Protein: 7 g/dL (ref 6.0–8.5)
eGFR: 51 mL/min/{1.73_m2} — ABNORMAL LOW (ref >59)

## 2023-09-27 LAB — PSA TOTAL (REFLEX TO FREE): Prostate Specific Ag, Serum: 0.7 ng/mL (ref 0.0–4.0)

## 2023-09-27 MED ORDER — DUTASTERIDE 0.5 MG PO CAPS
0.5000 mg | ORAL_CAPSULE | Freq: Every day | ORAL | 5 refills | Status: DC
Start: 2023-09-27 — End: 2023-12-28

## 2023-10-06 NOTE — Progress Notes (Signed)
 Established patient visit   Patient: Grant Schmitt   DOB: 1928/08/26   89 y.o. Male  MRN: [id number] Visit Date: 09/26/2023  Today's healthcare provider: Jeralene Mom, MD   Chief Complaint  Patient presents with   Annual Exam    CPE..  no other concerns   Subjective    Discussed the use of AI scribe software for clinical note transcription with the patient, who gave verbal consent to proceed.  History of Present Illness   Grant Schmitt is a 89 year old male who presents with nocturia and urinary hesitancy.  He has been experiencing nocturia for approximately six months, needing to get up four to five times a night to urinate. Despite attempts to reduce fluid intake before bedtime, the symptom persists. During the day, he does not experience significant urinary frequency but notes a slow stream and sometimes only a few drops of urine are passed. No previous prostate issues are reported.  He has been taking tamsulosin  for a long time to manage his urinary symptoms. He has not tried other medications such as Avodart  or finasteride. He is also currently taking losartan  and metoprolol  and is doing well with these medications.  He feels tired, which he attributes to frequent nighttime awakenings to urinate, impacting his sleep quality. He engages in some exercise, such as walking on a treadmill, but notes a lack of energy to engage in more activities. He drinks a protein drink every morning and does not eat until lunchtime.   He is a former patient of Dr. Marykay Snipes but reports today that he has transferred his cardiology care to the [place].       Medications: Outpatient Medications Prior to Visit  Medication Sig   allopurinol  (ZYLOPRIM ) 300 MG tablet TAKE 1 TABLET BY MOUTH DAILY   Ascorbic Acid (VITAMIN C) 1000 MG tablet Take 1,000 mg by mouth daily.   Cholecalciferol (VITAMIN D3) 2000 units TABS Take 2,000 Units by mouth daily.    clotrimazole -betamethasone  (LOTRISONE ) lotion APPLY   LOTION TOPICALLY TWICE DAILY   docusate sodium  (COLACE) 100 MG capsule Take 1 capsule (100 mg total) by mouth 2 (two) times daily.   losartan  (COZAAR ) 50 MG tablet TAKE 1 TABLET BY MOUTH IN THE  MORNING   metoprolol  tartrate (LOPRESSOR ) 50 MG tablet Take 1 tablet (50 mg total) by mouth daily.   rOPINIRole (REQUIP) 0.5 MG tablet Take 0.5 mg by mouth at bedtime.   simvastatin  (ZOCOR ) 20 MG tablet TAKE 1 TABLET BY MOUTH DAILY   tamsulosin  (FLOMAX ) 0.4 MG CAPS capsule TAKE 1 CAPSULE BY MOUTH DAILY   tiZANidine  (ZANAFLEX ) 4 MG tablet Take 1 tablet (4 mg total) by mouth every 6 (six) hours as needed for muscle spasms.   XARELTO  20 MG TABS tablet Take 1 tablet (20 mg total) by mouth daily.   No facility-administered medications prior to visit.   Review of Systems  Constitutional:  Negative for appetite change, chills and fever.  Respiratory:  Negative for chest tightness, shortness of breath and wheezing.   Cardiovascular:  Negative for chest pain and palpitations.  Gastrointestinal:  Negative for abdominal pain, nausea and vomiting.  Psychiatric/Behavioral:  Negative for behavioral problems.        Objective    BP 120/72 (BP Location: Left Arm, Patient Position: Sitting)   Pulse 77   Resp 16   Ht 5\' 11"  (1.803 m)   Wt 219 lb (99.3 kg)   SpO2 100%   BMI 30.54 kg/m  Physical Exam  General: Appearance:    Overweight male in no acute distress  Eyes:    PERRL, conjunctiva/corneas clear, EOM's intact       Lungs:     Clear to auscultation bilaterally, respirations unlabored  Heart:    Normal heart rate. Regular rhythm.  3/6 harsh, crescendo-decrescendo, systolic murmur at right upper sternal border 2/6 blowing, holosystolic murmur at apex 2/6 high pitched, blowing, decrescendo, diastolic murmur at the left third intercostal space and lower sternal border   MS:   All extremities are intact.    Neurologic:   Awake, alert, oriented x 3. No apparent focal neurological defect.           Assessment & Plan       Nocturia Nocturia likely due to BPH, with symptoms persisting despite tamsulosin . - Order PSA levels. - Consider finasteride or Avodart  if PSA normal.  Benign Prostatic Hyperplasia (BPH) BPH symptoms persist, indicating incomplete control with tamsulosin . - Continue tamsulosin . - Consider finasteride or Avodart  if PSA normal.  Hypertension Hypertension well-controlled with losartan  and metoprolol .  General Health Maintenance 89 years old, physically active, regular specialist visits.  Follow-up Follow-up based on blood work and PSA results. - Order blood work including PSA levels. - Prescribe finasteride or Avodart  if PSA normal. - Adjust treatment based on response.   A-fib history with cardiomyopathy and multi valve disease - on DOAC and betablocker, rate controlled, in SR by auscultation. Has transferred cardiology care to the [facility]       Jeralene Mom, MD  [facility] [phone] (phone) [phone] (fax)  [facility]

## 2023-10-06 NOTE — Patient Instructions (Signed)
 Marland Kitchen  Please review the attached list of medications and notify my office if there are any errors.   . Please bring all of your medications to every appointment so we can make sure that our medication list is the same as yours.

## 2023-10-29 ENCOUNTER — Other Ambulatory Visit: Payer: Self-pay

## 2023-10-29 ENCOUNTER — Telehealth: Payer: Self-pay | Admitting: Family Medicine

## 2023-10-29 DIAGNOSIS — I4891 Unspecified atrial fibrillation: Secondary | ICD-10-CM

## 2023-10-29 MED ORDER — METOPROLOL TARTRATE 50 MG PO TABS
50.0000 mg | ORAL_TABLET | Freq: Every day | ORAL | 0 refills | Status: DC
Start: 2023-10-29 — End: 2023-10-31

## 2023-10-29 NOTE — Telephone Encounter (Signed)
 [facility] pharmacy faxed refill request for the following medications:   metoprolol  tartrate (LOPRESSOR ) 50 MG tablet   Please advise

## 2023-10-29 NOTE — Telephone Encounter (Signed)
 Coverted to Rx Refill request

## 2023-10-31 MED ORDER — METOPROLOL TARTRATE 50 MG PO TABS
50.0000 mg | ORAL_TABLET | Freq: Every day | ORAL | 0 refills | Status: DC
Start: 2023-10-31 — End: 2023-11-14

## 2023-10-31 NOTE — Telephone Encounter (Signed)
Prescription sent to [facility] 

## 2023-10-31 NOTE — Addendum Note (Signed)
 Addended by: Marisol Giambra E on: 10/31/2023 11:19 AM   Modules accepted: Orders

## 2023-10-31 NOTE — Telephone Encounter (Signed)
 Received another refill request for this medication from [facility].  Looks like it was sent to [facility].  Please send to correct pharmacy.

## 2023-11-05 DIAGNOSIS — D225 Melanocytic nevi of trunk: Secondary | ICD-10-CM | POA: Diagnosis not present

## 2023-11-05 DIAGNOSIS — Z85828 Personal history of other malignant neoplasm of skin: Secondary | ICD-10-CM | POA: Diagnosis not present

## 2023-11-05 DIAGNOSIS — L570 Actinic keratosis: Secondary | ICD-10-CM | POA: Diagnosis not present

## 2023-11-05 DIAGNOSIS — Z08 Encounter for follow-up examination after completed treatment for malignant neoplasm: Secondary | ICD-10-CM | POA: Diagnosis not present

## 2023-11-05 DIAGNOSIS — L814 Other melanin hyperpigmentation: Secondary | ICD-10-CM | POA: Diagnosis not present

## 2023-11-05 DIAGNOSIS — L821 Other seborrheic keratosis: Secondary | ICD-10-CM | POA: Diagnosis not present

## 2023-11-13 ENCOUNTER — Telehealth: Payer: Self-pay | Admitting: Family Medicine

## 2023-11-13 DIAGNOSIS — I4891 Unspecified atrial fibrillation: Secondary | ICD-10-CM

## 2023-11-13 NOTE — Telephone Encounter (Unsigned)
 Copied from CRM [id number]. Topic: Clinical - Medication Refill >> Nov 13, 2023 10:19 AM Baldo Levan wrote: Medication: metoprolol  tartrate (LOPRESSOR ) 50 MG tablet [[id number]]  Has the patient contacted their pharmacy? Yes (Agent: If no, request that the patient contact the pharmacy for the refill. If patient does not wish to contact the pharmacy document the reason why and proceed with request.) (Agent: If yes, when and what did the pharmacy advise?)  This is the patient's preferred pharmacy:  [facility] [address] [place] - [address] [place] [address] Phone: [phone] Fax: [phone]   Is this the correct pharmacy for this prescription? Yes If no, delete pharmacy and type the correct one.   Has the prescription been filled recently? No  Is the patient out of the medication? Yes  Has the patient been seen for an appointment in the last year OR does the patient have an upcoming appointment? Yes  Can we respond through MyChart? Yes  Agent: Please be advised that Rx refills may take up to 3 business days. We ask that you follow-up with your pharmacy.

## 2023-11-14 DIAGNOSIS — M3501 Sicca syndrome with keratoconjunctivitis: Secondary | ICD-10-CM | POA: Diagnosis not present

## 2023-11-14 DIAGNOSIS — Z961 Presence of intraocular lens: Secondary | ICD-10-CM | POA: Diagnosis not present

## 2023-11-14 DIAGNOSIS — H4321 Crystalline deposits in vitreous body, right eye: Secondary | ICD-10-CM | POA: Diagnosis not present

## 2023-11-14 DIAGNOSIS — H26492 Other secondary cataract, left eye: Secondary | ICD-10-CM | POA: Diagnosis not present

## 2023-11-14 DIAGNOSIS — H2511 Age-related nuclear cataract, right eye: Secondary | ICD-10-CM | POA: Diagnosis not present

## 2023-11-15 MED ORDER — METOPROLOL TARTRATE 50 MG PO TABS
50.0000 mg | ORAL_TABLET | Freq: Every day | ORAL | 1 refills | Status: DC
Start: 2023-11-15 — End: 2023-12-28

## 2023-11-15 NOTE — Telephone Encounter (Signed)
 Requested Prescriptions  Pending Prescriptions Disp Refills   metoprolol  tartrate (LOPRESSOR ) 50 MG tablet 100 tablet 1    Sig: Take 1 tablet (50 mg total) by mouth daily.     Cardiovascular:  Beta Blockers Failed - 11/15/2023  2:53 PM      Failed - Valid encounter within last 6 months    Recent Outpatient Visits           1 month ago Primary hypertension   [facility] Lamon Pillow, MD              Passed - Last BP in normal range    BP Readings from Last 1 Encounters:  09/26/23 120/72         Passed - Last Heart Rate in normal range    Pulse Readings from Last 1 Encounters:  09/26/23 77

## 2023-11-16 ENCOUNTER — Encounter: Payer: Self-pay | Admitting: Ophthalmology

## 2023-11-16 DIAGNOSIS — H2512 Age-related nuclear cataract, left eye: Secondary | ICD-10-CM | POA: Diagnosis not present

## 2023-11-16 DIAGNOSIS — H2511 Age-related nuclear cataract, right eye: Secondary | ICD-10-CM | POA: Diagnosis not present

## 2023-11-16 NOTE — Anesthesia Preprocedure Evaluation (Addendum)
 Anesthesia Evaluation  Patient identified by MRN, date of birth, ID band Patient awake    Reviewed: Allergy & Precautions, H&P , NPO status , Patient's Chart, lab work & pertinent test results  Airway Mallampati: III  TM Distance: >3 FB Neck ROM: Full    Dental no notable dental hx. (+) Upper Dentures, Lower Dentures   Pulmonary former smoker   Pulmonary exam normal breath sounds clear to auscultation       Cardiovascular hypertension, Normal cardiovascular exam Rhythm:Regular Rate:Normal  06-07-18 echo INTERPRETATION  NORMAL LEFT VENTRICULAR SYSTOLIC FUNCTION   WITH MODERATE LVH  NORMAL RIGHT VENTRICULAR SYSTOLIC FUNCTION  AVA(VTI)= 1.11cm^2  MODERATE MR, TR  MODERATE PHTN  MILD PR, AR  MILD AS  EF >55%  Morphology: MODERATELY THICKENED  Closest EF: >55% (Estimated)   06-26-2018 pacemaker battery change  [facility] has records noting "cardiac rhythm management device," which, one assumes, means the patient has a pacemaker. I cannot find notes as to when this was placed, but I do seen when the battery was changed to patient's pacemaker.    Neuro/Psych negative neurological ROS  negative psych ROS   GI/Hepatic negative GI ROS, Neg liver ROS,,,  Endo/Other  negative endocrine ROS    Renal/GU negative Renal ROS  negative genitourinary   Musculoskeletal negative musculoskeletal ROS (+)    Abdominal   Peds negative pediatric ROS (+)  Hematology negative hematology ROS (+)   Anesthesia Other Findings Nonrheumatic aortic insufficiency with aortic stenosis Allergy  Arthritis Hypertension  Hyperlipidemia Pacemaker placed new 2 years ago, battery checked one month ago A-fib (HCC) Cardiomyopathy, secondary (HCC)  Sick sinus syndrome (HCC) Gout  Aortic insufficiency with aortic stenosis Tricuspid regurgitation  Sleep apnea GERD (gastroesophageal reflux disease) Skin cancer Vertigo  Chronic anticoagulation Closed  fracture of left patella Moderate mitral regurgitation by prior echocardiogram Moderate tricuspid regurgitation by prior echocardiogram  Mild pulmonary valve regurgitation Mild aortic regurgitation  Mild aortic stenosis by prior echocardiogram Mild pulmonary hypertension Idiopathic peripheral autonomic neuropathy Nonrheumatic aortic insufficiency with aortic stenosis  Diplopia       Reproductive/Obstetrics negative OB ROS                             Anesthesia Physical Anesthesia Plan  ASA: 4  Anesthesia Plan: MAC   Post-op Pain Management:    Induction: Intravenous  PONV Risk Score and Plan:   Airway Management Planned: Natural Airway and Nasal Cannula  Additional Equipment:   Intra-op Plan:   Post-operative Plan:   Informed Consent: I have reviewed the patients History and Physical, chart, labs and discussed the procedure including the risks, benefits and alternatives for the proposed anesthesia with the patient or authorized representative who has indicated his/her understanding and acceptance.     Dental Advisory Given  Plan Discussed with: Anesthesiologist, CRNA and Surgeon  Anesthesia Plan Comments: (Patient consented for risks of anesthesia including but not limited to:  - adverse reactions to medications - damage to eyes, teeth, lips or other oral mucosa - nerve damage due to positioning  - sore throat or hoarseness - Damage to heart, brain, nerves, lungs, other parts of body or loss of life  Patient voiced understanding and assent.)        Anesthesia Quick Evaluation

## 2023-11-19 ENCOUNTER — Other Ambulatory Visit: Payer: Self-pay | Admitting: Family Medicine

## 2023-11-19 DIAGNOSIS — E7800 Pure hypercholesterolemia, unspecified: Secondary | ICD-10-CM

## 2023-11-20 NOTE — Discharge Instructions (Signed)

## 2023-11-22 ENCOUNTER — Ambulatory Visit: Payer: Self-pay | Admitting: Anesthesiology

## 2023-11-22 ENCOUNTER — Encounter
Admission: RE | Disposition: A | Discharge status: Home / Self Care | Payer: Self-pay | Source: Home / Self Care | Attending: Ophthalmology

## 2023-11-22 ENCOUNTER — Encounter: Payer: Self-pay | Admitting: Ophthalmology

## 2023-11-22 ENCOUNTER — Ambulatory Visit
Admission: RE | Admit: 2023-11-22 | Discharge: 2023-11-22 | Disposition: A | Discharge status: Home / Self Care | Attending: Ophthalmology | Admitting: Ophthalmology

## 2023-11-22 ENCOUNTER — Other Ambulatory Visit: Payer: Self-pay

## 2023-11-22 DIAGNOSIS — H2511 Age-related nuclear cataract, right eye: Secondary | ICD-10-CM | POA: Insufficient documentation

## 2023-11-22 DIAGNOSIS — I10 Essential (primary) hypertension: Secondary | ICD-10-CM | POA: Insufficient documentation

## 2023-11-22 DIAGNOSIS — K219 Gastro-esophageal reflux disease without esophagitis: Secondary | ICD-10-CM | POA: Insufficient documentation

## 2023-11-22 DIAGNOSIS — Z7901 Long term (current) use of anticoagulants: Secondary | ICD-10-CM | POA: Insufficient documentation

## 2023-11-22 DIAGNOSIS — I4891 Unspecified atrial fibrillation: Secondary | ICD-10-CM | POA: Diagnosis not present

## 2023-11-22 DIAGNOSIS — I083 Combined rheumatic disorders of mitral, aortic and tricuspid valves: Secondary | ICD-10-CM | POA: Diagnosis not present

## 2023-11-22 DIAGNOSIS — Z87891 Personal history of nicotine dependence: Secondary | ICD-10-CM | POA: Diagnosis not present

## 2023-11-22 DIAGNOSIS — G4730 Sleep apnea, unspecified: Secondary | ICD-10-CM | POA: Insufficient documentation

## 2023-11-22 DIAGNOSIS — Z950 Presence of cardiac pacemaker: Secondary | ICD-10-CM | POA: Insufficient documentation

## 2023-11-22 HISTORY — DX: Nonrheumatic aortic (valve) insufficiency: I35.1

## 2023-11-22 HISTORY — DX: Nonrheumatic aortic (valve) stenosis: I35.0

## 2023-11-22 HISTORY — DX: Other idiopathic peripheral autonomic neuropathy: G90.09

## 2023-11-22 HISTORY — DX: Diplopia: H53.2

## 2023-11-22 HISTORY — DX: Pulmonary hypertension, unspecified: I27.20

## 2023-11-22 HISTORY — DX: Rheumatic tricuspid insufficiency: I07.1

## 2023-11-22 HISTORY — DX: Nonrheumatic mitral (valve) insufficiency: I34.0

## 2023-11-22 HISTORY — PX: CATARACT EXTRACTION W/PHACO: SHX586

## 2023-11-22 HISTORY — DX: Nonrheumatic pulmonary valve insufficiency: I37.1

## 2023-11-22 HISTORY — DX: Nonrheumatic aortic (valve) stenosis with insufficiency: I35.2

## 2023-11-22 SURGERY — PHACOEMULSIFICATION, CATARACT, WITH IOL INSERTION
Anesthesia: Monitor Anesthesia Care | Site: Eye | Laterality: Right

## 2023-11-22 MED ORDER — SIGHTPATH DOSE#1 NA HYALUR & NA CHOND-NA HYALUR IO KIT
PACK | INTRAOCULAR | Status: DC | PRN
  Administered 2023-11-22: 1 via OPHTHALMIC

## 2023-11-22 MED ORDER — LIDOCAINE HCL (PF) 2 % IJ SOLN
INTRAOCULAR | Status: DC | PRN
  Administered 2023-11-22: 1 mL via INTRAOCULAR

## 2023-11-22 MED ORDER — MIDAZOLAM HCL 2 MG/2ML IJ SOLN
INTRAMUSCULAR | Status: AC
Start: 2023-11-22 — End: ?
  Filled 2023-11-22: qty 2

## 2023-11-22 MED ORDER — TETRACAINE HCL 0.5 % OP SOLN
1.0000 [drp] | OPHTHALMIC | Status: DC | PRN
  Administered 2023-11-22 (×3): 1 [drp] via OPHTHALMIC

## 2023-11-22 MED ORDER — ARMC OPHTHALMIC DILATING DROPS
1.0000 | OPHTHALMIC | Status: DC | PRN
  Administered 2023-11-22 (×3): 1 via OPHTHALMIC

## 2023-11-22 MED ORDER — NA CHONDROIT SULF-NA HYALURON 40-30 MG/ML IO SOSY
INTRAOCULAR | Status: DC | PRN
  Administered 2023-11-22: .5 mL via INTRAOCULAR

## 2023-11-22 MED ORDER — ARMC OPHTHALMIC DILATING DROPS
OPHTHALMIC | Status: AC
  Filled 2023-11-22: qty 0.5

## 2023-11-22 MED ORDER — SIGHTPATH DOSE#1 BSS IO SOLN
INTRAOCULAR | Status: DC | PRN
  Administered 2023-11-22 (×3): 15 mL

## 2023-11-22 MED ORDER — BRIMONIDINE TARTRATE-TIMOLOL 0.2-0.5 % OP SOLN
OPHTHALMIC | Status: DC | PRN
  Administered 2023-11-22: 1 [drp] via OPHTHALMIC

## 2023-11-22 MED ORDER — PHENYLEPHRINE-KETOROLAC 1-0.3 % IO SOLN
INTRAOCULAR | Status: DC | PRN
  Administered 2023-11-22: 88 mL via OPHTHALMIC

## 2023-11-22 MED ORDER — MOXIFLOXACIN HCL 0.5 % OP SOLN
OPHTHALMIC | Status: DC | PRN
  Administered 2023-11-22: .2 mL via OPHTHALMIC

## 2023-11-22 MED ORDER — MIDAZOLAM HCL 2 MG/2ML IJ SOLN
INTRAMUSCULAR | Status: DC | PRN
  Administered 2023-11-22 (×2): 1 mg via INTRAVENOUS

## 2023-11-22 MED ORDER — TETRACAINE HCL 0.5 % OP SOLN
OPHTHALMIC | Status: AC
  Filled 2023-11-22: qty 4

## 2023-11-22 SURGICAL SUPPLY — 12 items
CATARACT SUITE SIGHTPATH (MISCELLANEOUS) ×1 IMPLANT
DISSECTOR HYDRO NUCLEUS 50X22 (MISCELLANEOUS) ×1 IMPLANT
DRSG TEGADERM 2-3/8X2-3/4 SM (GAUZE/BANDAGES/DRESSINGS) ×1 IMPLANT
FEE CATARACT SUITE SIGHTPATH (MISCELLANEOUS) ×1 IMPLANT
GLOVE BIOGEL PI IND STRL 8 (GLOVE) ×1 IMPLANT
GLOVE SURG LX STRL 7.5 STRW (GLOVE) ×1 IMPLANT
GLOVE SURG PROTEXIS BL SZ6.5 (GLOVE) ×1 IMPLANT
GLOVE SURG SYN 6.5 PF PI BL (GLOVE) ×1 IMPLANT
LENS IOL CLRN WGN WHL 19.0 (Intraocular Lens) IMPLANT
NDL FILTER BLUNT 18X1 1/2 (NEEDLE) ×1 IMPLANT
NEEDLE FILTER BLUNT 18X1 1/2 (NEEDLE) ×1 IMPLANT
RING MALYGIN (MISCELLANEOUS) IMPLANT

## 2023-11-22 NOTE — Op Note (Signed)
 OPERATIVE NOTE  Grant Schmitt [id number] 11/22/2023   PREOPERATIVE DIAGNOSIS: Nuclear sclerotic cataract right eye. H25.11   POSTOPERATIVE DIAGNOSIS: Nuclear sclerotic cataract right eye. H25.11   PROCEDURE:  Phacoemusification with posterior chamber intraocular lens placement of the right eye  Ultrasound time: Procedure(s): PHACOEMULSIFICATION, CATARACT, WITH IOL INSERTION Omidria Malyugin 15.18  01:27.0 (Right)  LENS:   Implant Name Type Inv. Item Serial No. Manufacturer Lot No. LRB No. Used Action  LENS IOL CLRN WGN WHL 19.0 - [id number] Intraocular Lens LENS IOL CLRN WGN WHL 19.0 [id number] [employer]  Right 1 Implanted      SURGEON:  Rosy Cooper. Donalda Fruit, MD   ANESTHESIA:  Topical with tetracaine drops, augmented with 1% preservative-free intracameral lidocaine .   COMPLICATIONS:  None.   DESCRIPTION OF PROCEDURE:  The patient was identified in the holding room and transported to the operating room and placed in the supine position under the operating microscope.  The right eye was identified as the operative eye, which was prepped and draped in the usual sterile ophthalmic fashion.   A 1 millimeter clear-corneal paracentesis was made superotemporally. Preservative-free 1% lidocaine  mixed with 1:1,000 bisulfite-free aqueous solution of epinephrine  was injected into the anterior chamber. The anterior chamber was then filled with Viscoat viscoelastic. A 2.4 millimeter keratome was used to make a clear-corneal incision inferotemporally. The pupil remained extremely miotic, so a Malyugin ring was inserted and engaged the pupil margin to achieve adequate mydriasis for the remainder of the surgery. A curvilinear capsulorrhexis was made with a cystotome and capsulorrhexis forceps. Balanced salt solution was used to hydrodissect and hydrodelineate the nucleus. Phacoemulsification was then used to remove the lens nucleus and epinucleus. The remaining cortex was then removed using the irrigation  and aspiration handpiece. Provisc was then placed into the capsular bag to distend it for lens placement. A +19.00 D [id number] intraocular lens was then injected into the capsular bag. The Malyugin ring was disengaged from the pupil margin and removed from the eye. The remaining viscoelastic was aspirated.   Wounds were hydrated with balanced salt solution.  The anterior chamber was inflated to a physiologic pressure with balanced salt solution.  No wound leaks were noted. Moxifloxacin was injected intracamerally.  Timolol and Brimonidine drops were applied to the eye.  The patient was taken to the recovery room in stable condition without complications of anesthesia or surgery.  Grant Schmitt 11/22/2023, 1:58 PM

## 2023-11-22 NOTE — Anesthesia Postprocedure Evaluation (Signed)
 Anesthesia Post Note  Patient: Fremon Zacharia  Procedure(s) Performed: PHACOEMULSIFICATION, CATARACT, WITH IOL INSERTION Omidria Malyugin 15.18  01:27.0 (Right: Eye)  Patient location during evaluation: PACU Anesthesia Type: MAC Level of consciousness: awake and alert Pain management: pain level controlled Vital Signs Assessment: post-procedure vital signs reviewed and stable Respiratory status: spontaneous breathing, nonlabored ventilation, respiratory function stable and patient connected to nasal cannula oxygen Cardiovascular status: stable and blood pressure returned to baseline Postop Assessment: no apparent nausea or vomiting Anesthetic complications: no   No notable events documented.   Last Vitals:  Vitals:   11/22/23 1403 11/22/23 1405  BP: (!) 147/90 (!) 149/88  Pulse: 68 (!) 57  Resp: 11 12  Temp:    SpO2: 100% 99%    Last Pain:  Vitals:   11/22/23 1403  TempSrc:   PainSc: 0-No pain                 Santanna Whitford C Hattye Siegfried

## 2023-11-22 NOTE — Transfer of Care (Signed)
 Immediate Anesthesia Transfer of Care Note  Patient: Grant Schmitt  Procedure(s) Performed: PHACOEMULSIFICATION, CATARACT, WITH IOL INSERTION Omidria Malyugin 15.18  01:27.0 (Right: Eye)  Patient Location: PACU  Anesthesia Type: MAC  Level of Consciousness: awake, alert  and patient cooperative  Airway and Oxygen Therapy: Patient Spontanous Breathing and Patient connected to supplemental oxygen  Post-op Assessment: Post-op Vital signs reviewed, Patient's Cardiovascular Status Stable, Respiratory Function Stable, Patent Airway and No signs of Nausea or vomiting  Post-op Vital Signs: Reviewed and stable  Complications: No notable events documented.

## 2023-11-22 NOTE — H&P (Signed)
 [facility]   Primary Care Physician:  Lamon Pillow, MD Ophthalmologist: Dr. Meg Spina  Pre-Procedure History & Physical: HPI:  Grant Schmitt is a 89 y.o. male here for cataract surgery.   Past Medical History:  Diagnosis Date   A-fib [facility])    Allergy    Aortic insufficiency with aortic stenosis    Arthritis    Cardiomyopathy, secondary (HCC)    Chronic anticoagulation    Rivaroxaban    Closed fracture of left patella 02/22/2022   Diplopia    GERD (gastroesophageal reflux disease)    occ-tums prn   Gout    H/O hemorrhoidectomy 1984   History of chickenpox    Hyperlipidemia    Hypertension    Idiopathic peripheral autonomic neuropathy    Mild aortic regurgitation    Mild aortic stenosis by prior echocardiogram    Mild pulmonary hypertension (HCC)    Mild pulmonary valve regurgitation    Moderate mitral regurgitation by prior echocardiogram    Moderate tricuspid regurgitation by prior echocardiogram    Nonrheumatic aortic insufficiency with aortic stenosis    Pacemaker    Sick sinus syndrome (HCC)    Skin cancer    Sleep apnea    does not use cpap    Tricuspid regurgitation    Vertigo     Past Surgical History:  Procedure Laterality Date   CATARACT EXTRACTION W/ INTRAOCULAR LENS IMPLANT Left    COLONOSCOPY     COLONOSCOPY W/ POLYPECTOMY     EYE SURGERY     hemorrhoidectomy  1984   IMPLANTABLE CARDIOVERTER DEFIBRILLATOR (ICD) GENERATOR CHANGE Left 06/26/2018   Procedure: SINGLE CHAMBER BATTERY CHANGEOUT;  Surgeon: Percival Brace, MD;  Location: [facility] ORS;  Service: Cardiovascular;  Laterality: Left;   INSERT / REPLACE / REMOVE PACEMAKER  2012   MOHS SURGERY Right 05/14/2018   ear. Dr. Eva Hikes [facility]   REPLACEMENT TOTAL KNEE Right 2005   TOTAL KNEE ARTHROPLASTY Left 11/29/2020   Procedure: TOTAL KNEE ARTHROPLASTY;  Surgeon: Jerlyn Moons, MD;  Location: [facility] ORS;  Service: Orthopedics;  Laterality: Left;    Prior to Admission  medications   Medication Sig Start Date End Date Taking? Authorizing Provider  allopurinol  (ZYLOPRIM ) 300 MG tablet TAKE 1 TABLET BY MOUTH DAILY 07/13/23  Yes Lamon Pillow, MD  Ascorbic Acid (VITAMIN C) 1000 MG tablet Take 1,000 mg by mouth daily.   Yes [provider]  Cholecalciferol (VITAMIN D3) 2000 units TABS Take 2,000 Units by mouth daily.    Yes [provider]  docusate sodium  (COLACE) 100 MG capsule Take 1 capsule (100 mg total) by mouth 2 (two) times daily. 12/01/20  Yes Maud Sorenson, PA-C  dutasteride  (AVODART ) 0.5 MG capsule Take 1 capsule (0.5 mg total) by mouth daily. 09/27/23  Yes Lamon Pillow, MD  metoprolol  tartrate (LOPRESSOR ) 50 MG tablet Take 1 tablet (50 mg total) by mouth daily. 11/15/23  Yes Lamon Pillow, MD  rOPINIRole (REQUIP) 0.5 MG tablet Take 0.5 mg by mouth at bedtime.   Yes [provider]  simvastatin  (ZOCOR ) 20 MG tablet TAKE 1 TABLET BY MOUTH DAILY 09/11/23  Yes Lamon Pillow, MD  tamsulosin  (FLOMAX ) 0.4 MG CAPS capsule TAKE 1 CAPSULE BY MOUTH DAILY 07/13/23  Yes Lamon Pillow, MD  tiZANidine  (ZANAFLEX ) 4 MG tablet Take 1 tablet (4 mg total) by mouth every 6 (six) hours as needed for muscle spasms. 12/15/22  Yes Simmons-Robinson, Judyann Number, MD  XARELTO  20 MG TABS  tablet Take 1 tablet (20 mg total) by mouth daily. 11/30/16  Yes Lamon Pillow, MD  clotrimazole -betamethasone  (LOTRISONE ) lotion APPLY  LOTION TOPICALLY TWICE DAILY Patient not taking: Reported on 11/16/2023 11/16/20   Lamon Pillow, MD  losartan  (COZAAR ) 50 MG tablet TAKE 1 TABLET BY MOUTH IN THE  MORNING 07/13/23   Lamon Pillow, MD    Allergies as of 11/16/2023   (No Known Allergies)    History reviewed. No pertinent family history.  Social History   Socioeconomic History   Marital status: Widowed    Spouse name: Not on file   Number of children: 2   Years of education: 49   Highest education level: 12th grade  Occupational History   Occupation:  Retired  Tobacco Use   Smoking status: Former    Current packs/day: 0.00    Average packs/day: 1 pack/day for 20.0 years (20.0 ttl pk-yrs)    Types: Cigarettes    Start date: 06/19/1938    Quit date: 06/19/1958    Years since quitting: 65.4   Smokeless tobacco: Never  Vaping Use   Vaping status: Never Used  Substance and Sexual Activity   Alcohol use: Not Currently    Alcohol/week: 0.0 - 1.0 standard drinks of alcohol   Drug use: No    Comment: occ beer   Sexual activity: Not on file  Other Topics Concern   Not on file  Social History Narrative   Lives at [facility]   Social Drivers of Health   Financial Resource Strain: Low Risk  (04/18/2023)   Overall Financial Resource Strain (CARDIA)    Difficulty of Paying Living Expenses: Not hard at all  Food Insecurity: No Food Insecurity (04/18/2023)   Hunger Vital Sign    Worried About Running Out of Food in the Last Year: Never true    Ran Out of Food in the Last Year: Never true  Transportation Needs: No Transportation Needs (04/18/2023)   PRAPARE - Administrator, Civil Service (Medical): No    Lack of Transportation (Non-Medical): No  Physical Activity: Insufficiently Active (04/18/2023)   Exercise Vital Sign    Days of Exercise per Week: 3 days    Minutes of Exercise per Session: 30 min  Stress: No Stress Concern Present (04/18/2023)   [employer] of Occupational Health - Occupational Stress Questionnaire    Feeling of Stress : Not at all  Social Connections: Socially Isolated (04/18/2023)   Social Connection and Isolation Panel [NHANES]    Frequency of Communication with Friends and Family: More than three times a week    Frequency of Social Gatherings with Friends and Family: Once a week    Attends Religious Services: Never    Database administrator or Organizations: No    Attends Banker Meetings: Never    Marital Status: Widowed  Intimate Partner Violence: Not At Risk (04/18/2023)    Humiliation, Afraid, Rape, and Kick questionnaire    Fear of Current or Ex-Partner: No    Emotionally Abused: No    Physically Abused: No    Sexually Abused: No    Review of Systems: See HPI, otherwise negative ROS  Physical Exam: Ht 5\' 11"  (1.803 m)   Wt 97.5 kg   BMI 29.99 kg/m  General:   Alert, cooperative in NAD Head:  Normocephalic and atraumatic. Respiratory:  Normal work of breathing. Cardiovascular:  RRR  Impression/Plan: Grant Schmitt is here for cataract surgery.  Risks, benefits, limitations, and  alternatives regarding cataract surgery have been reviewed with the patient.  Questions have been answered.  All parties agreeable.   Trudi Fus, MD  11/22/2023, 7:11 AM

## 2023-11-23 ENCOUNTER — Encounter: Payer: Self-pay | Admitting: Ophthalmology

## 2023-12-21 ENCOUNTER — Other Ambulatory Visit: Payer: Self-pay | Admitting: Family Medicine

## 2023-12-21 DIAGNOSIS — N400 Enlarged prostate without lower urinary tract symptoms: Secondary | ICD-10-CM

## 2023-12-21 DIAGNOSIS — I10 Essential (primary) hypertension: Secondary | ICD-10-CM

## 2023-12-21 DIAGNOSIS — Z8739 Personal history of other diseases of the musculoskeletal system and connective tissue: Secondary | ICD-10-CM

## 2023-12-28 ENCOUNTER — Ambulatory Visit: Admitting: Family Medicine

## 2023-12-28 ENCOUNTER — Other Ambulatory Visit: Payer: Self-pay | Admitting: Family Medicine

## 2023-12-28 ENCOUNTER — Encounter: Payer: Self-pay | Admitting: Family Medicine

## 2023-12-28 VITALS — BP 132/80 | HR 72 | Resp 16 | Ht 71.0 in | Wt 225.0 lb

## 2023-12-28 DIAGNOSIS — N400 Enlarged prostate without lower urinary tract symptoms: Secondary | ICD-10-CM

## 2023-12-28 DIAGNOSIS — I10 Essential (primary) hypertension: Secondary | ICD-10-CM

## 2023-12-28 DIAGNOSIS — R7989 Other specified abnormal findings of blood chemistry: Secondary | ICD-10-CM

## 2023-12-28 DIAGNOSIS — R351 Nocturia: Secondary | ICD-10-CM

## 2023-12-28 DIAGNOSIS — N401 Enlarged prostate with lower urinary tract symptoms: Secondary | ICD-10-CM | POA: Diagnosis not present

## 2023-12-28 DIAGNOSIS — I4891 Unspecified atrial fibrillation: Secondary | ICD-10-CM

## 2023-12-28 MED ORDER — DUTASTERIDE 0.5 MG PO CAPS: 0.5000 mg | ORAL_CAPSULE | Freq: Every day | ORAL | 4 refills | Status: AC

## 2023-12-28 NOTE — Progress Notes (Signed)
 Established patient visit   Patient: Grant Schmitt   DOB: 05-10-1929   89 y.o. Male  MRN: [id number] Visit Date: 12/28/2023  Today's healthcare provider: Nancyann Perry, MD   Chief Complaint  Patient presents with   Medical Management of Chronic Issues    HTN follow-up   Subjective    HPI Patient presents for follow up hypertension, CKD, and BPH. Dutasteride  was added to tamsulosin  for BPH at his last visit in April and he reports significant improvement in symptoms. He was at [place] recently where he is given Xarelto  for atrial fibrillation. He states no labs were checked at that visit.   Lab Results  Component Value Date   NA 140 09/26/2023   K 5.0 09/26/2023   CREATININE 1.31 (H) 09/26/2023   EGFR 51 (L) 09/26/2023   GLUCOSE 98 09/26/2023       Medications: Outpatient Medications Prior to Visit  Medication Sig   allopurinol  (ZYLOPRIM ) 300 MG tablet TAKE 1 TABLET BY MOUTH DAILY   Ascorbic Acid (VITAMIN C) 1000 MG tablet Take 1,000 mg by mouth daily.   Cholecalciferol (VITAMIN D3) 2000 units TABS Take 2,000 Units by mouth daily.    clotrimazole -betamethasone  (LOTRISONE ) lotion APPLY  LOTION TOPICALLY TWICE DAILY   docusate sodium  (COLACE) 100 MG capsule Take 1 capsule (100 mg total) by mouth 2 (two) times daily.   gabapentin (NEURONTIN) 100 MG capsule Take 100 mg by mouth at bedtime.   losartan  (COZAAR ) 50 MG tablet TAKE 1 TABLET BY MOUTH IN THE  MORNING   metoprolol  tartrate (LOPRESSOR ) 50 MG tablet Take 1 tablet (50 mg total) by mouth daily.   rOPINIRole (REQUIP) 0.5 MG tablet Take 0.5 mg by mouth at bedtime.   simvastatin  (ZOCOR ) 20 MG tablet TAKE 1 TABLET BY MOUTH DAILY   tamsulosin  (FLOMAX ) 0.4 MG CAPS capsule TAKE 1 CAPSULE BY MOUTH DAILY   tiZANidine  (ZANAFLEX ) 4 MG tablet Take 1 tablet (4 mg total) by mouth every 6 (six) hours as needed for muscle spasms.   XARELTO  20 MG TABS tablet Take 1 tablet (20 mg total) by mouth daily.   dutasteride  (AVODART ) 0.5 MG  capsule Take 1 capsule (0.5 mg total) by mouth daily.   cyanocobalamin (VITAMIN B12) 1000 MCG tablet Take 1 tablet by mouth daily.   No facility-administered medications prior to visit.    Review of Systems  Constitutional:  Negative for appetite change, chills and fever.  Respiratory:  Negative for chest tightness, shortness of breath and wheezing.   Cardiovascular:  Negative for chest pain and palpitations.  Gastrointestinal:  Negative for abdominal pain, nausea and vomiting.       Objective    BP 132/80 (BP Location: Left Arm, Patient Position: Sitting, Cuff Size: Normal)   Pulse 72   Resp 16   Ht 5' 11 (1.803 m)   Wt 225 lb (102.1 kg)   SpO2 98%   BMI 31.38 kg/m    Physical Exam   General appearance: Mildly obese male, cooperative and in no acute distress Head: Normocephalic, without obvious abnormality, atraumatic Respiratory: Respirations even and unlabored, normal respiratory rate Extremities: All extremities are intact.  Skin: Skin color, texture, turgor normal. No rashes seen  Psych: Appropriate mood and affect. Neurologic: Mental status: Alert, oriented to person, place, and time, thought content appropriate.    Assessment & Plan     1. Benign prostatic hyperplasia with lower urinary tract symptoms, symptom details unspecified (Primary)  2. Nocturia Much improved since adding  dutasteride  to tamsulosin  - dutasteride  (AVODART ) 0.5 MG capsule; Take 1 capsule (0.5 mg total) by mouth daily.  Dispense: 90 capsule; Refill: 4  3. Primary hypertension Well controlled.  Continue current medications.    4. Elevated serum creatinine  - Renal function panel        Nancyann Perry, MD  [facility] [phone] (phone) [phone] (fax)  [facility]

## 2023-12-28 NOTE — Telephone Encounter (Unsigned)
 Copied from CRM [id number]. Topic: Clinical - Medication Refill >> Dec 28, 2023  1:16 PM Nathanel C wrote: Medication:  metoprolol  tartrate (LOPRESSOR ) 50 MG tablet tamsulosin  (FLOMAX ) 0.4 MG CAPS capsule   Has the patient contacted their pharmacy? No  This is the patient's preferred pharmacy:   [facility] - [place], [place] - [address] [facility] [address] [place] [address] [place] [address] Phone: [phone] Fax: [phone]  Is this the correct pharmacy for this prescription? Yes If no, delete pharmacy and type the correct one.   Has the prescription been filled recently? Yes  Is the patient out of the medication? Yes  Has the patient been seen for an appointment in the last year OR does the patient have an upcoming appointment? Yes  Can we respond through MyChart? No  Agent: Please be advised that Rx refills may take up to 3 business days. We ask that you follow-up with your pharmacy.

## 2023-12-29 LAB — RENAL FUNCTION PANEL
Albumin: 4 g/dL (ref 3.6–4.6)
BUN/Creatinine Ratio: 32 — ABNORMAL HIGH (ref 10–24)
BUN: 43 mg/dL — ABNORMAL HIGH (ref 10–36)
CO2: 20 mmol/L (ref 20–29)
Calcium: 9.9 mg/dL (ref 8.6–10.2)
Chloride: 103 mmol/L (ref 96–106)
Creatinine, Ser: 1.34 mg/dL — ABNORMAL HIGH (ref 0.76–1.27)
Glucose: 93 mg/dL (ref 70–99)
Phosphorus: 3.7 mg/dL (ref 2.8–4.1)
Potassium: 4.9 mmol/L (ref 3.5–5.2)
Sodium: 141 mmol/L (ref 134–144)
eGFR: 50 mL/min/1.73 — ABNORMAL LOW (ref >59)

## 2023-12-30 ENCOUNTER — Ambulatory Visit: Payer: Self-pay | Admitting: Family Medicine

## 2023-12-31 ENCOUNTER — Telehealth: Payer: Self-pay

## 2023-12-31 MED ORDER — METOPROLOL TARTRATE 50 MG PO TABS: 50.0000 mg | ORAL_TABLET | Freq: Every day | ORAL | 1 refills | Status: AC

## 2023-12-31 NOTE — Telephone Encounter (Signed)
 Requested Prescriptions  Pending Prescriptions Disp Refills   tamsulosin  (FLOMAX ) 0.4 MG CAPS capsule 100 capsule 1    Sig: Take 1 capsule (0.4 mg total) by mouth daily.     Urology: Alpha-Adrenergic Blocker Passed - 12/31/2023  1:48 PM      Passed - PSA in normal range and within 360 days    Prostate Specific Ag, Serum  Date Value Ref Range Status  09/26/2023 0.7 0.0 - 4.0 ng/mL Final    Comment:    Roche ECLIA methodology. According to the American Urological Association, Serum PSA should decrease and remain at undetectable levels after radical prostatectomy. The AUA defines biochemical recurrence as an initial PSA value 0.2 ng/mL or greater followed by a subsequent confirmatory PSA value 0.2 ng/mL or greater. Values obtained with different assay methods or kits cannot be used interchangeably. Results cannot be interpreted as absolute evidence of the presence or absence of malignant disease.          Passed - Last BP in normal range    BP Readings from Last 1 Encounters:  12/28/23 132/80         Passed - Valid encounter within last 12 months    Recent Outpatient Visits           3 days ago Benign prostatic hyperplasia with lower urinary tract symptoms, symptom details unspecified   [facility] Nancyann BRAVO, MD   3 months ago Primary hypertension   [facility] Gas[facility] Nancyann BRAVO, MD               metoprolol  tartrate (LOPRESSOR ) 50 MG tablet 100 tablet 1    Sig: Take 1 tablet (50 mg total) by mouth daily.     Cardiovascular:  Beta Blockers Passed - 12/31/2023  1:48 PM      Passed - Last BP in normal range    BP Readings from Last 1 Encounters:  12/28/23 132/80         Passed - Last Heart Rate in normal range    Pulse Readings from Last 1 Encounters:  12/28/23 72         Passed - Valid encounter within last 6 months    Recent Outpatient Visits           3 days ago Benign prostatic hyperplasia with  lower urinary tract symptoms, symptom details unspecified   [facility] Nancyann BRAVO, MD   3 months ago Primary hypertension   [facility] Gas[facility] Nancyann BRAVO, MD

## 2023-12-31 NOTE — Telephone Encounter (Signed)
 Refilled 12/22/23. Requested Prescriptions  Signed Prescriptions Disp Refills   metoprolol  tartrate (LOPRESSOR ) 50 MG tablet 100 tablet 1    Sig: Take 1 tablet (50 mg total) by mouth daily.     Cardiovascular:  Beta Blockers Passed - 12/31/2023  1:50 PM      Passed - Last BP in normal range    BP Readings from Last 1 Encounters:  12/28/23 132/80         Passed - Last Heart Rate in normal range    Pulse Readings from Last 1 Encounters:  12/28/23 72         Passed - Valid encounter within last 6 months    Recent Outpatient Visits           3 days ago Benign prostatic hyperplasia with lower urinary tract symptoms, symptom details unspecified   [facility] Gas[facility] Nancyann BRAVO, MD   3 months ago Primary hypertension   [facility] Gas[facility] Nancyann BRAVO, MD              Refused Prescriptions Disp Refills   tamsulosin  (FLOMAX ) 0.4 MG CAPS capsule 100 capsule 1    Sig: Take 1 capsule (0.4 mg total) by mouth daily.     Urology: Alpha-Adrenergic Blocker Passed - 12/31/2023  1:50 PM      Passed - PSA in normal range and within 360 days    Prostate Specific Ag, Serum  Date Value Ref Range Status  09/26/2023 0.7 0.0 - 4.0 ng/mL Final    Comment:    Roche ECLIA methodology. According to the American Urological Association, Serum PSA should decrease and remain at undetectable levels after radical prostatectomy. The AUA defines biochemical recurrence as an initial PSA value 0.2 ng/mL or greater followed by a subsequent confirmatory PSA value 0.2 ng/mL or greater. Values obtained with different assay methods or kits cannot be used interchangeably. Results cannot be interpreted as absolute evidence of the presence or absence of malignant disease.          Passed - Last BP in normal range    BP Readings from Last 1 Encounters:  12/28/23 132/80         Passed - Valid encounter within last 12 months    Recent Outpatient Visits            3 days ago Benign prostatic hyperplasia with lower urinary tract symptoms, symptom details unspecified   [facility] Nancyann BRAVO, MD   3 months ago Primary hypertension   [facility] Gas[facility] Nancyann BRAVO, MD

## 2023-12-31 NOTE — Telephone Encounter (Signed)
 FYI for provider.  Medication list reviewed and we do have these medications listed with same dose except for the Metoprolol .  He has 2 daily we have 1 daily    Copied from CRM [id number]. Topic: Clinical - Medication Question >> Dec 28, 2023  1:13 PM Grant Schmitt wrote: Reason for CRM:  losartan  (COZAAR ) 50 MG tablet metoprolol  tartrate (LOPRESSOR ) 50 MG tablet x2 simvastatin  (ZOCOR ) 20 MG tablet  Pt called and wanted to verify that this is the medication that he has at home and that he is taking,

## 2024-01-09 ENCOUNTER — Other Ambulatory Visit: Payer: Self-pay | Admitting: Family Medicine

## 2024-01-09 DIAGNOSIS — E7800 Pure hypercholesterolemia, unspecified: Secondary | ICD-10-CM

## 2024-03-04 ENCOUNTER — Other Ambulatory Visit: Payer: Self-pay | Admitting: Family Medicine

## 2024-03-04 DIAGNOSIS — Z8739 Personal history of other diseases of the musculoskeletal system and connective tissue: Secondary | ICD-10-CM

## 2024-03-05 DIAGNOSIS — L578 Other skin changes due to chronic exposure to nonionizing radiation: Secondary | ICD-10-CM | POA: Diagnosis not present

## 2024-03-05 DIAGNOSIS — C44629 Squamous cell carcinoma of skin of left upper limb, including shoulder: Secondary | ICD-10-CM | POA: Diagnosis not present

## 2024-03-05 DIAGNOSIS — D485 Neoplasm of uncertain behavior of skin: Secondary | ICD-10-CM | POA: Diagnosis not present

## 2024-04-23 ENCOUNTER — Ambulatory Visit: Payer: Medicare Other

## 2024-04-25 NOTE — Progress Notes (Signed)
 Pharmacy Quality Measure Review  This patient is appearing on a report for being at risk of failing the adherence measure for cholesterol (statin) medications this calendar year.   Medication: simvastatin   Last fill date: 04/18/24 for 100 day supply  Insurance report was not up to date. No action needed at this time.   Armelia Penton E. Marsh, PharmD, CPP Clinical Pharmacist [facility] [phone]

## 2024-04-30 ENCOUNTER — Ambulatory Visit
Admission: RE | Admit: 2024-04-30 | Discharge: 2024-04-30 | Disposition: A | Discharge status: Home / Self Care | Source: Ambulatory Visit | Attending: Radiation Oncology | Admitting: Radiation Oncology

## 2024-04-30 ENCOUNTER — Encounter: Payer: Self-pay | Admitting: Radiation Oncology

## 2024-04-30 VITALS — BP 128/81 | HR 82 | Temp 98.6°F | Resp 20 | Wt 225.0 lb

## 2024-04-30 DIAGNOSIS — C4490 Unspecified malignant neoplasm of skin, unspecified: Secondary | ICD-10-CM

## 2024-04-30 NOTE — Consult Note (Signed)
 NEW PATIENT EVALUATION  Name: Grant Schmitt  MRN: [id number]  Date:   04/30/2024     DOB: 11/29/1928   This 89 y.o. 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  Status post Mohs chemosurgery  REFERRING PHYSICIAN: Gasper Nancyann BRAVO, MD  CHIEF COMPLAINT:  Chief Complaint  Patient presents with   Skin Cancer    DIAGNOSIS: There were no encounter diagnoses.   PREVIOUS INVESTIGATIONS:  Pathology reports reviewed Clinical notes reviewed  HPI: Patient is a 89 year old male established patient of Dr. Gladis who presented with a mass on the left cymba concha.  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  Perineural invasion was identified focally.  Based on the location and extent of its excision he is referred to radiation oncology for consideration of treatment.  He is asymptomatic.  Does have another left forearm lesion which is scheduled for excision in the near future by dermatology.  PLANNED TREATMENT REGIMEN: Electron-beam therapy  PAST MEDICAL HISTORY:  has a past medical history of A-fib (HCC), Allergy, Aortic insufficiency with aortic stenosis, Arthritis, Cardiomyopathy, secondary (HCC), Chronic anticoagulation, Closed fracture of left patella (02/22/2022), Diplopia, GERD (gastroesophageal reflux disease), Gout, H/O hemorrhoidectomy (1984), History of chickenpox, Hyperlipidemia, Hypertension, Idiopathic peripheral autonomic neuropathy, Mild aortic regurgitation, Mild aortic stenosis by prior echocardiogram, Mild pulmonary hypertension (HCC), Mild pulmonary valve regurgitation, Moderate mitral regurgitation by prior echocardiogram, Moderate tricuspid regurgitation by prior echocardiogram, Nonrheumatic aortic insufficiency with aortic  stenosis, Pacemaker, Sick sinus syndrome (HCC), Skin cancer, Sleep apnea, Tricuspid regurgitation, and Vertigo.    PAST SURGICAL HISTORY:  Past Surgical History:  Procedure Laterality Date   CATARACT EXTRACTION W/ INTRAOCULAR LENS IMPLANT Left    CATARACT EXTRACTION W/PHACO Right 11/22/2023   Procedure: PHACOEMULSIFICATION, CATARACT, WITH IOL INSERTION Omidria  Malyugin 15.18  01:27.0;  Surgeon: Enola Feliciano Hugger, MD;  Location: [facility];  Service: Ophthalmology;  Laterality: Right;   COLONOSCOPY     COLONOSCOPY W/ POLYPECTOMY     EYE SURGERY     hemorrhoidectomy  1984   IMPLANTABLE CARDIOVERTER DEFIBRILLATOR (ICD) GENERATOR CHANGE Left 06/26/2018   Procedure: SINGLE CHAMBER BATTERY CHANGEOUT;  Surgeon: Ammon Blunt, MD;  Location: [facility];  Service: Cardiovascular;  Laterality: Left;   INSERT / REPLACE / REMOVE PACEMAKER  2012   MOHS SURGERY Right 05/14/2018   ear. Dr. Bette [facility]   REPLACEMENT TOTAL KNEE Right 2005   TOTAL KNEE ARTHROPLASTY Left 11/29/2020   Procedure: TOTAL KNEE ARTHROPLASTY;  Surgeon: Leora Lynwood SAUNDERS, MD;  Location: [facility];  Service: Orthopedics;  Laterality: Left;    FAMILY HISTORY: family history is not on file.  SOCIAL HISTORY:  reports that he quit smoking about 65 years ago. His smoking use included cigarettes. He started smoking about 85 years ago. He has a 20 pack-year smoking history. He has never used smokeless tobacco. He reports that he does not currently use alcohol. He reports that he does not use drugs.  ALLERGIES: Patient has no known allergies.  MEDICATIONS:  Current Outpatient Medications  Medication Sig Dispense Refill   allopurinol  (ZYLOPRIM ) 300 MG tablet TAKE 1 TABLET BY MOUTH DAILY 100 tablet 3   Ascorbic Acid (VITAMIN C) 1000 MG tablet Take 1,000 mg by mouth daily.     Cholecalciferol (VITAMIN D3) 2000 units TABS Take 2,000 Units by mouth  daily.      clotrimazole -betamethasone  (LOTRISONE ) lotion APPLY   LOTION TOPICALLY TWICE DAILY 30 mL 0   cyanocobalamin (VITAMIN B12) 1000 MCG tablet Take 1 tablet by mouth daily.     docusate sodium  (COLACE) 100 MG capsule Take 1 capsule (100 mg total) by mouth 2 (two) times daily. 30 capsule 0   dutasteride  (AVODART ) 0.5 MG capsule Take 1 capsule (0.5 mg total) by mouth daily. 90 capsule 4   gabapentin (NEURONTIN) 100 MG capsule Take 100 mg by mouth at bedtime.     losartan  (COZAAR ) 50 MG tablet TAKE 1 TABLET BY MOUTH IN THE  MORNING 100 tablet 2   metoprolol  tartrate (LOPRESSOR ) 50 MG tablet Take 1 tablet (50 mg total) by mouth daily. 100 tablet 1   rOPINIRole (REQUIP) 0.5 MG tablet Take 0.5 mg by mouth at bedtime.     simvastatin  (ZOCOR ) 20 MG tablet TAKE 1 TABLET BY MOUTH DAILY 100 tablet 2   tamsulosin  (FLOMAX ) 0.4 MG CAPS capsule TAKE 1 CAPSULE BY MOUTH DAILY 100 capsule 1   tiZANidine  (ZANAFLEX ) 4 MG tablet Take 1 tablet (4 mg total) by mouth every 6 (six) hours as needed for muscle spasms. 30 tablet 0   XARELTO  20 MG TABS tablet Take 1 tablet (20 mg total) by mouth daily. 90 tablet 4   No current facility-administered medications for this encounter.    ECOG PERFORMANCE STATUS:  0 - Asymptomatic  REVIEW OF SYSTEMS: Patient denies any weight loss, fatigue, weakness, fever, chills or night sweats. Patient denies any loss of vision, blurred vision. Patient denies any ringing  of the ears or hearing loss. No irregular heartbeat. Patient denies heart murmur or history of fainting. Patient denies any chest pain or pain radiating to her upper extremities. Patient denies any shortness of breath, difficulty breathing at night, cough or hemoptysis. Patient denies any swelling in the lower legs. Patient denies any nausea vomiting, vomiting of blood, or coffee ground material in the vomitus. Patient denies any stomach pain. Patient states has had normal bowel movements no significant constipation or diarrhea. Patient denies any dysuria, hematuria or significant  nocturia. Patient denies any problems walking, swelling in the joints or loss of balance. Patient denies any skin changes, loss of hair or loss of weight. Patient denies any excessive worrying or anxiety or significant depression. Patient denies any problems with insomnia. Patient denies excessive thirst, polyuria, polydipsia. Patient denies any swollen glands, patient denies easy bruising or easy bleeding. Patient denies any recent infections, allergies or URI. Patient s visual fields have not changed significantly in recent time.   PHYSICAL EXAM: BP 128/81   Pulse 82   Temp 98.6 F (37 C) (Tympanic)   Resp 20   Wt 225 lb (102.1 kg)   BMI 31.38 kg/m  Small area of ulceration on the left cymba concha with evidence of recent excision.  No palpable nodules noted.  No evidence of adenopathy in the head and neck region is identified.  Well-developed well-nourished patient in NAD. HEENT reveals PERLA, EOMI, discs not visualized.  Oral cavity is clear. No oral mucosal lesions are identified. Neck is clear without evidence of cervical or supraclavicular adenopathy. Lungs are clear to A&P. Cardiac examination is essentially unremarkable with regular rate and rhythm without murmur rub or thrill. Abdomen is benign with no organomegaly or masses noted. Motor sensory and DTR levels are equal and symmetric in the upper and lower extremities. Cranial nerves II through XII are grossly intact. Proprioception is intact.  No peripheral adenopathy or edema is identified. No motor or sensory levels are noted. Crude visual fields are within normal range.  LABORATORY DATA: Pathology reports reviewed    RADIOLOGY RESULTS: No current films for review   IMPRESSION: Poorly differentiated squamous cell carcinoma of left ear post excision with perineural invasion in 89 year old male  PLAN: At this time would like to go ahead with electron-beam therapy to his left ear.  Would plan on delivering 40 Gray in 20 fractions based  on the small size of the area involved using electron beam therapy.  Risks and benefits of treatment including small skin reaction were reviewed with the patient.  I have personally set up and ordered CT simulation.  Patient comprehends my recommendations well.  I would like to take this opportunity to thank you for allowing me to participate in the care of your patient.SABRA Marcey Penton, MD

## 2024-05-10 ENCOUNTER — Emergency Department
Admission: EM | Admit: 2024-05-10 | Discharge: 2024-05-10 | Disposition: A | Discharge status: Home / Self Care | Attending: Emergency Medicine | Admitting: Emergency Medicine

## 2024-05-10 ENCOUNTER — Other Ambulatory Visit: Payer: Self-pay

## 2024-05-10 ENCOUNTER — Emergency Department

## 2024-05-10 ENCOUNTER — Encounter: Payer: Self-pay | Admitting: Emergency Medicine

## 2024-05-10 DIAGNOSIS — W19XXXA Unspecified fall, initial encounter: Secondary | ICD-10-CM | POA: Insufficient documentation

## 2024-05-10 DIAGNOSIS — N189 Chronic kidney disease, unspecified: Secondary | ICD-10-CM | POA: Insufficient documentation

## 2024-05-10 DIAGNOSIS — I4891 Unspecified atrial fibrillation: Secondary | ICD-10-CM | POA: Insufficient documentation

## 2024-05-10 DIAGNOSIS — R112 Nausea with vomiting, unspecified: Secondary | ICD-10-CM | POA: Diagnosis not present

## 2024-05-10 DIAGNOSIS — R1110 Vomiting, unspecified: Secondary | ICD-10-CM | POA: Diagnosis present

## 2024-05-10 DIAGNOSIS — Y92002 Bathroom of unspecified non-institutional (private) residence single-family (private) house as the place of occurrence of the external cause: Secondary | ICD-10-CM | POA: Diagnosis not present

## 2024-05-10 DIAGNOSIS — N3000 Acute cystitis without hematuria: Secondary | ICD-10-CM | POA: Insufficient documentation

## 2024-05-10 DIAGNOSIS — Z7901 Long term (current) use of anticoagulants: Secondary | ICD-10-CM | POA: Insufficient documentation

## 2024-05-10 DIAGNOSIS — I129 Hypertensive chronic kidney disease with stage 1 through stage 4 chronic kidney disease, or unspecified chronic kidney disease: Secondary | ICD-10-CM | POA: Diagnosis not present

## 2024-05-10 DIAGNOSIS — R42 Dizziness and giddiness: Secondary | ICD-10-CM | POA: Insufficient documentation

## 2024-05-10 LAB — CBC
HCT: 36.7 % — ABNORMAL LOW (ref 39.0–52.0)
Hemoglobin: 12.1 g/dL — ABNORMAL LOW (ref 13.0–17.0)
MCH: 31.8 pg (ref 26.0–34.0)
MCHC: 33 g/dL (ref 30.0–36.0)
MCV: 96.3 fL (ref 80.0–100.0)
Platelets: 220 K/uL (ref 150–400)
RBC: 3.81 MIL/uL — ABNORMAL LOW (ref 4.22–5.81)
RDW: 14.6 % (ref 11.5–15.5)
WBC: 11.9 K/uL — ABNORMAL HIGH (ref 4.0–10.5)
nRBC: 0 % (ref 0.0–0.2)

## 2024-05-10 LAB — COMPREHENSIVE METABOLIC PANEL WITH GFR
ALT: 17 U/L (ref 0–44)
AST: 25 U/L (ref 15–41)
Albumin: 4.3 g/dL (ref 3.5–5.0)
Alkaline Phosphatase: 88 U/L (ref 38–126)
Anion gap: 12 (ref 5–15)
BUN: 33 mg/dL — ABNORMAL HIGH (ref 8–23)
CO2: 22 mmol/L (ref 22–32)
Calcium: 9.9 mg/dL (ref 8.9–10.3)
Chloride: 105 mmol/L (ref 98–111)
Creatinine, Ser: 1.07 mg/dL (ref 0.61–1.24)
GFR, Estimated: >60 mL/min (ref >60)
Glucose, Bld: 143 mg/dL — ABNORMAL HIGH (ref 70–99)
Potassium: 4.1 mmol/L (ref 3.5–5.1)
Sodium: 138 mmol/L (ref 135–145)
Total Bilirubin: 0.7 mg/dL (ref 0.0–1.2)
Total Protein: 7.5 g/dL (ref 6.5–8.1)

## 2024-05-10 LAB — URINALYSIS, ROUTINE W REFLEX MICROSCOPIC
Bilirubin Urine: NEGATIVE
Glucose, UA: NEGATIVE mg/dL
Hgb urine dipstick: NEGATIVE
Ketones, ur: 5 mg/dL — AB
Nitrite: POSITIVE — AB
Protein, ur: NEGATIVE mg/dL
Specific Gravity, Urine: 1.014 (ref 1.005–1.030)
pH: 5 (ref 5.0–8.0)

## 2024-05-10 LAB — TROPONIN T, HIGH SENSITIVITY
Troponin T High Sensitivity: 25 ng/L — ABNORMAL HIGH (ref 0–19)
Troponin T High Sensitivity: 24 ng/L — ABNORMAL HIGH (ref 0–19)

## 2024-05-10 LAB — LIPASE, BLOOD: Lipase: 21 U/L (ref 11–51)

## 2024-05-10 MED ORDER — LACTATED RINGERS IV BOLUS
1000.0000 mL | Freq: Once | INTRAVENOUS | Status: AC
  Administered 2024-05-10: 1000 mL via INTRAVENOUS

## 2024-05-10 MED ORDER — SODIUM CHLORIDE 0.9 % IV SOLN
1.0000 g | Freq: Once | INTRAVENOUS | Status: AC
  Administered 2024-05-10: 1 g via INTRAVENOUS
  Filled 2024-05-10: qty 10

## 2024-05-10 MED ORDER — CEFUROXIME AXETIL 250 MG PO TABS: 250.0000 mg | ORAL_TABLET | Freq: Two times a day (BID) | ORAL | 0 refills | Status: AC

## 2024-05-10 MED ORDER — ONDANSETRON HCL 4 MG/2ML IJ SOLN
4.0000 mg | Freq: Once | INTRAMUSCULAR | Status: AC | PRN
  Administered 2024-05-10: 4 mg via INTRAVENOUS
  Filled 2024-05-10: qty 2

## 2024-05-10 NOTE — ED Provider Notes (Signed)
 [facility] Provider Note    Event Date/Time   First MD Initiated Contact with Patient 05/10/24 1506     (approximate)   History   Emesis   HPI  Grant Schmitt is a 89 y.o. male who presents to the ED for evaluation of Emesis   Review of PCP visit from July.  History of HTN, HLD, BPH, CKD.  Recently started on Xarelto  for A-fib  Patient presents to the ED from home via POV with his son and daughter-in-law for evaluation of multiple complaints over the past few days.  Patient lives at home by himself, family lives 30-40 minutes away.  Mohs procedure to his left forearm on Monday, had bleeding from the incision on Wednesday briefly that has since stopped.   This morning after getting up from bed and walking to the toilet to void he developed dizziness and reports a fall in the bathroom without syncope or significant injury.  After this he felt wobbly and somewhat confused so he called his son.  They found him at home, pale and somewhat confused.  They bring him to the ED and he had 1 episode of emesis in our lobby without abdominal pain, chest pain.  By the time to get back to a room he reports feeling much better and back to normal, family knowledges he seems normal now.    Physical Exam   Triage Vital Signs: ED Triage Vitals  Encounter Vitals Group     BP 05/10/24 1242 (!) 164/84     Girls Systolic BP Percentile --      Girls Diastolic BP Percentile --      Boys Systolic BP Percentile --      Boys Diastolic BP Percentile --      Pulse Rate 05/10/24 1242 72     Resp 05/10/24 1242 20     Temp 05/10/24 1246 98.3 F (36.8 C)     Temp Source 05/10/24 1246 Oral     SpO2 05/10/24 1242 97 %     Weight --      Height --      Head Circumference --      Peak Flow --      Pain Score 05/10/24 1242 0     Pain Loc --      Pain Education --      Exclude from Growth Chart --     Most recent vital signs: Vitals:   05/10/24 1750 05/10/24 1900  BP: (!)  154/79 (!) 165/68  Pulse: 69 86  Resp: 14 15  Temp:    SpO2: 100% 100%    General: Awake, no distress.  CV:  Good peripheral perfusion.  Resp:  Normal effort.  Abd:  No distention.  Soft and nontender MSK:  No deformity noted.  Well-approximated surgical site to left dorsal forearm without signs of superimposed infection or bleeding, no dehiscence No significant signs of trauma Neuro:  No focal deficits appreciated. Other:     ED Results / Procedures / Treatments   Labs (all labs ordered are listed, but only abnormal results are displayed) Labs Reviewed  COMPREHENSIVE METABOLIC PANEL WITH GFR - Abnormal; Notable for the following components:      Result Value   Glucose, Bld 143 (*)    BUN 33 (*)    All other components within normal limits  CBC - Abnormal; Notable for the following components:   WBC 11.9 (*)    RBC 3.81 (*)  Hemoglobin 12.1 (*)    HCT 36.7 (*)    All other components within normal limits  URINALYSIS, ROUTINE W REFLEX MICROSCOPIC - Abnormal; Notable for the following components:   Color, Urine YELLOW (*)    APPearance HAZY (*)    Ketones, ur 5 (*)    Nitrite POSITIVE (*)    Leukocytes,Ua TRACE (*)    Bacteria, UA RARE (*)    All other components within normal limits  TROPONIN T, HIGH SENSITIVITY - Abnormal; Notable for the following components:   Troponin T High Sensitivity 25 (*)    All other components within normal limits  TROPONIN T, HIGH SENSITIVITY - Abnormal; Notable for the following components:   Troponin T High Sensitivity 24 (*)    All other components within normal limits  URINE CULTURE  LIPASE, BLOOD    EKG   RADIOLOGY CT head interpreted by me without evidence of acute intracranial pathology CT cervical spine interpreted by me without evidence of fracture or dislocation  Official radiology report(s): CT HEAD WO CONTRAST ( ) Result Date: 05/10/2024 EXAM: CT HEAD AND CERVICAL SPINE 05/10/2024 03:55:00 PM TECHNIQUE: CT of the  head and cervical spine was performed without the administration of intravenous contrast. Multiplanar reformatted images are provided for review. Automated exposure control, iterative reconstruction, and/or weight based adjustment of the mA/kV was utilized to reduce the radiation dose to as low as reasonably achievable. COMPARISON: None available. CLINICAL HISTORY: fall on AC, confusion. eval ich FINDINGS: CT HEAD BRAIN AND VENTRICLES: No acute intracranial hemorrhage. No mass effect or midline shift. No abnormal extra-axial fluid collection. No evidence of acute infarct. No hydrocephalus. ORBITS: No acute abnormality. SINUSES AND MASTOIDS: No acute abnormality. SOFT TISSUES AND SKULL: No acute skull fracture. No acute soft tissue abnormality. CT CERVICAL SPINE BONES AND ALIGNMENT: No acute fracture or traumatic malalignment. Degenerative grade 1 anterolisthesis of C4 on C5. DEGENERATIVE CHANGES: No significant degenerative changes. SOFT TISSUES: No prevertebral soft tissue swelling. IMPRESSION: 1. No acute intracranial abnormality. 2. No acute fracture or traumatic malalignment of the cervical spine. 3. Severe degenerative changes in the cervical spine. Electronically signed by: Gilmore Molt MD 05/10/2024 04:21 PM EST RP Workstation: [id number]   CT Cervical Spine Wo Contrast Result Date: 05/10/2024 EXAM: CT HEAD AND CERVICAL SPINE 05/10/2024 03:55:00 PM TECHNIQUE: CT of the head and cervical spine was performed without the administration of intravenous contrast. Multiplanar reformatted images are provided for review. Automated exposure control, iterative reconstruction, and/or weight based adjustment of the mA/kV was utilized to reduce the radiation dose to as low as reasonably achievable. COMPARISON: None available. CLINICAL HISTORY: fall on AC, confusion. eval ich FINDINGS: CT HEAD BRAIN AND VENTRICLES: No acute intracranial hemorrhage. No mass effect or midline shift. No abnormal extra-axial fluid  collection. No evidence of acute infarct. No hydrocephalus. ORBITS: No acute abnormality. SINUSES AND MASTOIDS: No acute abnormality. SOFT TISSUES AND SKULL: No acute skull fracture. No acute soft tissue abnormality. CT CERVICAL SPINE BONES AND ALIGNMENT: No acute fracture or traumatic malalignment. Degenerative grade 1 anterolisthesis of C4 on C5. DEGENERATIVE CHANGES: No significant degenerative changes. SOFT TISSUES: No prevertebral soft tissue swelling. IMPRESSION: 1. No acute intracranial abnormality. 2. No acute fracture or traumatic malalignment of the cervical spine. 3. Severe degenerative changes in the cervical spine. Electronically signed by: Gilmore Molt MD 05/10/2024 04:21 PM EST RP Workstation: [id number]    PROCEDURES and INTERVENTIONS:  Procedures  Medications  ondansetron  (ZOFRAN ) injection 4 mg (4 mg Intravenous Given 05/10/24 1247)  lactated  ringers  bolus 1,000 mL (1,000 mLs Intravenous New Bag/Given 05/10/24 1536)  cefTRIAXone  (ROCEPHIN ) 1 g in sodium chloride  0.9 % 100 mL IVPB (1 g Intravenous New Bag/Given 05/10/24 1900)     IMPRESSION / MDM / ASSESSMENT AND PLAN / ED COURSE  I reviewed the triage vital signs and the nursing notes.  Differential diagnosis includes, but is not limited to, stroke, sepsis, UTI, ICH, blood loss anemia, AKI, seizure  {Patient presents with symptoms of an acute illness or injury that is potentially life-threatening.  Patient presents with an episode of emesis superimposed on a couple days of waxing and waning presyncope and weakness, signs of UTI and suitable for outpatient management.  Reassuring vital signs and exam.  Marginal leukocytosis but no other SIRS criteria.  No significant metabolic derangements.  Urine is infectious and sent for culture.  History of BPH.  No concerns for retention at this point.  Start antibiotics.  I considered admission for this patient but believe he be suitable for trial of outpatient management.  Family is  agreeable.  Discharged with return precautions.      FINAL CLINICAL IMPRESSION(S) / ED DIAGNOSES   Final diagnoses:  Nausea and vomiting, unspecified vomiting type  Acute cystitis without hematuria     Rx / DC Orders   ED Discharge Orders          Ordered    cefUROXime  (CEFTIN ) 250 MG tablet  2 times daily with meals        05/10/24 1919             Note:  This document was prepared using Dragon voice recognition software and may include unintentional dictation errors.   Claudene Rover, MD 05/10/24 [phone]

## 2024-05-10 NOTE — Discharge Instructions (Signed)
 Cefuroxime /Ceftin  antibiotics twice daily for 1 week  Follow-up with your PCP  Return to the ED with any worsening symptoms despite these measures

## 2024-05-10 NOTE — ED Triage Notes (Signed)
 Patient arrives in wheelchair by POV c/o dizziness and emesis. Patient had Mohs chemosurgery and family states he lost a lot of blood. Took patient to [place] yesterday states they had labs drawn and CT head and sent home. Patient reports having a difficult time focusing on things. Denies any pain.

## 2024-05-13 LAB — URINE CULTURE: Culture: >=100000 — AB

## 2024-05-19 ENCOUNTER — Other Ambulatory Visit: Payer: Self-pay | Admitting: Family Medicine

## 2024-05-19 DIAGNOSIS — N400 Enlarged prostate without lower urinary tract symptoms: Secondary | ICD-10-CM

## 2024-05-20 ENCOUNTER — Ambulatory Visit
Admission: RE | Admit: 2024-05-20 | Discharge: 2024-05-20 | Disposition: A | Discharge status: Home / Self Care | Source: Ambulatory Visit | Attending: Radiation Oncology | Admitting: Radiation Oncology

## 2024-05-20 DIAGNOSIS — C44229 Squamous cell carcinoma of skin of left ear and external auricular canal: Secondary | ICD-10-CM | POA: Insufficient documentation

## 2024-05-20 DIAGNOSIS — Z510 Encounter for antineoplastic radiation therapy: Secondary | ICD-10-CM | POA: Diagnosis present

## 2024-05-27 ENCOUNTER — Ambulatory Visit
Admission: RE | Admit: 2024-05-27 | Discharge: 2024-05-27 | Disposition: A | Source: Ambulatory Visit | Attending: Radiation Oncology | Admitting: Radiation Oncology

## 2024-05-27 ENCOUNTER — Other Ambulatory Visit: Payer: Self-pay

## 2024-05-27 DIAGNOSIS — Z510 Encounter for antineoplastic radiation therapy: Secondary | ICD-10-CM | POA: Diagnosis not present

## 2024-05-27 LAB — RAD ONC ARIA SESSION SUMMARY
Course Elapsed Days: 0
Plan Fractions Treated to Date: 1
Plan Prescribed Dose Per Fraction: 2 Gy
Plan Total Fractions Prescribed: 20
Plan Total Prescribed Dose: 40 Gy
Reference Point Dosage Given to Date: 2 Gy
Reference Point Session Dosage Given: 2 Gy
Session Number: 1

## 2024-05-28 ENCOUNTER — Other Ambulatory Visit: Payer: Self-pay

## 2024-05-28 ENCOUNTER — Ambulatory Visit
Admission: RE | Admit: 2024-05-28 | Discharge: 2024-05-28 | Disposition: A | Discharge status: Home / Self Care | Source: Ambulatory Visit | Attending: Radiation Oncology | Admitting: Radiation Oncology

## 2024-05-28 DIAGNOSIS — Z510 Encounter for antineoplastic radiation therapy: Secondary | ICD-10-CM | POA: Diagnosis not present

## 2024-05-28 LAB — RAD ONC ARIA SESSION SUMMARY
Course Elapsed Days: 1
Plan Fractions Treated to Date: 2
Plan Prescribed Dose Per Fraction: 2 Gy
Plan Total Fractions Prescribed: 20
Plan Total Prescribed Dose: 40 Gy
Reference Point Dosage Given to Date: 4 Gy
Reference Point Session Dosage Given: 2 Gy
Session Number: 2

## 2024-05-29 ENCOUNTER — Other Ambulatory Visit: Payer: Self-pay

## 2024-05-29 ENCOUNTER — Ambulatory Visit
Admission: RE | Admit: 2024-05-29 | Discharge: 2024-05-29 | Disposition: A | Discharge status: Home / Self Care | Source: Ambulatory Visit | Attending: Radiation Oncology | Admitting: Radiation Oncology

## 2024-05-29 DIAGNOSIS — Z510 Encounter for antineoplastic radiation therapy: Secondary | ICD-10-CM | POA: Diagnosis not present

## 2024-05-29 LAB — RAD ONC ARIA SESSION SUMMARY
Course Elapsed Days: 2
Plan Fractions Treated to Date: 3
Plan Prescribed Dose Per Fraction: 2 Gy
Plan Total Fractions Prescribed: 20
Plan Total Prescribed Dose: 40 Gy
Reference Point Dosage Given to Date: 6 Gy
Reference Point Session Dosage Given: 2 Gy
Session Number: 3

## 2024-05-30 ENCOUNTER — Other Ambulatory Visit: Payer: Self-pay

## 2024-05-30 ENCOUNTER — Ambulatory Visit
Admission: RE | Admit: 2024-05-30 | Discharge: 2024-05-30 | Disposition: A | Discharge status: Home / Self Care | Source: Ambulatory Visit | Attending: Radiation Oncology | Admitting: Radiation Oncology

## 2024-05-30 DIAGNOSIS — Z510 Encounter for antineoplastic radiation therapy: Secondary | ICD-10-CM | POA: Diagnosis not present

## 2024-05-30 LAB — RAD ONC ARIA SESSION SUMMARY
Course Elapsed Days: 3
Plan Fractions Treated to Date: 4
Plan Prescribed Dose Per Fraction: 2 Gy
Plan Total Fractions Prescribed: 20
Plan Total Prescribed Dose: 40 Gy
Reference Point Dosage Given to Date: 8 Gy
Reference Point Session Dosage Given: 2 Gy
Session Number: 4

## 2024-06-02 ENCOUNTER — Ambulatory Visit
Admission: RE | Admit: 2024-06-02 | Discharge: 2024-06-02 | Disposition: A | Discharge status: Home / Self Care | Source: Ambulatory Visit | Attending: Radiation Oncology | Admitting: Radiation Oncology

## 2024-06-02 ENCOUNTER — Other Ambulatory Visit: Payer: Self-pay

## 2024-06-02 DIAGNOSIS — Z510 Encounter for antineoplastic radiation therapy: Secondary | ICD-10-CM | POA: Diagnosis not present

## 2024-06-02 LAB — RAD ONC ARIA SESSION SUMMARY
Course Elapsed Days: 6
Plan Fractions Treated to Date: 5
Plan Prescribed Dose Per Fraction: 2 Gy
Plan Total Fractions Prescribed: 20
Plan Total Prescribed Dose: 40 Gy
Reference Point Dosage Given to Date: 10 Gy
Reference Point Session Dosage Given: 2 Gy
Session Number: 5

## 2024-06-03 ENCOUNTER — Ambulatory Visit
Admission: RE | Admit: 2024-06-03 | Discharge: 2024-06-03 | Disposition: A | Source: Ambulatory Visit | Attending: Radiation Oncology | Admitting: Radiation Oncology

## 2024-06-03 ENCOUNTER — Other Ambulatory Visit: Payer: Self-pay

## 2024-06-03 DIAGNOSIS — Z510 Encounter for antineoplastic radiation therapy: Secondary | ICD-10-CM | POA: Diagnosis not present

## 2024-06-03 LAB — RAD ONC ARIA SESSION SUMMARY
Course Elapsed Days: 7
Plan Fractions Treated to Date: 6
Plan Prescribed Dose Per Fraction: 2 Gy
Plan Total Fractions Prescribed: 20
Plan Total Prescribed Dose: 40 Gy
Reference Point Dosage Given to Date: 12 Gy
Reference Point Session Dosage Given: 2 Gy
Session Number: 6

## 2024-06-04 ENCOUNTER — Ambulatory Visit
Admission: RE | Admit: 2024-06-04 | Discharge: 2024-06-04 | Attending: Radiation Oncology | Admitting: Radiation Oncology

## 2024-06-04 ENCOUNTER — Other Ambulatory Visit: Payer: Self-pay

## 2024-06-04 DIAGNOSIS — Z510 Encounter for antineoplastic radiation therapy: Secondary | ICD-10-CM | POA: Diagnosis not present

## 2024-06-04 LAB — RAD ONC ARIA SESSION SUMMARY
Course Elapsed Days: 8
Plan Fractions Treated to Date: 7
Plan Prescribed Dose Per Fraction: 2 Gy
Plan Total Fractions Prescribed: 20
Plan Total Prescribed Dose: 40 Gy
Reference Point Dosage Given to Date: 14 Gy
Reference Point Session Dosage Given: 2 Gy
Session Number: 7

## 2024-06-05 ENCOUNTER — Ambulatory Visit
Admission: RE | Admit: 2024-06-05 | Discharge: 2024-06-05 | Attending: Radiation Oncology | Admitting: Radiation Oncology

## 2024-06-05 ENCOUNTER — Other Ambulatory Visit: Payer: Self-pay

## 2024-06-05 DIAGNOSIS — Z510 Encounter for antineoplastic radiation therapy: Secondary | ICD-10-CM | POA: Diagnosis not present

## 2024-06-05 LAB — RAD ONC ARIA SESSION SUMMARY
Course Elapsed Days: 9
Plan Fractions Treated to Date: 8
Plan Prescribed Dose Per Fraction: 2 Gy
Plan Total Fractions Prescribed: 20
Plan Total Prescribed Dose: 40 Gy
Reference Point Dosage Given to Date: 16 Gy
Reference Point Session Dosage Given: 2 Gy
Session Number: 8

## 2024-06-06 ENCOUNTER — Other Ambulatory Visit: Payer: Self-pay

## 2024-06-06 ENCOUNTER — Ambulatory Visit
Admission: RE | Admit: 2024-06-06 | Discharge: 2024-06-06 | Attending: Radiation Oncology | Admitting: Radiation Oncology

## 2024-06-06 DIAGNOSIS — Z510 Encounter for antineoplastic radiation therapy: Secondary | ICD-10-CM | POA: Diagnosis not present

## 2024-06-06 LAB — RAD ONC ARIA SESSION SUMMARY
Course Elapsed Days: 10
Plan Fractions Treated to Date: 9
Plan Prescribed Dose Per Fraction: 2 Gy
Plan Total Fractions Prescribed: 20
Plan Total Prescribed Dose: 40 Gy
Reference Point Dosage Given to Date: 18 Gy
Reference Point Session Dosage Given: 2 Gy
Session Number: 9

## 2024-06-09 ENCOUNTER — Other Ambulatory Visit: Payer: Self-pay

## 2024-06-09 ENCOUNTER — Ambulatory Visit
Admission: RE | Admit: 2024-06-09 | Discharge: 2024-06-09 | Disposition: A | Discharge status: Home / Self Care | Source: Ambulatory Visit | Attending: Radiation Oncology | Admitting: Radiation Oncology

## 2024-06-09 DIAGNOSIS — Z510 Encounter for antineoplastic radiation therapy: Secondary | ICD-10-CM | POA: Diagnosis not present

## 2024-06-09 LAB — RAD ONC ARIA SESSION SUMMARY
Course Elapsed Days: 13
Plan Fractions Treated to Date: 10
Plan Prescribed Dose Per Fraction: 2 Gy
Plan Total Fractions Prescribed: 20
Plan Total Prescribed Dose: 40 Gy
Reference Point Dosage Given to Date: 20 Gy
Reference Point Session Dosage Given: 2 Gy
Session Number: 10

## 2024-06-10 ENCOUNTER — Ambulatory Visit
Admission: RE | Admit: 2024-06-10 | Discharge: 2024-06-10 | Disposition: A | Discharge status: Home / Self Care | Source: Ambulatory Visit | Attending: Radiation Oncology | Admitting: Radiation Oncology

## 2024-06-10 ENCOUNTER — Other Ambulatory Visit: Payer: Self-pay

## 2024-06-10 DIAGNOSIS — Z510 Encounter for antineoplastic radiation therapy: Secondary | ICD-10-CM | POA: Diagnosis not present

## 2024-06-10 LAB — RAD ONC ARIA SESSION SUMMARY
Course Elapsed Days: 14
Plan Fractions Treated to Date: 11
Plan Prescribed Dose Per Fraction: 2 Gy
Plan Total Fractions Prescribed: 20
Plan Total Prescribed Dose: 40 Gy
Reference Point Dosage Given to Date: 22 Gy
Reference Point Session Dosage Given: 2 Gy
Session Number: 11

## 2024-06-11 ENCOUNTER — Ambulatory Visit
Admission: RE | Admit: 2024-06-11 | Discharge: 2024-06-11 | Disposition: A | Discharge status: Home / Self Care | Source: Ambulatory Visit | Attending: Radiation Oncology | Admitting: Radiation Oncology

## 2024-06-11 ENCOUNTER — Other Ambulatory Visit: Payer: Self-pay

## 2024-06-11 DIAGNOSIS — Z510 Encounter for antineoplastic radiation therapy: Secondary | ICD-10-CM | POA: Diagnosis not present

## 2024-06-11 LAB — RAD ONC ARIA SESSION SUMMARY
Course Elapsed Days: 15
Plan Fractions Treated to Date: 12
Plan Prescribed Dose Per Fraction: 2 Gy
Plan Total Fractions Prescribed: 20
Plan Total Prescribed Dose: 40 Gy
Reference Point Dosage Given to Date: 24 Gy
Reference Point Session Dosage Given: 2 Gy
Session Number: 12

## 2024-06-16 ENCOUNTER — Ambulatory Visit
Admission: RE | Admit: 2024-06-16 | Discharge: 2024-06-16 | Disposition: A | Discharge status: Home / Self Care | Source: Ambulatory Visit | Attending: Radiation Oncology | Admitting: Radiation Oncology

## 2024-06-16 ENCOUNTER — Other Ambulatory Visit: Payer: Self-pay

## 2024-06-16 DIAGNOSIS — Z510 Encounter for antineoplastic radiation therapy: Secondary | ICD-10-CM | POA: Diagnosis not present

## 2024-06-16 LAB — RAD ONC ARIA SESSION SUMMARY
Course Elapsed Days: 20
Plan Fractions Treated to Date: 13
Plan Prescribed Dose Per Fraction: 2 Gy
Plan Total Fractions Prescribed: 20
Plan Total Prescribed Dose: 40 Gy
Reference Point Dosage Given to Date: 26 Gy
Reference Point Session Dosage Given: 2 Gy
Session Number: 13

## 2024-06-17 ENCOUNTER — Ambulatory Visit
Admission: RE | Admit: 2024-06-17 | Discharge: 2024-06-17 | Disposition: A | Discharge status: Home / Self Care | Source: Ambulatory Visit | Attending: Radiation Oncology | Admitting: Radiation Oncology

## 2024-06-17 ENCOUNTER — Other Ambulatory Visit: Payer: Self-pay

## 2024-06-17 DIAGNOSIS — Z510 Encounter for antineoplastic radiation therapy: Secondary | ICD-10-CM | POA: Diagnosis not present

## 2024-06-17 LAB — RAD ONC ARIA SESSION SUMMARY
Course Elapsed Days: 21
Plan Fractions Treated to Date: 14
Plan Prescribed Dose Per Fraction: 2 Gy
Plan Total Fractions Prescribed: 20
Plan Total Prescribed Dose: 40 Gy
Reference Point Dosage Given to Date: 28 Gy
Reference Point Session Dosage Given: 2 Gy
Session Number: 14

## 2024-06-18 ENCOUNTER — Other Ambulatory Visit: Payer: Self-pay

## 2024-06-18 ENCOUNTER — Other Ambulatory Visit (HOSPITAL_COMMUNITY): Payer: Self-pay

## 2024-06-18 ENCOUNTER — Ambulatory Visit
Admission: RE | Admit: 2024-06-18 | Discharge: 2024-06-18 | Disposition: A | Discharge status: Home / Self Care | Source: Ambulatory Visit | Attending: Radiation Oncology | Admitting: Radiation Oncology

## 2024-06-18 DIAGNOSIS — Z510 Encounter for antineoplastic radiation therapy: Secondary | ICD-10-CM | POA: Diagnosis not present

## 2024-06-18 LAB — RAD ONC ARIA SESSION SUMMARY
Course Elapsed Days: 22
Plan Fractions Treated to Date: 15
Plan Prescribed Dose Per Fraction: 2 Gy
Plan Total Fractions Prescribed: 20
Plan Total Prescribed Dose: 40 Gy
Reference Point Dosage Given to Date: 30 Gy
Reference Point Session Dosage Given: 2 Gy
Session Number: 15

## 2024-06-20 ENCOUNTER — Other Ambulatory Visit (HOSPITAL_COMMUNITY): Payer: Self-pay

## 2024-06-20 ENCOUNTER — Ambulatory Visit
Admission: RE | Admit: 2024-06-20 | Discharge: 2024-06-20 | Disposition: A | Discharge status: Home / Self Care | Source: Ambulatory Visit | Attending: Radiation Oncology | Admitting: Radiation Oncology

## 2024-06-20 ENCOUNTER — Other Ambulatory Visit: Payer: Self-pay

## 2024-06-20 DIAGNOSIS — C44229 Squamous cell carcinoma of skin of left ear and external auricular canal: Secondary | ICD-10-CM | POA: Insufficient documentation

## 2024-06-20 DIAGNOSIS — Z510 Encounter for antineoplastic radiation therapy: Secondary | ICD-10-CM | POA: Insufficient documentation

## 2024-06-20 LAB — RAD ONC ARIA SESSION SUMMARY
Course Elapsed Days: 24
Plan Fractions Treated to Date: 16
Plan Prescribed Dose Per Fraction: 2 Gy
Plan Total Fractions Prescribed: 20
Plan Total Prescribed Dose: 40 Gy
Reference Point Dosage Given to Date: 32 Gy
Reference Point Session Dosage Given: 2 Gy
Session Number: 16

## 2024-06-23 ENCOUNTER — Ambulatory Visit
Admission: RE | Admit: 2024-06-23 | Discharge: 2024-06-23 | Disposition: A | Discharge status: Home / Self Care | Source: Ambulatory Visit | Attending: Radiation Oncology | Admitting: Radiation Oncology

## 2024-06-23 ENCOUNTER — Other Ambulatory Visit: Payer: Self-pay

## 2024-06-23 LAB — RAD ONC ARIA SESSION SUMMARY
Course Elapsed Days: 27
Plan Fractions Treated to Date: 17
Plan Prescribed Dose Per Fraction: 2 Gy
Plan Total Fractions Prescribed: 20
Plan Total Prescribed Dose: 40 Gy
Reference Point Dosage Given to Date: 34 Gy
Reference Point Session Dosage Given: 2 Gy
Session Number: 17

## 2024-06-24 ENCOUNTER — Other Ambulatory Visit: Payer: Self-pay

## 2024-06-24 ENCOUNTER — Ambulatory Visit
Admission: RE | Admit: 2024-06-24 | Discharge: 2024-06-24 | Disposition: A | Discharge status: Home / Self Care | Source: Ambulatory Visit | Attending: Radiation Oncology | Admitting: Radiation Oncology

## 2024-06-24 ENCOUNTER — Other Ambulatory Visit (HOSPITAL_COMMUNITY): Payer: Self-pay

## 2024-06-24 LAB — RAD ONC ARIA SESSION SUMMARY
Course Elapsed Days: 28
Plan Fractions Treated to Date: 18
Plan Prescribed Dose Per Fraction: 2 Gy
Plan Total Fractions Prescribed: 20
Plan Total Prescribed Dose: 40 Gy
Reference Point Dosage Given to Date: 36 Gy
Reference Point Session Dosage Given: 2 Gy
Session Number: 18

## 2024-06-25 ENCOUNTER — Ambulatory Visit
Admission: RE | Admit: 2024-06-25 | Discharge: 2024-06-25 | Disposition: A | Discharge status: Home / Self Care | Source: Ambulatory Visit | Attending: Radiation Oncology | Admitting: Radiation Oncology

## 2024-06-25 ENCOUNTER — Other Ambulatory Visit: Payer: Self-pay

## 2024-06-25 LAB — RAD ONC ARIA SESSION SUMMARY
Course Elapsed Days: 29
Plan Fractions Treated to Date: 19
Plan Prescribed Dose Per Fraction: 2 Gy
Plan Total Fractions Prescribed: 20
Plan Total Prescribed Dose: 40 Gy
Reference Point Dosage Given to Date: 38 Gy
Reference Point Session Dosage Given: 2 Gy
Session Number: 19

## 2024-06-26 ENCOUNTER — Other Ambulatory Visit: Payer: Self-pay

## 2024-06-26 ENCOUNTER — Ambulatory Visit
Admission: RE | Admit: 2024-06-26 | Discharge: 2024-06-26 | Disposition: A | Discharge status: Home / Self Care | Source: Ambulatory Visit | Attending: Radiation Oncology | Admitting: Radiation Oncology

## 2024-06-26 LAB — RAD ONC ARIA SESSION SUMMARY
Course Elapsed Days: 30
Plan Fractions Treated to Date: 20
Plan Prescribed Dose Per Fraction: 2 Gy
Plan Total Fractions Prescribed: 20
Plan Total Prescribed Dose: 40 Gy
Reference Point Dosage Given to Date: 40 Gy
Reference Point Session Dosage Given: 2 Gy
Session Number: 20

## 2024-06-27 NOTE — Radiation Completion Notes (Signed)
 Patient Name: Grant Schmitt, Grant Schmitt MRN: [id number] Date of Birth: Nov 25, 1928 Referring Physician: NANCYANN PERRY, M.D. Date of Service: 2024-06-27 Radiation Oncologist: Marcey Penton, M.D. [facility]                             RADIATION ONCOLOGY END OF TREATMENT NOTE     Diagnosis: H61.91 Disorder of right external ear, unspecified Intent: Curative     HPI: Patient is a 89 year old male established patient of Dr. Gladis who presented with a mass on the left cymba concha.  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  Perineural invasion was identified focally.  Based on the location and extent of its excision he is referred to radiation oncology for consideration of treatment.  He is asymptomatic.  Does have another left forearm lesion which is scheduled for excision in the near future by dermatology.      ==========DELIVERED PLANS==========  First Treatment Date: 2024-05-27 Last Treatment Date: 2024-06-26   Plan Name: [id number] Site: [facility], Left Technique: Electron Mode: Electron Dose Per Fraction: 2 Gy Prescribed Dose (Delivered / Prescribed): 40 Gy / 40 Gy Prescribed Fxs (Delivered / Prescribed): 20 / 20     ==========ON TREATMENT VISIT DATES========== 2024-05-27, 2024-06-03, 2024-06-10, 2024-06-17, 2024-06-24     ==========UPCOMING VISITS========== 07/30/2024 [facility] RAD ONCOLOGY FOLLOW UP 30 Chrystal, Glenn, MD        ==========APPENDIX - ON TREATMENT VISIT NOTES==========   See weekly On Treatment Notes in Epic for details in the Media tab (listed as Progress notes on the On Treatment Visit Dates listed above).

## 2024-07-04 ENCOUNTER — Other Ambulatory Visit (HOSPITAL_COMMUNITY): Payer: Self-pay

## 2024-07-04 ENCOUNTER — Other Ambulatory Visit: Payer: Self-pay | Admitting: Family Medicine

## 2024-07-04 DIAGNOSIS — N400 Enlarged prostate without lower urinary tract symptoms: Secondary | ICD-10-CM

## 2024-07-04 MED ORDER — TAMSULOSIN HCL 0.4 MG PO CAPS: 0.4000 mg | ORAL_CAPSULE | Freq: Every day | ORAL | 0 refills | Status: AC

## 2024-07-04 NOTE — Telephone Encounter (Signed)
 Copied from CRM [id number]. Topic: Clinical - Medication Refill >> Jul 04, 2024 10:22 AM Joesph NOVAK wrote: Medication:  tamsulosin  (FLOMAX ) 0.4 MG CAPS capsule    Has the patient contacted their pharmacy? Yes (Agent: If no, request that the patient contact the pharmacy for the refill. If patient does not wish to contact the pharmacy document the reason why and proceed with request.) (Agent: If yes, when and what did the pharmacy advise?)  This is the patient's preferred pharmacy:  [facility] [address], [place] - [address] [place] [address] Phone: [phone] Fax: [phone]   Is this the correct pharmacy for this prescription? Yes If no, delete pharmacy and type the correct one.   Has the prescription been filled recently? Yes  Is the patient out of the medication? Yes  Has the patient been seen for an appointment in the last year OR does the patient have an upcoming appointment? Yes  Can we respond through MyChart? Yes  Agent: Please be advised that Rx refills may take up to 3 business days. We ask that you follow-up with your pharmacy.

## 2024-07-04 NOTE — Telephone Encounter (Signed)
 Requested Prescriptions  Pending Prescriptions Disp Refills   tamsulosin  (FLOMAX ) 0.4 MG CAPS capsule 100 capsule 0    Sig: Take 1 capsule (0.4 mg total) by mouth daily.     Urology: Alpha-Adrenergic Blocker Failed - 07/04/2024  1:47 PM      Failed - Last BP in normal range    BP Readings from Last 1 Encounters:  05/10/24 (!) 145/86         Passed - PSA in normal range and within 360 days    Prostate Specific Ag, Serum  Date Value Ref Range Status  09/26/2023 0.7 0.0 - 4.0 ng/mL Final    Comment:    Roche ECLIA methodology. According to the American Urological Association, Serum PSA should decrease and remain at undetectable levels after radical prostatectomy. The AUA defines biochemical recurrence as an initial PSA value 0.2 ng/mL or greater followed by a subsequent confirmatory PSA value 0.2 ng/mL or greater. Values obtained with different assay methods or kits cannot be used interchangeably. Results cannot be interpreted as absolute evidence of the presence or absence of malignant disease.          Passed - Valid encounter within last 12 months    Recent Outpatient Visits           6 months ago Benign prostatic hyperplasia with lower urinary tract symptoms, symptom details unspecified   [facility] [place] Nancyann BRAVO, MD   9 months ago Primary hypertension   [facility] [place] Nancyann BRAVO, MD

## 2024-07-30 ENCOUNTER — Ambulatory Visit: Admitting: Radiation Oncology
